# Patient Record
Sex: Female | Born: 1950
Health system: Southern US, Community
[De-identification: ages and names within clinical notes are randomized; demographics above are authoritative.]

## PROBLEM LIST (undated history)

## (undated) DIAGNOSIS — T8859XA Other complications of anesthesia, initial encounter: Secondary | ICD-10-CM

## (undated) DIAGNOSIS — IMO0002 Reserved for concepts with insufficient information to code with codable children: Secondary | ICD-10-CM

## (undated) DIAGNOSIS — J302 Other seasonal allergic rhinitis: Secondary | ICD-10-CM

## (undated) DIAGNOSIS — K219 Gastro-esophageal reflux disease without esophagitis: Secondary | ICD-10-CM

## (undated) DIAGNOSIS — S63509A Unspecified sprain of unspecified wrist, initial encounter: Secondary | ICD-10-CM

## (undated) DIAGNOSIS — E785 Hyperlipidemia, unspecified: Secondary | ICD-10-CM

## (undated) DIAGNOSIS — H269 Unspecified cataract: Secondary | ICD-10-CM

## (undated) DIAGNOSIS — G4730 Sleep apnea, unspecified: Secondary | ICD-10-CM

## (undated) DIAGNOSIS — M8580 Other specified disorders of bone density and structure, unspecified site: Secondary | ICD-10-CM

## (undated) DIAGNOSIS — M1990 Unspecified osteoarthritis, unspecified site: Secondary | ICD-10-CM

## (undated) DIAGNOSIS — E739 Lactose intolerance, unspecified: Secondary | ICD-10-CM

## (undated) DIAGNOSIS — Z86010 Personal history of colonic polyps: Secondary | ICD-10-CM

## (undated) DIAGNOSIS — T4145XA Adverse effect of unspecified anesthetic, initial encounter: Secondary | ICD-10-CM

## (undated) DIAGNOSIS — M069 Rheumatoid arthritis, unspecified: Secondary | ICD-10-CM

## (undated) HISTORY — DX: Sleep apnea, unspecified: G47.30

## (undated) HISTORY — DX: Unspecified sprain of unspecified wrist, initial encounter: S63.509A

## (undated) HISTORY — DX: Rheumatoid arthritis, unspecified: M06.9

## (undated) HISTORY — DX: Reserved for concepts with insufficient information to code with codable children: IMO0002

## (undated) HISTORY — DX: Hyperlipidemia, unspecified: E78.5

## (undated) HISTORY — DX: Unspecified cataract: H26.9

## (undated) HISTORY — DX: Gastro-esophageal reflux disease without esophagitis: K21.9

## (undated) HISTORY — PX: TOTAL ABDOMINAL HYSTERECTOMY W/ BILATERAL SALPINGOOPHORECTOMY: SHX83

## (undated) HISTORY — DX: Adverse effect of unspecified anesthetic, initial encounter: T41.45XA

## (undated) HISTORY — DX: Other seasonal allergic rhinitis: J30.2

## (undated) HISTORY — PX: COLONOSCOPY: SHX174

## (undated) HISTORY — DX: Other complications of anesthesia, initial encounter: T88.59XA

## (undated) HISTORY — DX: Lactose intolerance, unspecified: E73.9

## (undated) HISTORY — DX: Other specified disorders of bone density and structure, unspecified site: M85.80

## (undated) HISTORY — DX: Unspecified osteoarthritis, unspecified site: M19.90

## (undated) HISTORY — PX: CATARACT EXTRACTION: SUR2

## (undated) HISTORY — DX: Personal history of colonic polyps: Z86.010

---

## 1997-09-15 ENCOUNTER — Other Ambulatory Visit: Admission: RE | Admit: 1997-09-15 | Discharge: 1997-09-15 | Payer: Self-pay | Admitting: Obstetrics & Gynecology

## 1998-05-29 ENCOUNTER — Encounter: Admission: RE | Admit: 1998-05-29 | Discharge: 1998-07-24 | Payer: Self-pay | Admitting: Neurological Surgery

## 1999-04-10 ENCOUNTER — Other Ambulatory Visit: Admission: RE | Admit: 1999-04-10 | Discharge: 1999-04-10 | Payer: Self-pay | Admitting: Gynecology

## 2001-04-22 ENCOUNTER — Other Ambulatory Visit: Admission: RE | Admit: 2001-04-22 | Discharge: 2001-04-22 | Payer: Self-pay | Admitting: Gynecology

## 2001-11-25 ENCOUNTER — Encounter (INDEPENDENT_AMBULATORY_CARE_PROVIDER_SITE_OTHER): Payer: Self-pay

## 2001-11-25 ENCOUNTER — Inpatient Hospital Stay (HOSPITAL_COMMUNITY): Admission: RE | Admit: 2001-11-25 | Discharge: 2001-11-27 | Payer: Self-pay | Admitting: Gynecology

## 2004-06-04 ENCOUNTER — Other Ambulatory Visit: Admission: RE | Admit: 2004-06-04 | Discharge: 2004-06-04 | Payer: Self-pay | Admitting: Gynecology

## 2004-06-28 ENCOUNTER — Ambulatory Visit: Payer: Self-pay | Admitting: Family Medicine

## 2004-07-04 ENCOUNTER — Ambulatory Visit: Payer: Self-pay

## 2004-07-24 ENCOUNTER — Ambulatory Visit: Payer: Self-pay | Admitting: Internal Medicine

## 2004-07-31 ENCOUNTER — Ambulatory Visit: Payer: Self-pay | Admitting: Internal Medicine

## 2004-08-07 ENCOUNTER — Ambulatory Visit: Payer: Self-pay | Admitting: Internal Medicine

## 2005-11-05 ENCOUNTER — Encounter: Admission: RE | Admit: 2005-11-05 | Discharge: 2005-11-05 | Payer: Self-pay | Admitting: Orthopedic Surgery

## 2006-03-06 ENCOUNTER — Encounter: Admission: RE | Admit: 2006-03-06 | Discharge: 2006-03-06 | Payer: Self-pay | Admitting: Orthopedic Surgery

## 2007-04-23 ENCOUNTER — Ambulatory Visit: Payer: Self-pay | Admitting: Family Medicine

## 2007-04-23 DIAGNOSIS — M51379 Other intervertebral disc degeneration, lumbosacral region without mention of lumbar back pain or lower extremity pain: Secondary | ICD-10-CM | POA: Insufficient documentation

## 2007-04-23 DIAGNOSIS — E785 Hyperlipidemia, unspecified: Secondary | ICD-10-CM | POA: Insufficient documentation

## 2007-04-23 DIAGNOSIS — M1990 Unspecified osteoarthritis, unspecified site: Secondary | ICD-10-CM | POA: Insufficient documentation

## 2007-04-23 DIAGNOSIS — M5137 Other intervertebral disc degeneration, lumbosacral region: Secondary | ICD-10-CM | POA: Insufficient documentation

## 2007-04-24 ENCOUNTER — Telehealth (INDEPENDENT_AMBULATORY_CARE_PROVIDER_SITE_OTHER): Payer: Self-pay | Admitting: *Deleted

## 2007-04-27 ENCOUNTER — Encounter: Payer: Self-pay | Admitting: Family Medicine

## 2007-05-04 ENCOUNTER — Telehealth (INDEPENDENT_AMBULATORY_CARE_PROVIDER_SITE_OTHER): Payer: Self-pay | Admitting: *Deleted

## 2007-05-08 LAB — CONVERTED CEMR LAB
ALT: 18 units/L (ref 0–35)
AST: 23 units/L (ref 0–37)
Albumin: 4 g/dL (ref 3.5–5.2)
Alkaline Phosphatase: 58 units/L (ref 39–117)
Bilirubin, Direct: 0.1 mg/dL (ref 0.0–0.3)
Cholesterol: 249 mg/dL (ref 0–200)
Direct LDL: 123.9 mg/dL
HDL: 101.9 mg/dL (ref >39.0)
Total Bilirubin: 1 mg/dL (ref 0.3–1.2)
Total CHOL/HDL Ratio: 2.4
Total Protein: 7.2 g/dL (ref 6.0–8.3)
Triglycerides: 50 mg/dL (ref 0–149)
VLDL: 10 mg/dL (ref 0–40)

## 2007-05-12 ENCOUNTER — Telehealth (INDEPENDENT_AMBULATORY_CARE_PROVIDER_SITE_OTHER): Payer: Self-pay | Admitting: *Deleted

## 2007-05-20 ENCOUNTER — Encounter: Payer: Self-pay | Admitting: Family Medicine

## 2008-02-29 ENCOUNTER — Ambulatory Visit: Payer: Self-pay | Admitting: Family Medicine

## 2008-02-29 DIAGNOSIS — M549 Dorsalgia, unspecified: Secondary | ICD-10-CM | POA: Insufficient documentation

## 2008-02-29 LAB — CONVERTED CEMR LAB
Bilirubin Urine: NEGATIVE
Blood in Urine, dipstick: NEGATIVE
Glucose, Urine, Semiquant: NEGATIVE
Ketones, urine, test strip: NEGATIVE
Nitrite: NEGATIVE
Protein, U semiquant: NEGATIVE
Specific Gravity, Urine: 1.01
Urobilinogen, UA: 0.2
WBC Urine, dipstick: NEGATIVE
pH: 5

## 2009-04-11 LAB — HM PAP SMEAR

## 2009-04-11 LAB — HM MAMMOGRAPHY

## 2009-04-11 LAB — CONVERTED CEMR LAB: Pap Smear: NORMAL

## 2009-04-13 ENCOUNTER — Encounter: Payer: Self-pay | Admitting: Family Medicine

## 2009-04-18 ENCOUNTER — Ambulatory Visit: Payer: Self-pay | Admitting: Family Medicine

## 2009-04-18 DIAGNOSIS — R079 Chest pain, unspecified: Secondary | ICD-10-CM | POA: Insufficient documentation

## 2009-04-18 LAB — HM COLONOSCOPY

## 2009-04-19 LAB — CONVERTED CEMR LAB
ALT: 17 units/L (ref 0–35)
AST: 25 units/L (ref 0–37)
Albumin: 4.2 g/dL (ref 3.5–5.2)
Alkaline Phosphatase: 65 units/L (ref 39–117)
Bilirubin, Direct: 0.1 mg/dL (ref 0.0–0.3)
Total Bilirubin: 0.7 mg/dL (ref 0.3–1.2)
Total Protein: 7.5 g/dL (ref 6.0–8.3)

## 2009-08-16 ENCOUNTER — Telehealth (INDEPENDENT_AMBULATORY_CARE_PROVIDER_SITE_OTHER): Payer: Self-pay | Admitting: *Deleted

## 2009-08-18 ENCOUNTER — Ambulatory Visit: Payer: Self-pay | Admitting: Family Medicine

## 2009-08-18 LAB — CONVERTED CEMR LAB
ALT: 20 units/L (ref 0–35)
AST: 22 units/L (ref 0–37)
Albumin: 4.2 g/dL (ref 3.5–5.2)
Alkaline Phosphatase: 64 units/L (ref 39–117)
Bilirubin, Direct: 0.1 mg/dL (ref 0.0–0.3)
Cholesterol: 161 mg/dL (ref 0–200)
HDL: 88.2 mg/dL (ref >39.00)
LDL Cholesterol: 66 mg/dL (ref 0–99)
Total Bilirubin: 0.7 mg/dL (ref 0.3–1.2)
Total CHOL/HDL Ratio: 2
Total Protein: 7 g/dL (ref 6.0–8.3)
Triglycerides: 35 mg/dL (ref 0.0–149.0)
VLDL: 7 mg/dL (ref 0.0–40.0)

## 2009-08-23 ENCOUNTER — Telehealth (INDEPENDENT_AMBULATORY_CARE_PROVIDER_SITE_OTHER): Payer: Self-pay | Admitting: *Deleted

## 2009-08-30 ENCOUNTER — Telehealth (INDEPENDENT_AMBULATORY_CARE_PROVIDER_SITE_OTHER): Payer: Self-pay | Admitting: *Deleted

## 2010-01-23 ENCOUNTER — Ambulatory Visit: Payer: Self-pay | Admitting: Family Medicine

## 2010-01-23 DIAGNOSIS — J029 Acute pharyngitis, unspecified: Secondary | ICD-10-CM | POA: Insufficient documentation

## 2010-01-23 LAB — CONVERTED CEMR LAB: Rapid Strep: NEGATIVE

## 2010-04-10 NOTE — Progress Notes (Signed)
Summary: refill  Phone Note Refill Request Call back at Work Phone [phone] Message from:  Patient on August 23, 2009 9:14 AM  Refills Requested: Medication #1:  CRESTOR 20 MG TABS 1 by mouth at bedtime - due labs in 07/2009. patient out of med  - [facility]   Method Requested: Fax to [facility] Pharmacy Initial call taken by: Okey Regal Spring,  August 23, 2009 9:15 AM Caller: Patient    New/Updated Medications: CRESTOR 20 MG TABS (ROSUVASTATIN CALCIUM) 1 by mouth at bedtime. Prescriptions: CRESTOR 20 MG TABS (ROSUVASTATIN CALCIUM) 1 by mouth at bedtime.  #90 x 1   Entered by:   Army Fossa CMA   Authorized by:   Loreen Freud DO   Signed by:   Army Fossa CMA on 08/23/2009   Method used:   Electronically to        [id number]* (retail)       [address]       [place], [place]  [address]       Ph: [phone]       Fax: [phone]   RxID:   [id number]

## 2010-04-10 NOTE — Assessment & Plan Note (Signed)
Summary: arthris,cholestral//tl   Vital Signs:  Patient Profile:   60 Years Old Female Weight:      148 pounds Pulse rate:   68 / minute BP sitting:   110 / 74  (left arm)  Vitals Entered By: Doristine Devoid (April 23, 2007 11:13 AM)                 PCP:  Laury Axon  Chief Complaint:  discuss cholesterol and arthritis in hands.  History of Present Illness: Pt here to f/u cholesterol and arthritis. Pt with no complaints.  Pt was seeing Dr Greta Doom for gyn but he is getting ready to retire.     Current Allergies: ! PENICILLIN  Past Medical History:    DDD    Osteoarthritis----- ortho -DR Frederico Hamman    Hyperlipidemia  Past Surgical History:    Caesarean section x2    Hysterectomy (2003)--TAH BSO   Family History:    Family History Liver disease--NASH?         Family History of Arthritis    M-dementia    Family History Hypertension  Social History:    Occupation: decorate model homes    Married    Never Smoked    Alcohol use-yes    Drug use-no    Regular exercise-no   Risk Factors:  Tobacco use:  never Passive smoke exposure:  no Drug use:  no HIV high-risk behavior:  no Alcohol use:  yes    Type:  wine    Drinks per day:  <1    Has patient --       Felt need to cut down:  no       Been annoyed by complaints:  no       Felt guilty about drinking:  no       Needed eye opener in the morning:  no    Counseled to quit/cut down alcohol use:  no Exercise:  no Seatbelt use:  100 %  Family History Risk Factors:    Family History of MI in females < 96 years old:  no    Family History of MI in males < 50 years old:  no   Review of Systems      See HPI   Physical Exam  General:     Well-developed,well-nourished,in no acute distress; alert,appropriate and cooperative throughout examination Lungs:     Normal respiratory effort, chest expands symmetrically. Lungs are clear to auscultation, no crackles or wheezes. Heart:     Normal rate and regular  rhythm. S1 and S2 normal without gallop, murmur, click, rub or other extra sounds. Msk:     normal ROM, no joint swelling, no joint warmth, no redness over joints, no joint deformities, and joint tenderness.      Impression & Recommendations:  Problem # 1:  OSTEOARTHRITIS (ICD-715.90)  Her updated medication list for this problem includes:    Arthrotec 75 75-200 Mg-mcg Tabs (Diclofenac-misoprostol) .Marland Kitchen... 1 by mouth bid Discussed use of medications, application of heat or cold, and exercises.  schedule CPE  Problem # 2:  HYPERLIPIDEMIA (ICD-272.4)  Orders: Venipuncture ([address]) TLB-Lipid Panel (80061-LIPID) TLB-Hepatic/Liver Function Pnl (80076-HEPATIC)   Complete Medication List: 1)  Arthrotec 75 75-200 Mg-mcg Tabs (Diclofenac-misoprostol) .Marland Kitchen.. 1 by mouth bid   Patient Instructions: 1)  schedule cpe    Prescriptions: ARTHROTEC 75 75-200 MG-MCG  TABS (DICLOFENAC-MISOPROSTOL) 1 by mouth bid  #60 x 2   Entered and Authorized by:   Loreen Freud DO  Signed by:   Loreen Freud DO on 04/23/2007   Method used:   Electronically sent to ...       [id number]*       [address]       [place], [place]  [address]       Ph: [phone]       Fax: [phone]   RxID:   [id number]  ]  Past Medical History:    DDD    Osteoarthritis----- ortho -DR Frederico Hamman    Hyperlipidemia  Past Surgical History:    Caesarean section x2    Hysterectomy (2003)--TAH BSO

## 2010-04-10 NOTE — Medication Information (Signed)
[facility]: [facility]   Imported By: Freddy Jaksch 05/14/2007 11:09:40  _____________________________________________________________________  External Attachment:    Type:   Image     Comment:   External Document

## 2010-04-10 NOTE — Progress Notes (Signed)
Summary: REFILL ON CRESTOR FOR 3 MONTH SUPPLY  Phone Note Call from Patient Call back at [phone]   Caller: Patient Summary of Call: PATIENT CAME IN WITH A FORM FROM [facility] CRESTOR FOR 3 MONTH Initial call taken by: Freddy Jaksch,  August 30, 2009 1:52 PM  Follow-up for Phone Call        left message [facility]. Army Fossa CMA  August 30, 2009 2:12 PM     Prescriptions: CRESTOR 20 MG TABS (ROSUVASTATIN CALCIUM) 1 by mouth at bedtime.  #90 x 1   Entered by:   Army Fossa CMA   Authorized by:   Loreen Freud DO   Signed by:   Army Fossa CMA on 08/30/2009   Method used:   Faxed to ...       [facility])       [address] [address]       [place], [place]  [address]       Ph: [phone]       Fax: [phone]   RxID:   [id number]

## 2010-04-10 NOTE — Medication Information (Signed)
[facility]   Imported By: Freddy Jaksch 05/20/2007 10:59:24  _____________________________________________________________________  External Attachment:    Type:   Image     Comment:   External Document

## 2010-04-10 NOTE — Assessment & Plan Note (Signed)
Summary: acute lower back pain.cbs   Vital Signs:  Patient Profile:   60 Years Old Female Weight:      152 pounds Temp:     96.9 degrees F oral BP sitting:   108 / 70  (left arm)  Vitals Entered By: Doristine Devoid (February 29, 2008 10:56 AM)                 PCP:  Laury Axon  Chief Complaint:  lower back pain x2 days hx of DDD but feels different pain mostly around kidney area .  History of Present Illness: 60 yo woman w/ 'bad pain' in lower back for 2.5 days.  Has felt poorly for 2 weeks- when pt wakes up in AM feels like she hasn't slept at all.  Napping during the days.  No burning w/ urination, no hematuria, no urgency or frequency.  Pt w/ hx of DDD w/ low back pain constantly- this is paraspinal pain slightly higher (L1-2 area).  No radiation.  Recent lifting.  No trauma that pt can recall.  Pain is described as a burning, pain w/ bending forward.  Nothing relieves pain- has not been taking tylenol or motrin due to hx of GI issues in past.    Current Allergies (reviewed today): ! PENICILLIN     Review of Systems  The patient denies fever, incontinence, and muscle weakness.     Physical Exam  General:     Well-developed,well-nourished,in no acute distress; alert,appropriate and cooperative throughout examination Abdomen:     soft, NT/ND, + BS, no suprapubic or CVA tenderness Msk:     Paraspinal muscle spasm in lubar region, + TTP good flexion, extension, lateral bending and rotation (-) SLR Pulses:     +2 DP and PT Extremities:     No C/C/E    Impression & Recommendations:  Problem # 1:  BACK PAIN (ICD-724.5) Assessment: New Pt w/out signs of UTI, UA (-).  Pain consistent w/ paraspinal muscle spasm.  Flexeril and NSAIDs as needed.  Heat and massage.  Gave red flags that should prompt return.  Pt expresses understanding and is in agreement w/ this plan. Her updated medication list for this problem includes:    Arthrotec 75 75-200 Mg-mcg Tabs  (Diclofenac-misoprostol) .Marland Kitchen... 1 by mouth bid    Cyclobenzaprine Hcl 10 Mg Tabs (Cyclobenzaprine hcl) .Marland Kitchen... 1 tab by mouth by mouth q8 as needed muscle pain/spasm.  will make you drowsy  Orders: UA Dipstick w/o Micro (manual) ([address])   Complete Medication List: 1)  Arthrotec 75 75-200 Mg-mcg Tabs (Diclofenac-misoprostol) .Marland Kitchen.. 1 by mouth bid 2)  Cyclobenzaprine Hcl 10 Mg Tabs (Cyclobenzaprine hcl) .Marland Kitchen.. 1 tab by mouth by mouth q8 as needed muscle pain/spasm.  will make you drowsy   Patient Instructions: 1)  Follow up as needed 2)  Use the Cyclobenzaprine as needed for muscle spasm- will make you drowsy so no driving 3)  Heat and massage as needed for pain 4)  Have a wonderful holiday season!   Prescriptions: CYCLOBENZAPRINE HCL 10 MG TABS (CYCLOBENZAPRINE HCL) 1 tab by mouth by mouth Q8 as needed muscle pain/spasm.  Will make you drowsy  #45 x 0   Entered and Authorized by:   Neena Rhymes MD   Signed by:   Neena Rhymes MD on 02/29/2008   Method used:   Electronically to        [id number]* (retail)       [address]  [place], [place]  [address]       Ph: [phone]       Fax: [phone]   RxID:   [id number]  ] Laboratory Results   Urine Tests    Routine Urinalysis   Glucose: negative   (Normal Range: Negative) Bilirubin: negative   (Normal Range: Negative) Ketone: negative   (Normal Range: Negative) Spec. Gravity: 1.010   (Normal Range: 1.003-1.035) Blood: negative   (Normal Range: Negative) pH: 5.0   (Normal Range: 5.0-8.0) Protein: negative   (Normal Range: Negative) Urobilinogen: 0.2   (Normal Range: 0-1) Nitrite: negative   (Normal Range: Negative) Leukocyte Esterace: negative   (Normal Range: Negative)

## 2010-04-10 NOTE — Assessment & Plan Note (Signed)
Summary: TO DISCUSS HER LABS AND OTHER//PH   Vital Signs:  Patient profile:   60 year old female Height:      65 inches Weight:      155 pounds BMI:     25.89 Temp:     97.5 degrees F oral Pulse rate:   72 / minute Pulse rhythm:   regular BP sitting:   124 / 80  (left arm) Cuff size:   regular  Vitals Entered By: Army Fossa CMA (April 18, 2009 8:12 AM) CC: Discuss bloodwork (had copy with her)    History of Present Illness: Pt here to go over labs from gyn.  Pt concerned about her cholesterol and family hx NASH.  Pt is ready to go on meds if necessary.  Pt c/o occassional chest pain on left side.  No SOB.  It only last few seconds.  It's been a while since last episode.    Preventive Screening-Counseling & Management  Alcohol-Tobacco     Alcohol drinks/day: 1     Alcohol type: wine     Smoking Status: never     Passive Smoke Exposure: no  Caffeine-Diet-Exercise     Caffeine use/day: 2     Does Patient Exercise: no  Hep-HIV-STD-Contraception     HIV Risk: no  Safety-Violence-Falls     Seat Belt Use: 100      Sexual History:  currently monogamous.    Current Medications (verified): 1)  None  Allergies: 1)  ! Penicillin  Past History:  Family History: Last updated: 04/23/2007 Family History Liver disease--NASH?   Family History of Arthritis M-dementia Family History Hypertension  Social History: Last updated: 04/23/2007 Occupation: decorate model homes Married Never Smoked Alcohol use-yes Drug use-no Regular exercise-no  Risk Factors: Alcohol Use: 1 (04/18/2009) Caffeine Use: 2 (04/18/2009) Exercise: no (04/18/2009)  Risk Factors: Smoking Status: never (04/18/2009) Passive Smoke Exposure: no (04/18/2009)  Past medical, surgical, family and social histories (including risk factors) reviewed for relevance to current acute and chronic problems.  Past Medical History: Reviewed history from 04/23/2007 and no changes  required. DDD Osteoarthritis----- ortho -DR Frederico Hamman Hyperlipidemia  Past Surgical History: Reviewed history from 04/23/2007 and no changes required. TAH/BSO Caesarean section-X2  Family History: Reviewed history from 04/23/2007 and no changes required. Family History Liver disease--NASH?   Family History of Arthritis M-dementia Family History Hypertension  Social History: Reviewed history from 04/23/2007 and no changes required. Occupation: decorate model homes Married Never Smoked Alcohol use-yes Drug use-no Regular exercise-no Caffeine use/day:  2 Sexual History:  currently monogamous  Review of Systems      See HPI  Physical Exam  General:  Well-developed,well-nourished,in no acute distress; alert,appropriate and cooperative throughout examination Lungs:  Normal respiratory effort, chest expands symmetrically. Lungs are clear to auscultation, no crackles or wheezes. Heart:  normal rate and no murmur.   Extremities:  No clubbing, cyanosis, edema, or deformity noted with normal full range of motion of all joints.   Psych:  Oriented X3 and normally interactive.     Impression & Recommendations:  Problem # 1:  HYPERLIPIDEMIA (ICD-272.4)  Orders: Venipuncture ([address]) TLB-Hepatic/Liver Function Pnl (80076-HEPATIC) T-NMR, Lipoprofile ([address])  Labs Reviewed: SGOT: 23 (04/23/2007)   SGPT: 18 (04/23/2007)   HDL:101.9 (04/23/2007)  LDL:DEL (04/23/2007)  Chol:249 (04/23/2007)  Trig:50 (04/23/2007)  Problem # 2:  CHEST PAIN (ICD-786.50)  Orders: EKG w/ Interpretation ([address]) If pain returns go to ER  Colonoscopy Result Date:  04/28/2002 Colonoscopy Result:  normal Colonoscopy Next  Due:  10 yr PAP Result Date:  04/11/2009 PAP Result:  normal PAP Next Due:  3 yr Mammogram Result Date:  04/11/2009 Mammogram Result:  normal Mammogram Next Due:  1 yr     EKG  Procedure date:  04/18/2009  Findings:      Normal sinus rhythm with rate of:   79bpm

## 2010-04-10 NOTE — Letter (Signed)
Summary: Letter to Patient Regarding Lab Results/[facility]  Letter to Patient Regarding Lab Results/[facility]   Imported By: Lanelle Bal 04/21/2009 08:04:56  _____________________________________________________________________  External Attachment:    Type:   Image     Comment:   External Document

## 2010-04-10 NOTE — Progress Notes (Signed)
Summary: [facility]  Phone Note Outgoing Call   Call placed by: Army Fossa CMA,  August 16, 2009 1:19 PM Reason for Call: Confirm/change Appt Summary of Call: Pt needs to schedule labwork:  272.4  lipid, hep   Follow-up for Phone Call        lab appt scheduled [id number] .Marland KitchenOkey Regal Spring  August 16, 2009 4:00 PM

## 2010-04-10 NOTE — Assessment & Plan Note (Signed)
Summary: SORE THROAT//PH   Vital Signs:  Patient profile:   60 year old female Height:      65 inches Weight:      148.6 pounds BMI:     24.82 O2 Sat:      98 % on Room air Temp:     96.9 degrees F oral Pulse rate:   82 / minute Pulse rhythm:   regular BP sitting:   116 / 80  (left arm) Cuff size:   regular  Vitals Entered By: Almeta Monas CMA Duncan Dull) (January 23, 2010 11:24 AM)  O2 Flow:  Room air CC: c/o sore throat, headache, bilateral  ear pain, sinus drainage, cough and congestion x4days, URI symptoms   History of Present Illness:       This is a 60 year old woman who presents with URI symptoms.  The symptoms began 4 days ago.  Pt taking benadryl with no relief.  The patient complains of clear nasal discharge, dry cough, and earache, but denies nasal congestion, purulent nasal discharge, sore throat, and sick contacts.  The patient denies fever, low-grade fever (<100.5 degrees), fever of 100.5-103 degrees, fever of 103.1-104 degrees, fever to >104 degrees, stiff neck, dyspnea, wheezing, rash, vomiting, diarrhea, use of an antipyretic, and response to antipyretic.  The patient also reports itchy throat.  The patient denies itchy watery eyes, sneezing, seasonal symptoms, response to antihistamine, headache, muscle aches, and severe fatigue.  The patient denies the following risk factors for Strep sinusitis: unilateral facial pain, unilateral nasal discharge, poor response to decongestant, double sickening, tooth pain, Strep exposure, tender adenopathy, and absence of cough.    Current Medications (verified): 1)  Crestor 20 Mg Tabs (Rosuvastatin Calcium) .Marland Kitchen.. 1 By Mouth At Bedtime.  Allergies (verified): 1)  ! Penicillin  Past History:  Past Medical History: Last updated: 04/23/2007 DDD Osteoarthritis----- ortho -DR Reuel Boom  [place] Hyperlipidemia  Past Surgical History: Last updated: 04/23/2007 TAH/BSO Caesarean section-X2  Family History: Last updated:  04/23/2007 Family History Liver disease--NASH?   Family History of Arthritis M-dementia Family History Hypertension  Social History: Last updated: 04/23/2007 Occupation: decorate model homes Married Never Smoked Alcohol use-yes Drug use-no Regular exercise-no  Risk Factors: Alcohol Use: 1 (04/18/2009) Caffeine Use: 2 (04/18/2009) Exercise: no (04/18/2009)  Risk Factors: Smoking Status: never (04/18/2009) Passive Smoke Exposure: no (04/18/2009)  Family History: Reviewed history from 04/23/2007 and no changes required. Family History Liver disease--NASH?   Family History of Arthritis M-dementia Family History Hypertension  Social History: Reviewed history from 04/23/2007 and no changes required. 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  Otoscopic examination reveals clear canals, tympanic membranes are intact bilaterally without bulging, retraction, inflammation or discharge. Hearing is grossly normal bilaterally. Nose:  External nasal examination shows no deformity or inflammation. Nasal mucosa are pink and moist without lesions or exudates. Mouth:  pharyngeal erythema and postnasal drip.   Neck:  No deformities, masses, or tenderness noted. Lungs:  Normal respiratory effort, chest expands symmetrically. Lungs are clear to auscultation, no crackles or wheezes. Heart:  Normal rate and regular rhythm. S1 and S2 normal without gallop, murmur, click, rub or other extra sounds. Psych:  Cognition and judgment appear intact. Alert and cooperative with normal attention span and concentration. No apparent  delusions, illusions, hallucinations   Impression &  Recommendations:  Problem # 1:  ACUTE PHARYNGITIS (ICD-462) zyrtec daily  astepro  Orders: Rapid Strep ([address])  Complete Medication List: 1)  Crestor 20 Mg Tabs (Rosuvastatin calcium) .Marland Kitchen.. 1 by mouth at bedtime. 2)  Astepro 0.15 % Soln (Azelastine hcl) .... 2 sprays each nostril once daily 3)  Zyrtec Hives Relief 10 Mg Tabs (Cetirizine hcl) .Marland Kitchen.. 1 by mouth once daily as needed  Patient Instructions: 1)  Get plenty of rest, drink lots of clear liquids, and use Tylenol or Ibuprofen for fever and comfort. Return in 7-10 days if you're not better: sooner if you'er feeling worse.  2)  get zyrtec otc--1 by mouth once daily    Orders Added: 1)  Rapid Strep [[address]] 2)  Est. Patient Level III [[address]]    Laboratory Results    Other Tests  Rapid Strep: negative

## 2010-04-10 NOTE — Progress Notes (Signed)
Summary: med refill   Phone Note Refill Request   Refills Requested: Medication #1:  ARTHROTEC 75 75-200 MG-MCG  TABS 1 by mouth bid. via fax  [facility] [address]   Initial call taken by: Charolette Child,  April 24, 2007 10:08 AM      Prescriptions: ARTHROTEC 75 75-200 MG-MCG  TABS (DICLOFENAC-MISOPROSTOL) 1 by mouth bid  #60 x 2   Entered by:   Doristine Devoid   Authorized by:   Loreen Freud DO   Signed by:   Doristine Devoid on 04/24/2007   Method used:   Electronically sent to ...       [id number]*       [address]       [place], [place]  [address]       Ph: [phone]       Fax: [phone]   RxID:   [id number]

## 2010-07-27 NOTE — Op Note (Signed)
Stephanie [name], Stephanie [name]                          ACCOUNT NO.:  [id number]   MEDICAL RECORD NO.:  [id number]                   PATIENT TYPE:  INP   LOCATION:  9399                                 FACILITY:  [facility]   PHYSICIAN:  Luvenia Redden, M.D.                DATE OF BIRTH:  06-15-1950   DATE OF PROCEDURE:  05/25/2001  DATE OF DISCHARGE:                                 OPERATIVE REPORT   PREOPERATIVE DIAGNOSES:  1. Uterine fibroids.  2. Menorrhagia.  3. Chronic pelvic pain and dysmenorrhea.  4. Small right ovarian cyst.   POSTOPERATIVE DIAGNOSES:  1. Uterine fibroids.  2. Menorrhagia.  3. Chronic pelvic pain and dysmenorrhea.  4. Small right ovarian cyst.  5. Pelvic adhesions.   SURGEON:  Luvenia Redden, M.D.   ASSISTANT:  Randye Lobo, M.D.   OPERATION:  1. Total abdominal hysterectomy, bilateral salpingo-oophorectomy.  2. Lysis of pelvic adhesions.   ANESTHESIA:  Epidural and sedation.   PROCEDURE:  Under good epidural anesthesia patient was prepped and draped in  a sterile manner and Foley catheter placed in the bladder.  The abdomen was  entered through her old Pfannenstiel scar.  Bleeders were clamped and  cauterized as encountered.  Fascia was opened transversely, freed up from  the underlying muscle.  Rectus muscles were separated in the midline.  Peritoneum was entered vertically.  On entering the peritoneum upper  abdominal organs were palpated and were normal.  No masses were felt.  Pelvic organs were palpated.  The uterus was anterior, slightly irregular,  and slightly enlarged.  There was what appeared to be a corpus luteum cyst  present in the right ovary and the right ovary was freely movable.  The left  ovary was adherent to the pelvic side wall on the left side.  These  adhesions were lysed by blunt dissection.  There were also some adhesions  between the sigmoid colon and the peritoneum in the area of the left  infundibulopelvic ligament.  These were  lysed by sharp dissection.  Once a  retractor was placed and the intestines packed off in the upper abdomen with  wet lap packs, hysterectomy was undertaken.  Round ligaments were suture  ligated and divided and the bladder flap developed by sharp and blunt  dissection.  Ureters were identified on both sides well below the  infundibulopelvic ligaments.  The infundibulopelvic ligaments were isolated,  clamped, cut, and doubly ligated.  The broad ligaments were skeletonized and  then uterine vessels were clamped, cut, and ligated.  Bladder was further  dissected down off the anterior cervix.  Cardinal ligaments were clamped,  cut, and ligated.  Uterosacral ligaments were clamped, cut, and ligated.  The vagina was entered, the cervix circumcised, and the specimen removed.  The corners of the vaginal apices were secured with figure-of-eight sutures  and then the cuff was closed with figure-of-eight 0  suture.  Individual  bleeders along the bladder flap were electrocoagulated.  The area of the  pelvis was irrigated with warm saline and aspirated.  There was no active  bleeding.  Ureters were seen to function and there was clear urine coming  from the catheter.  After all the counts were correct the peritoneum was  closed with continuous 2-0 Monocryl.  The fascia was repaired with  continuous 0 Monocryl.  Subcutaneous tissues were approximated with  interrupted 2-0 plain catgut and the skin edges were closed with horizontal  mattress 4-0 silk sutures.  Dry, sterile dressing was applied.  Estimated  blood loss was 200 cc.  None was replaced.  The patient tolerated the  procedure and was removed to recovery in good condition.                                               Luvenia Redden, M.D.    SWB/MEDQ  D:  11/25/2001  T:  11/25/2001  Job:  [id number]

## 2010-07-27 NOTE — H&P (Signed)
Stephanie [name], Stephanie [name]                          ACCOUNT NO.:  [id number]   MEDICAL RECORD NO.:  [id number]                   PATIENT TYPE:  INP   LOCATION:  9399                                 FACILITY:  [facility]   PHYSICIAN:  Luvenia Redden, M.D.                DATE OF BIRTH:  [birth year]   DATE OF ADMISSION:  11/25/2001  DATE OF DISCHARGE:                                HISTORY & PHYSICAL   HISTORY OF PRESENT ILLNESS:  The patient is a 60 year old gravida 2, para 2,  admitted to the hospital because of complaints of heavy painful periods that  had become uncontrollable.  She also complaints of dyspareunia.  She has  some small fibroids on ultrasound and also a small ovarian cyst measuring 3  cm.  CA 125 is reported as 26.5.  Attempts to control her periods over the  years have been handled pretty well with oral contraceptives but in recent  months they have become heavier and heavier and she now has to spend at  least a day at home when having her period; some of that in bed.  She has  been discussed with options of treatment by treating her with possibly  Lupron and stopping her periods.  Endometrial ablation was discussed and  also hysterectomy was discussed.  The patient is now admitted for  hysterectomy with removal of her ovaries as well because she is age 47 and  she will be started on estrogen replacement therapy.  The patient  understands that she will no longer have periods and no longer able to carry  a child.  She understands the consequences of the procedure. She understands  the possibly complications of pelvic surgery including but not limited to  bleeding, infection, injury to her adjacent structures and the patient  agrees to undergo this procedure.   PAST MEDICAL HISTORY:  The patient had the usual childhood diseases.  She is  allergic to PENICILLIN.  She is taking no medications at the time of  admission other than calcium and a little bit of baby aspirin now and  then.  She has had two pregnancies which went to term.  They were delivered by C-  section.  She has also had tubal sterilization.  She has had no serious  medical illnesses and a history of a Bartholin abscess several years ago.  She is a nonsmoker.   FAMILY HISTORY:  There is no history of diabetes, tuberculosis or bleeding  disorders.  Her grandmother had breast cancer.  There is no history of  ovarian or bone cancer in her family.   SYSTEM REVIEW:  Ears, nose and throat: No complaints.  Cardiorespiratory:  No complaints.  GI:  No complaints.  GU:  See present illness.  Neuromuscular:  No complaints.   PHYSICAL EXAMINATION:  VITAL SIGNS:  The patient is a 60 year old female who  is 5 feet 4 inches.  Weighs 147 pounds.  Blood pressure 120/78.  Pulse is 70  and regular.  Respirations are 12 per minute.  HEENT:  Head and neck showed pupils equal, reactive to light and  accommodation. Ears, nose and throat: Normal thyroid.  Normal sinus.  HEART:  Regular rate and rhythm. No murmurs heard.  LUNGS:  Clear to auscultation and percussion.  BREASTS:  No palpable masses.  ABDOMEN:  No palpable masses.  She has a healed Pfannenstiel scar from her C-  sections.  SKIN:  Smooth and dry.  EXTREMITIES:  No deformities.  No limitation of motion.  She has palpable  peripheral pulses.  NEUROLOGICAL EXAM:  Physiologic.  PELVIC EXAM:  External genitalia is normal.  She has a small asymptomatic  right Bartholin cyst.  Cervix is smooth.  Uterus is anterior.  It is  irregular and approximately 10 to [redacted] weeks gestational size.  It is tender  to palpation.  There are no adnexal masses palpated.  Pelvis is free of  masses.  Rectal exam is negative for masses.  Hemoccult test is negative.   IMPRESSION:  1. Small uterine fibroids documented by ultrasound.  2. Small right ovarian cyst.  3. Chronic pelvic pain.  Dysmenorrhea.  Dyspareunia.  4. Menorrhagia.  5. PENICILLIN allergy.   PLAN OF MANAGEMENT:   The patient is brought into the hospital for a total  abdominal hysterectomy and bilateral salpingo-oophorectomy to be followed up  with replacement estrogen therapy.                                               Luvenia Redden, M.D.    SWB/MEDQ  D:  11/25/2001  T:  11/25/2001  Job:  [id number]

## 2010-07-27 NOTE — Discharge Summary (Signed)
Stephanie [name], Stephanie [name]                          ACCOUNT NO.:  [id number]   MEDICAL RECORD NO.:  [id number]                   PATIENT TYPE:  INP   LOCATION:  9314                                 FACILITY:  [facility]   PHYSICIAN:  Luvenia Redden, MD                  DATE OF BIRTH:  11-29-1950   DATE OF ADMISSION:  11/25/2001  DATE OF DISCHARGE:  11/27/2001                                 DISCHARGE SUMMARY   HISTORY:  The patient is a 60 year old female admitted to the hospital for a  complaint of heavy painful periods.  She has small fibroids and she also has  a small ovarian cyst.   PHYSICAL EXAMINATION:  PELVIC:  Pertinent findings on physical are limited  to the pelvis where she has a small, asymptomatic right Bartholin's cyst.  The cervix is smooth.  The uterus is anterior, irregular, and 10-[redacted] weeks  gestational size.  It is tender to palpation.  No adnexal masses may be  palpated.  RECTA:  Negative.   LABORATORY DATA:  Preoperative hemoglobin 12.5 with hematocrit 37.5, white  count and differential normal.  Postoperative hemoglobin 10.2 with  hematocrit 30.8.  Urinalysis was negative for a voided specimen.  A urine  pregnancy test preoperative was negative.   HOSPITAL COURSE:  The patient was taken to the operating room where a total  abdominal hysterectomy, bilateral salpingo-oophorectomy was done without  complications.  She did have some adhesions in the pelvis and these were  also lysed.  There were no complications during surgery.  Postoperatively  she did well.  She had some difficulty voiding after her catheter was  removed on the first postoperative day so the catheter was replaced.  She  complained of some pain from the catheter.  Urinalysis was done and was  normal.  September 19 she was afebrile.  She was ambulating and taking p.o.  nourishment.  She was discharged to her home with a urinary catheter in  place.  The patient was instructed to have limited activity, eat a  regular  diet, and do no driving.  She was taught on how to remove her catheter in  three days' time at home.  She was to call the office and report her voiding  habits once the catheter is removed.  She is to return to my office in three  weeks' time for follow-up care.   DISCHARGE MEDICATIONS:  1. Mepergan for pain.  2. Premarin 0.625 mg to be taken daily.  3. Septra DS b.i.d. for seven days to cover her catheter.  4. Restoril for sleep.   Examination of the tissue removed showed benign disease.  She had  adenomyosis and leiomyomata of the uterus.  She had an involuting corpus  luteum cyst in the right ovary and in the left ovary an endometriotic cyst.   FINAL DIAGNOSES:  1. Chronic pelvic pain, dysmenorrhea, and  dyspareunia.  2. Leiomyomata uteri.  3. Adenomyosis of the uterus.  4. Endometriotic cyst of the left ovary.  5. Involuting corpus luteum cyst of the right ovary.  6. Pelvic adhesions.  7. Postoperative urinary retention.    OPERATION:  1. Total abdominal hysterectomy, bilateral salpingo-oophorectomy.  2. Lysis of adhesions.  3. Postoperative insertion of urinary catheter for urinary retention.   CONDITION ON DISCHARGE:  Improved.                                               Luvenia Redden, MD    WSB/MEDQ  D:  01/08/2002  T:  01/08/2002  Job:  [id number]

## 2010-11-05 ENCOUNTER — Encounter: Payer: Self-pay | Admitting: Family Medicine

## 2010-11-06 ENCOUNTER — Ambulatory Visit (INDEPENDENT_AMBULATORY_CARE_PROVIDER_SITE_OTHER): Payer: BC Managed Care – PPO | Admitting: Family Medicine

## 2010-11-06 ENCOUNTER — Encounter: Payer: Self-pay | Admitting: Family Medicine

## 2010-11-06 VITALS — BP 114/72 | HR 79 | Temp 97.7°F | Ht 65.25 in | Wt 142.8 lb

## 2010-11-06 DIAGNOSIS — G4700 Insomnia, unspecified: Secondary | ICD-10-CM

## 2010-11-06 DIAGNOSIS — E785 Hyperlipidemia, unspecified: Secondary | ICD-10-CM

## 2010-11-06 DIAGNOSIS — Z0000 Encounter for general adult medical examination without abnormal findings: Secondary | ICD-10-CM

## 2010-11-06 LAB — CBC WITH DIFFERENTIAL/PLATELET
Basophils Absolute: 0 10*3/uL (ref 0.0–0.1)
Basophils Relative: 0.8 % (ref 0.0–3.0)
Eosinophils Absolute: 0.1 10*3/uL (ref 0.0–0.7)
Eosinophils Relative: 2 % (ref 0.0–5.0)
HCT: 40 % (ref 36.0–46.0)
Hemoglobin: 13.3 g/dL (ref 12.0–15.0)
Lymphocytes Relative: 27 % (ref 12.0–46.0)
Lymphs Abs: 1.7 10*3/uL (ref 0.7–4.0)
MCHC: 33.3 g/dL (ref 30.0–36.0)
MCV: 94.4 fl (ref 78.0–100.0)
Monocytes Absolute: 0.5 10*3/uL (ref 0.1–1.0)
Monocytes Relative: 8.2 % (ref 3.0–12.0)
Neutro Abs: 3.9 10*3/uL (ref 1.4–7.7)
Neutrophils Relative %: 62 % (ref 43.0–77.0)
Platelets: 219 10*3/uL (ref 150.0–400.0)
RBC: 4.24 Mil/uL (ref 3.87–5.11)
RDW: 13.1 % (ref 11.5–14.6)
WBC: 6.3 10*3/uL (ref 4.5–10.5)

## 2010-11-06 LAB — BASIC METABOLIC PANEL
BUN: 21 mg/dL (ref 6–23)
CO2: 27 mEq/L (ref 19–32)
Calcium: 9 mg/dL (ref 8.4–10.5)
Chloride: 108 mEq/L (ref 96–112)
Creatinine, Ser: 0.8 mg/dL (ref 0.4–1.2)
GFR: 79.27 mL/min (ref >60.00)
Glucose, Bld: 85 mg/dL (ref 70–99)
Potassium: 3.8 mEq/L (ref 3.5–5.1)
Sodium: 143 mEq/L (ref 135–145)

## 2010-11-06 LAB — HEPATIC FUNCTION PANEL
ALT: 15 U/L (ref 0–35)
AST: 20 U/L (ref 0–37)
Albumin: 4.2 g/dL (ref 3.5–5.2)
Alkaline Phosphatase: 59 U/L (ref 39–117)
Bilirubin, Direct: 0.1 mg/dL (ref 0.0–0.3)
Total Bilirubin: 0.5 mg/dL (ref 0.3–1.2)
Total Protein: 7 g/dL (ref 6.0–8.3)

## 2010-11-06 LAB — LIPID PANEL
Cholesterol: 242 mg/dL — ABNORMAL HIGH (ref 0–200)
HDL: 89.6 mg/dL (ref >39.00)
Total CHOL/HDL Ratio: 3
Triglycerides: 49 mg/dL (ref 0.0–149.0)
VLDL: 9.8 mg/dL (ref 0.0–40.0)

## 2010-11-06 LAB — LDL CHOLESTEROL, DIRECT: Direct LDL: 143.4 mg/dL

## 2010-11-06 LAB — TSH: TSH: 1.29 u[IU]/mL (ref 0.35–5.50)

## 2010-11-06 MED ORDER — ROSUVASTATIN CALCIUM 20 MG PO TABS: 20.0000 mg | ORAL_TABLET | Freq: Every day | ORAL | Status: DC

## 2010-11-06 MED ORDER — VALACYCLOVIR HCL 1 G PO TABS: 2000.0000 mg | ORAL_TABLET | Freq: Two times a day (BID) | ORAL | Status: AC

## 2010-11-06 MED ORDER — ZOLPIDEM TARTRATE 10 MG PO TABS: 10.0000 mg | ORAL_TABLET | Freq: Every evening | ORAL | Status: DC | PRN

## 2010-11-06 NOTE — Patient Instructions (Signed)

## 2010-11-06 NOTE — Progress Notes (Signed)
Subjective:     Stephanie [name] is a 60 y.o. female and is here for a comprehensive physical exam. The patient reports no problems.  History   Social History  . Marital Status: Married    Spouse Name: N/A    Number of Children: N/A  . Years of Education: N/A   Occupational History  . Not on file.   Social History Main Topics  . Smoking status: Never Smoker   . Smokeless tobacco: Not on file  . Alcohol Use: Yes  . Drug Use: No  . Sexually Active:    Other Topics Concern  . Not on file   Social History Narrative  . No narrative on file   Health Maintenance  Topic Date Due  . Influenza Vaccine  12/10/2010  . Mammogram  04/12/2011  . Pap Smear  04/11/2012  . Tetanus/tdap  06/29/2014  . Colonoscopy  04/19/2019    The following portions of the patient's history were reviewed and updated as appropriate: allergies, current medications, past family history, past medical history, past social history, past surgical history and problem list.  Review of Systems Review of Systems  Constitutional: Negative for activity change, appetite change and fatigue.  HENT: Negative for hearing loss, congestion, tinnitus and ear discharge.  dentist q31m Eyes: Negative for visual disturbance (see optho q1y -- vision corrected to 20/20 with glasses).  Respiratory: Negative for cough, chest tightness and shortness of breath.   Cardiovascular: Negative for chest pain, palpitations and leg swelling.  Gastrointestinal: Negative for abdominal pain, diarrhea, constipation and abdominal distention.  Genitourinary: Negative for urgency, frequency, decreased urine volume and difficulty urinating.  Musculoskeletal: Negative for back pain, arthralgias and gait problem.  Skin: Negative for color change, pallor and rash.  Neurological: Negative for dizziness, light-headedness, numbness and headaches.  Hematological: Negative for adenopathy. Does not bruise/bleed easily.  Psychiatric/Behavioral: Negative for suicidal  ideas, confusion, sleep disturbance, self-injury, dysphoric mood, decreased concentration and agitation.       Objective:    BP 114/72  Pulse 79  Temp(Src) 97.7 F (36.5 C) (Oral)  Ht 5' 5.25" (1.657 m)  Wt 142 lb 12.8 oz (64.774 kg)  BMI 23.58 kg/m2  SpO2 99%  General Appearance:    Alert, cooperative, no distress, appears stated age  Head:    Normocephalic, without obvious abnormality, atraumatic  Eyes:    PERRL, conjunctiva/corneas clear, EOM's intact, fundi    benign, both eyes  Ears:    Normal TM's and external ear canals, both ears  Nose:   Nares normal, septum midline, mucosa normal, no drainage    or sinus tenderness  Throat:   Lips, mucosa, and tongue normal; teeth and gums normal  Neck:   Supple, symmetrical, trachea midline, no adenopathy;    thyroid:  no enlargement/tenderness/nodules; no carotid   bruit or JVD  Back:     Symmetric, no curvature, ROM normal, no CVA tenderness  Lungs:     Clear to auscultation bilaterally, respirations unlabored  Chest Wall:    No tenderness or deformity   Heart:    Regular rate and rhythm, S1 and S2 normal, no murmur, rub   or gallop  Breast Exam:    No dimpling, no axillary nodes no masses palpated  Abdomen:     Soft, non-tender, bowel sounds active all four quadrants,    no masses, no organomegaly  Genitalia:   gyn  Rectal:    gyn  Extremities:   Extremities normal, atraumatic, no cyanosis or edema  Pulses:   2+ and symmetric all extremities  Skin:   Skin color, texture, turgor normal, no rashes or lesions  Lymph nodes:   Cervical, supraclavicular, and axillary nodes normal  Neurologic:   CNII-XII intact, normal strength, sensation and reflexes    throughout    Assessment:    Healthy female exam.   hyperlipidemia--- off meds 3 months, check labs   Insomnia-- refill ambien Plan:  Check fasting labs ghm utd Check mammogram----pap per gyn    See After Visit Summary for Counseling Recommendations

## 2011-10-15 ENCOUNTER — Ambulatory Visit (INDEPENDENT_AMBULATORY_CARE_PROVIDER_SITE_OTHER): Payer: BC Managed Care – PPO | Admitting: Family Medicine

## 2011-10-15 ENCOUNTER — Encounter: Payer: Self-pay | Admitting: Family Medicine

## 2011-10-15 VITALS — BP 116/70 | HR 68 | Temp 97.7°F | Wt 151.2 lb

## 2011-10-15 DIAGNOSIS — Z0000 Encounter for general adult medical examination without abnormal findings: Secondary | ICD-10-CM

## 2011-10-15 DIAGNOSIS — E785 Hyperlipidemia, unspecified: Secondary | ICD-10-CM

## 2011-10-15 DIAGNOSIS — G4700 Insomnia, unspecified: Secondary | ICD-10-CM

## 2011-10-15 LAB — BASIC METABOLIC PANEL
BUN: 16 mg/dL (ref 6–23)
CO2: 27 mEq/L (ref 19–32)
Calcium: 9.3 mg/dL (ref 8.4–10.5)
Chloride: 104 mEq/L (ref 96–112)
Creatinine, Ser: 0.7 mg/dL (ref 0.4–1.2)
GFR: 98.96 mL/min (ref >60.00)
Glucose, Bld: 86 mg/dL (ref 70–99)
Potassium: 4.2 mEq/L (ref 3.5–5.1)
Sodium: 140 mEq/L (ref 135–145)

## 2011-10-15 LAB — LIPID PANEL
Cholesterol: 202 mg/dL — ABNORMAL HIGH (ref 0–200)
HDL: 93 mg/dL (ref >39.00)
Total CHOL/HDL Ratio: 2
Triglycerides: 59 mg/dL (ref 0.0–149.0)
VLDL: 11.8 mg/dL (ref 0.0–40.0)

## 2011-10-15 LAB — CBC WITH DIFFERENTIAL/PLATELET
Basophils Absolute: 0 10*3/uL (ref 0.0–0.1)
Basophils Relative: 0.6 % (ref 0.0–3.0)
Eosinophils Absolute: 0.1 10*3/uL (ref 0.0–0.7)
Eosinophils Relative: 2.3 % (ref 0.0–5.0)
HCT: 41.8 % (ref 36.0–46.0)
Hemoglobin: 14 g/dL (ref 12.0–15.0)
Lymphocytes Relative: 16.2 % (ref 12.0–46.0)
Lymphs Abs: 1 10*3/uL (ref 0.7–4.0)
MCHC: 33.4 g/dL (ref 30.0–36.0)
MCV: 92.4 fl (ref 78.0–100.0)
Monocytes Absolute: 0.3 10*3/uL (ref 0.1–1.0)
Monocytes Relative: 5.8 % (ref 3.0–12.0)
Neutro Abs: 4.4 10*3/uL (ref 1.4–7.7)
Neutrophils Relative %: 75.1 % (ref 43.0–77.0)
Platelets: 180 10*3/uL (ref 150.0–400.0)
RBC: 4.52 Mil/uL (ref 3.87–5.11)
RDW: 13.2 % (ref 11.5–14.6)
WBC: 5.9 10*3/uL (ref 4.5–10.5)

## 2011-10-15 LAB — HEPATIC FUNCTION PANEL
ALT: 23 U/L (ref 0–35)
AST: 27 U/L (ref 0–37)
Albumin: 4.1 g/dL (ref 3.5–5.2)
Alkaline Phosphatase: 59 U/L (ref 39–117)
Bilirubin, Direct: 0.1 mg/dL (ref 0.0–0.3)
Total Bilirubin: 0.9 mg/dL (ref 0.3–1.2)
Total Protein: 7.2 g/dL (ref 6.0–8.3)

## 2011-10-15 LAB — POCT URINALYSIS DIPSTICK
Bilirubin, UA: NEGATIVE
Blood, UA: NEGATIVE
Glucose, UA: NEGATIVE
Ketones, UA: NEGATIVE
Leukocytes, UA: NEGATIVE
Nitrite, UA: NEGATIVE
Protein, UA: NEGATIVE
Spec Grav, UA: 1.025
Urobilinogen, UA: 0.2
pH, UA: 6

## 2011-10-15 LAB — LDL CHOLESTEROL, DIRECT: Direct LDL: 103.2 mg/dL

## 2011-10-15 LAB — TSH: TSH: 1.83 u[IU]/mL (ref 0.35–5.50)

## 2011-10-15 MED ORDER — ROSUVASTATIN CALCIUM 20 MG PO TABS: 20.0000 mg | ORAL_TABLET | Freq: Every day | ORAL | Status: DC

## 2011-10-15 MED ORDER — ZOLPIDEM TARTRATE 10 MG PO TABS: 10.0000 mg | ORAL_TABLET | Freq: Every evening | ORAL | Status: DC | PRN

## 2011-10-15 NOTE — Progress Notes (Signed)
  Subjective:    Stephanie [name] is a 61 y.o. female here for follow up of dyslipidemia. The patient does not use medications that may worsen dyslipidemias (corticosteroids, progestins, anabolic steroids, diuretics, beta-blockers, amiodarone, cyclosporine, olanzapine). The patient exercises occasionally.---yard work, walks dog, hike The patient is not known to have coexisting coronary artery disease.   Cardiac Risk Factors Age > 45-female, > 55-female:  YES  +1  Smoking:   NO  Sig. family hx of CHD*:  NO  Hypertension:   NO  Diabetes:   NO  HDL < 35:   NO  HDL > 59:   YES  -1  Total: 0   *- Sig. family h/o CHD per NCEP = MI or sudden death at <55yo in  father or other 1st-degree female relative, or <65yo in mother or  other 1st-degree female relative  The following portions of the patient's history were reviewed and updated as appropriate: allergies, current medications, past family history, past medical history, past social history, past surgical history and problem list.  Review of Systems Pertinent items are noted in HPI.    Objective:    BP 116/70  Pulse 68  Temp 97.7 F (36.5 C) (Oral)  Wt 151 lb 3.2 oz (68.584 kg)  SpO2 98% General appearance: alert, cooperative, appears stated age and no distress Lungs: clear to auscultation bilaterally Heart: regular rate and rhythm, S1, S2 normal, no murmur, click, rub or gallop Extremities: extremities normal, atraumatic, no cyanosis or edema  Lab Review Lab Results  Component Value Date   CHOL 242* 11/06/2010   CHOL 161 08/18/2009   CHOL 249* 04/23/2007   HDL 89.60 11/06/2010   HDL 19.14 08/18/2009   HDL 782.9 04/23/2007   LDLDIRECT 143.4 11/06/2010   LDLDIRECT 123.9 04/23/2007      Assessment:    Dyslipidemia as detailed above with 0 CHD risk factors using NCEP scheme above.  Target levels for LDL are: < 100 mg/dl (CHD or "CHD risk equivalent" is present)  Explained to the patient the respective contributions of genetics, diet, and  exercise to lipid levels and the use of medication in severe cases which do not respond to lifestyle alteration. The patient's interest and motivation in making lifestyle changes seems good.    Plan:    The following changes are planned for the next 3 months, at which time the patient will return for repeat fasting lipids:  1. Dietary changes: Increase soluble fiber Plant sterols 2grams per day (e.g. Benecol): yes Reduce saturated fat, "trans" monounsaturated fatty acids, and cholesterol 2. Exercise changes:  Hiking, walking, yardwork:   3. Other treatment: None 4. Lipid-lowering medications: crestor  (Recommended by NCEP after 3-6 mos of dietary therapy & lifestyle modification,  except if CHD is present or LDL well above 190.) 5. Hormone replacement therapy (patient is a postmenopausal  woman): no 6. Screening for secondary causes of dyslipidemias: None indicated 7. Lipid screening for relatives: na 8. Follow up: 3 months.

## 2011-10-15 NOTE — Patient Instructions (Signed)

## 2011-10-21 ENCOUNTER — Encounter: Payer: Self-pay | Admitting: *Deleted

## 2011-12-06 ENCOUNTER — Encounter: Payer: BC Managed Care – PPO | Admitting: Family Medicine

## 2011-12-06 DIAGNOSIS — Z0289 Encounter for other administrative examinations: Secondary | ICD-10-CM

## 2012-05-11 ENCOUNTER — Encounter: Payer: Self-pay | Admitting: Lab

## 2012-05-12 ENCOUNTER — Ambulatory Visit (INDEPENDENT_AMBULATORY_CARE_PROVIDER_SITE_OTHER): Payer: BC Managed Care – PPO | Admitting: Family Medicine

## 2012-05-12 ENCOUNTER — Ambulatory Visit: Payer: BC Managed Care – PPO | Admitting: Family Medicine

## 2012-05-12 ENCOUNTER — Encounter: Payer: Self-pay | Admitting: Family Medicine

## 2012-05-12 VITALS — BP 116/74 | HR 80 | Temp 98.2°F | Wt 159.2 lb

## 2012-05-12 DIAGNOSIS — R143 Flatulence: Secondary | ICD-10-CM

## 2012-05-12 DIAGNOSIS — E785 Hyperlipidemia, unspecified: Secondary | ICD-10-CM

## 2012-05-12 DIAGNOSIS — R109 Unspecified abdominal pain: Secondary | ICD-10-CM

## 2012-05-12 DIAGNOSIS — R140 Abdominal distension (gaseous): Secondary | ICD-10-CM

## 2012-05-12 DIAGNOSIS — R141 Gas pain: Secondary | ICD-10-CM

## 2012-05-12 DIAGNOSIS — Z23 Encounter for immunization: Secondary | ICD-10-CM

## 2012-05-12 DIAGNOSIS — Z2911 Encounter for prophylactic immunotherapy for respiratory syncytial virus (RSV): Secondary | ICD-10-CM

## 2012-05-12 LAB — CBC WITH DIFFERENTIAL/PLATELET
Basophils Absolute: 0 10*3/uL (ref 0.0–0.1)
Basophils Relative: 0.6 % (ref 0.0–3.0)
Eosinophils Absolute: 0 10*3/uL (ref 0.0–0.7)
Eosinophils Relative: 0.9 % (ref 0.0–5.0)
HCT: 38.7 % (ref 36.0–46.0)
Hemoglobin: 12.9 g/dL (ref 12.0–15.0)
Lymphocytes Relative: 35.5 % (ref 12.0–46.0)
Lymphs Abs: 2 10*3/uL (ref 0.7–4.0)
MCHC: 33.3 g/dL (ref 30.0–36.0)
MCV: 90.2 fl (ref 78.0–100.0)
Monocytes Absolute: 0.5 10*3/uL (ref 0.1–1.0)
Monocytes Relative: 9.3 % (ref 3.0–12.0)
Neutro Abs: 3 10*3/uL (ref 1.4–7.7)
Neutrophils Relative %: 53.7 % (ref 43.0–77.0)
Platelets: 187 10*3/uL (ref 150.0–400.0)
RBC: 4.29 Mil/uL (ref 3.87–5.11)
RDW: 12.8 % (ref 11.5–14.6)
WBC: 5.5 10*3/uL (ref 4.5–10.5)

## 2012-05-12 LAB — LIPID PANEL
Cholesterol: 162 mg/dL (ref 0–200)
HDL: 87.1 mg/dL (ref >39.00)
LDL Cholesterol: 67 mg/dL (ref 0–99)
Total CHOL/HDL Ratio: 2
Triglycerides: 42 mg/dL (ref 0.0–149.0)
VLDL: 8.4 mg/dL (ref 0.0–40.0)

## 2012-05-12 LAB — HEPATIC FUNCTION PANEL
ALT: 21 U/L (ref 0–35)
AST: 28 U/L (ref 0–37)
Albumin: 3.9 g/dL (ref 3.5–5.2)
Alkaline Phosphatase: 52 U/L (ref 39–117)
Bilirubin, Direct: 0.1 mg/dL (ref 0.0–0.3)
Total Bilirubin: 0.7 mg/dL (ref 0.3–1.2)
Total Protein: 7.1 g/dL (ref 6.0–8.3)

## 2012-05-12 LAB — BASIC METABOLIC PANEL
BUN: 11 mg/dL (ref 6–23)
CO2: 26 mEq/L (ref 19–32)
Calcium: 9.3 mg/dL (ref 8.4–10.5)
Chloride: 107 mEq/L (ref 96–112)
Creatinine, Ser: 0.8 mg/dL (ref 0.4–1.2)
GFR: 75.54 mL/min (ref >60.00)
Glucose, Bld: 91 mg/dL (ref 70–99)
Potassium: 3.7 mEq/L (ref 3.5–5.1)
Sodium: 140 mEq/L (ref 135–145)

## 2012-05-12 LAB — H. PYLORI ANTIBODY, IGG: H Pylori IgG: NEGATIVE

## 2012-05-12 NOTE — Progress Notes (Signed)
  Subjective:     Stephanie [name] is a 62 y.o. female who presents for evaluation of abdominal pain. Onset was 3 months ago. Symptoms have been unchanged. The pain is described as aching and colicky,.. Pain is located in the periumbilical region, LLQ and RLQ without radiation.  Aggravating factors: eating.  Alleviating factors: none. Associated symptoms: belching and flatus. The patient denies anorexia, arthralagias, chills, constipation, diarrhea, dysuria, fever, frequency, headache, hematochezia, hematuria, melena, myalgias, nausea, sweats and vomiting.  Pt is also concerned about about arthritis pains and is asking about otc meds.  She doesn't take anything. And she has a ho hemorrhoids and otc is not helping.  She does not really want to deal with this today but wanted [place] to know.  They come and go.    The patient's history has been marked as reviewed and updated as appropriate.  Review of Systems Pertinent items are noted in HPI.     Objective:    BP 116/74  Pulse 80  Temp(Src) 98.2 F (36.8 C) (Oral)  Wt 159 lb 3.2 oz (72.213 kg)  BMI 26.3 kg/m2  SpO2 99% General appearance: alert, cooperative, appears stated age and no distress Abdomen: soft, non-tender; bowel sounds normal; no masses,  no organomegaly    Assessment:    Abdominal pain, likely secondary to IBS .   ho hemorrhoids---she will try otc first  Arthritis --can use Aleve otc Plan:    The diagnosis was discussed with the patient and evaluation and treatment plans outlined. See orders for lab and imaging studies. Further follow-up plans will be based on outcome of lab/imaging studies; see orders. refer to GI  Align and prilosec otc

## 2012-05-12 NOTE — Patient Instructions (Signed)
Irritable Bowel Syndrome  Irritable Bowel Syndrome (IBS) is caused by a disturbance of normal bowel function. Other terms used are spastic colon, mucous colitis, and irritable colon. It does not require surgery, nor does it lead to cancer. There is no cure for IBS. But with proper diet, stress reduction, and medication, you will find that your problems (symptoms) will gradually disappear or improve. IBS is a common digestive disorder. It usually appears in late adolescence or early adulthood. Women develop it twice as often as men.  CAUSES   After food has been digested and absorbed in the small intestine, waste material is moved into the colon (large intestine). In the colon, water and salts are absorbed from the undigested products coming from the small intestine. The remaining residue, or fecal material, is held for elimination. Under normal circumstances, gentle, rhythmic contractions on the bowel walls push the fecal material along the colon towards the rectum. In IBS, however, these contractions are irregular and poorly coordinated. The fecal material is either retained too long, resulting in constipation, or expelled too soon, producing diarrhea.  SYMPTOMS   The most common symptom of IBS is pain. It is typically in the lower left side of the belly (abdomen). But it may occur anywhere in the abdomen. It can be felt as heartburn, backache, or even as a dull pain in the arms or shoulders. The pain comes from excessive bowel-muscle spasms and from the buildup of gas and fecal material in the colon. This pain:   Can range from sharp belly (abdominal) cramps to a dull, continuous ache.   Usually worsens soon after eating.   Is typically relieved by having a bowel movement or passing gas.  Abdominal pain is usually accompanied by constipation. But it may also produce diarrhea. The diarrhea typically occurs right after a meal or upon arising in the morning. The stools are typically soft and watery. They are often  flecked with secretions (mucus).  Other symptoms of IBS include:   Bloating.   Loss of appetite.   Heartburn.   Feeling sick to your stomach (nausea).   Belching   Vomiting   Gas.  IBS may also cause a number of symptoms that are unrelated to the digestive system:   Fatigue.   Headaches.   Anxiety   Shortness of breath   Difficulty in concentrating.   Dizziness.  These symptoms tend to come and go.  DIAGNOSIS   The symptoms of IBS closely mimic the symptoms of other, more serious digestive disorders. So your caregiver may wish to perform a variety of additional tests to exclude these disorders. He/she wants to be certain of learning what is wrong (diagnosis). The nature and purpose of each test will be explained to you.  TREATMENT  A number of medications are available to help correct bowel function and/or relieve bowel spasms and abdominal pain. Among the drugs available are:   Mild, non-irritating laxatives for severe constipation and to help restore normal bowel habits.   Specific anti-diarrheal medications to treat severe or prolonged diarrhea.   Anti-spasmodic agents to relieve intestinal cramps.   Your caregiver may also decide to treat you with a mild tranquilizer or sedative during unusually stressful periods in your life.  The important thing to remember is that if any drug is prescribed for you, make sure that you take it exactly as directed. Make sure that your caregiver knows how well it worked for you.  HOME CARE INSTRUCTIONS    Avoid foods that   are high in fat or oils. Some examples are:heavy cream, butter, frankfurters, sausage, and other fatty meats.   Avoid foods that have a laxative effect, such as fruit, fruit juice, and dairy products.   Cut out carbonated drinks, chewing gum, and "gassy" foods, such as beans and cabbage. This may help relieve bloating and belching.   Bran taken with plenty of liquids may help relieve constipation.   Keep track of what foods seem to trigger  your symptoms.   Avoid emotionally charged situations or circumstances that produce anxiety.   Start or continue exercising.   Get plenty of rest and sleep.  MAKE SURE YOU:    Understand these instructions.   Will watch your condition.   Will get help right away if you are not doing well or get worse.  Document Released: 02/25/2005 Document Revised: 05/20/2011 Document Reviewed: 10/16/2007  [employer] Patient Information 2013 [employer]

## 2012-05-18 ENCOUNTER — Encounter: Payer: Self-pay | Admitting: Internal Medicine

## 2012-06-09 ENCOUNTER — Encounter: Payer: Self-pay | Admitting: Internal Medicine

## 2012-06-09 ENCOUNTER — Ambulatory Visit (INDEPENDENT_AMBULATORY_CARE_PROVIDER_SITE_OTHER): Payer: BC Managed Care – PPO | Admitting: Internal Medicine

## 2012-06-09 VITALS — BP 122/80 | HR 80 | Ht 63.5 in | Wt 157.0 lb

## 2012-06-09 DIAGNOSIS — R141 Gas pain: Secondary | ICD-10-CM

## 2012-06-09 DIAGNOSIS — R142 Eructation: Secondary | ICD-10-CM

## 2012-06-09 DIAGNOSIS — R143 Flatulence: Secondary | ICD-10-CM

## 2012-06-09 DIAGNOSIS — K648 Other hemorrhoids: Secondary | ICD-10-CM

## 2012-06-09 MED ORDER — HYDROCORTISONE ACETATE 25 MG RE SUPP: 25.0000 mg | Freq: Every day | RECTAL | Status: DC

## 2012-06-09 NOTE — Progress Notes (Signed)
Subjective:    Patient ID: Stephanie [name], female    DOB: 1950-06-28, 62 y.o.   MRN: [id number]  HPI This is a very pleasant woman known from prior screening colonoscopy in 2006. She is here because of increased gas and flatulence. She knows she is lactose intolerant and that responds to Lactaid. She eats a diet heavy in vegetables, all kinds, and wants to continue. However she has been having problems with flatulence and some mild bloating and cramping. Eats a fair amount of beans also. Meat is eaten but not frequently.  She also reports intermittent burning and itching of anal area with some post-defecation pain as well. Nothing severe but it is bothersome. Tried OTC hemorrhoid cream recently and it caused significant burning. Denies bleeding.  Bowel habits are regular. GI ROS otherwise negative.  Allergies  Allergen Reactions  . Penicillins    Outpatient Prescriptions Prior to Visit  Medication Sig Dispense Refill  . rosuvastatin (CRESTOR) 20 MG tablet Take 1 tablet (20 mg total) by mouth daily.  90 tablet  3  . zolpidem (AMBIEN) 10 MG tablet Take 1 tablet (10 mg total) by mouth at bedtime as needed.  30 tablet  0   No facility-administered medications prior to visit.   Past Medical History  Diagnosis Date  . DDD (degenerative disc disease)   . Hyperlipidemia   . Arthritis    Past Surgical History  Procedure Laterality Date  . Total abdominal hysterectomy w/ bilateral salpingoophorectomy    . Cesarean section      x 2  . Colonoscopy     History   Social History  . Marital Status: Married    Spouse Name: N/A    Number of Children: 2       Occupational History  . self    Social History Main Topics  . Smoking status: Never Smoker   . Smokeless tobacco: Never Used  . Alcohol Use: 3.5 oz/week    7 drink(s) per week  . Drug Use: No  .     Other Topics Concern  . None   Social History Narrative   Married, 1 son and 1 daughter   Self-employed   2 caffienated  beverages/day   Family History  Problem Relation Age of Onset  . Hypertension Mother   . Dementia Mother   . Arthritis Mother   . Arthritis Father   . Liver disease Father     NASH  . Arthritis Sister   . Arthritis Brother   . Liver disease Paternal Aunt   . Liver disease Paternal Aunt   . Liver disease Paternal Aunt   . Cystic fibrosis Other     niece  . 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 Also w/ back pain. All other ROS negative     Objective:   Physical Exam General:  Well-developed, well-nourished and in no acute distress Eyes:  anicteric. ENT:   Mouth and posterior pharynx free of lesions.  Neck:   supple w/o thyromegaly or mass.  Lungs: Clear to auscultation bilaterally. Heart:  S1S2, no rubs, murmurs, gallops. Abdomen:  soft, non-tender, no hepatosplenomegaly, hernia, or mass and BS+.  Rectal: Female staff present - small posterior tag, no perianal dermatitis. Not tender and no mass. Anoscopy: Small internal hemorrhoids, no fissure Lymph:  no cervical or supraclavicular adenopathy. Extremities:   no edema in lower extremities - DIP and  PIP joints are mildly swollen Skin   no rash. Neuro:  A&O x 3.  Psych:  appropriate mood and  Affect.   Data Reviewed: 2006 colonoscopy    Lab Results  Component Value Date   WBC 5.5 05/12/2012   HGB 12.9 05/12/2012   HCT 38.7 05/12/2012   MCV 90.2 05/12/2012   PLT 187.0 05/12/2012     Assessment & Plan:  Flatulence - probably from diet more than anything  Internal hemorrhoids with symptoms  1. Trial of Align daily 2. Review FODMAPS and gas and flatulence prevention diets and try to modify diet some, if she can 3. HC suppositories for hemorrhoids - if fail to hel consider diltiazem gel (? Occult fissure) 4.   Consider trying Beano 5.   REV 6-8 weeks   I appreciate the opportunity to care for this patient.

## 2012-06-09 NOTE — Patient Instructions (Addendum)
Please pick up the hemorrhoid suppositories at your pharmacy. Start taking Align probiotic 1 each day (this is over the counter) Read the information about gas and FODMAPS and consider dietary changes. Gas ex (simethicone) is another option.  Let me know by phone or My Chart if the suppositories are not helping.  I will see you in 6-8 weeks.  Thank you for choosing me and [facility].  Iva Boop, MD, [facility]

## 2012-06-15 ENCOUNTER — Encounter: Payer: Self-pay | Admitting: Family Medicine

## 2012-10-16 ENCOUNTER — Encounter: Payer: Self-pay | Admitting: Family Medicine

## 2012-10-16 ENCOUNTER — Ambulatory Visit (INDEPENDENT_AMBULATORY_CARE_PROVIDER_SITE_OTHER): Payer: BC Managed Care – PPO | Admitting: Family Medicine

## 2012-10-16 VITALS — BP 118/80 | HR 72 | Temp 98.8°F | Wt 157.0 lb

## 2012-10-16 DIAGNOSIS — J019 Acute sinusitis, unspecified: Secondary | ICD-10-CM

## 2012-10-16 MED ORDER — CLARITHROMYCIN ER 500 MG PO TB24: 1000.0000 mg | ORAL_TABLET | Freq: Every day | ORAL | Status: AC

## 2012-10-16 MED ORDER — AZELASTINE-FLUTICASONE 137-50 MCG/ACT NA SUSP: 1.0000 | Freq: Two times a day (BID) | NASAL | Status: DC

## 2012-10-16 NOTE — Patient Instructions (Signed)

## 2012-10-17 NOTE — Progress Notes (Signed)
  Subjective:     Stephanie [name] is a 62 y.o. female who presents for evaluation of sinus pain. Symptoms include: congestion, headaches, nasal congestion and sinus pressure. Onset of symptoms was 2 weeks ago. Symptoms have been gradually worsening since that time. Past history is significant for no history of pneumonia or bronchitis. Patient is a non-smoker.  The following portions of the patient's history were reviewed and updated as appropriate: allergies, current medications, past family history, past medical history, past social history, past surgical history and problem list.  Review of Systems Pertinent items are noted in HPI.   Objective:    BP 118/80  Pulse 72  Temp(Src) 98.8 F (37.1 C) (Oral)  Wt 157 lb (71.215 kg)  BMI 27.37 kg/m2  SpO2 98% General appearance: alert, cooperative, appears stated age and no distress Ears: + fluid b/l ,  no errythema Nose: green discharge, moderate congestion, turbinates red, swollen, sinus tenderness bilateral Throat: abnormal findings: mild oropharyngeal erythema Neck: mild anterior cervical adenopathy, supple, symmetrical, trachea midline and thyroid not enlarged, symmetric, no tenderness/mass/nodules Lungs: clear to auscultation bilaterally Heart: S1, S2 normal    Assessment:    Acute bacterial sinusitis.    Plan:    Nasal steroids per medication orders. Antihistamines per medication orders. Biaxin per medication orders.

## 2014-07-05 ENCOUNTER — Telehealth: Payer: Self-pay | Admitting: Family Medicine

## 2014-07-05 NOTE — Telephone Encounter (Signed)
Pre visit letter for annual physical mailed

## 2014-07-25 ENCOUNTER — Telehealth: Payer: Self-pay | Admitting: *Deleted

## 2014-07-25 NOTE — Telephone Encounter (Signed)
Unable to reach patient at time of Pre-Visit Call.  Left message for patient to return call when available.    

## 2014-07-26 ENCOUNTER — Encounter: Payer: Self-pay | Admitting: Family Medicine

## 2014-07-26 ENCOUNTER — Ambulatory Visit (INDEPENDENT_AMBULATORY_CARE_PROVIDER_SITE_OTHER): Payer: BLUE CROSS/BLUE SHIELD | Admitting: Family Medicine

## 2014-07-26 VITALS — BP 138/80 | HR 77 | Temp 97.9°F | Resp 18 | Ht 62.5 in | Wt 154.0 lb

## 2014-07-26 DIAGNOSIS — E785 Hyperlipidemia, unspecified: Secondary | ICD-10-CM

## 2014-07-26 DIAGNOSIS — Z0000 Encounter for general adult medical examination without abnormal findings: Secondary | ICD-10-CM | POA: Diagnosis not present

## 2014-07-26 DIAGNOSIS — G4700 Insomnia, unspecified: Secondary | ICD-10-CM

## 2014-07-26 MED ORDER — ZOLPIDEM TARTRATE 5 MG PO TABS: 5.0000 mg | ORAL_TABLET | Freq: Every evening | ORAL | Status: DC | PRN

## 2014-07-26 NOTE — Progress Notes (Deleted)
Pre visit review using our clinic review tool, if applicable. No additional management support is needed unless otherwise documented below in the visit note. 

## 2014-07-26 NOTE — Progress Notes (Signed)
Subjective:     Stephanie [name] is a 64 y.o. female and is here for a comprehensive physical exam. The patient reports no problems.  History   Social History  . Marital Status: Married    Spouse Name: N/A  . Number of Children: 2  . Years of Education: N/A   Occupational History  . self    Social History Main Topics  . Smoking status: Never Smoker   . Smokeless tobacco: Never Used  . Alcohol Use: 3.5 oz/week    7 Standard drinks or equivalent per week  . Drug Use: No  . Sexual Activity: Not on file   Other Topics Concern  . Not on file   Social History Narrative   Married, 1 son and 1 daughter   Self-employed   2 caffienated beverages/day   Health Maintenance  Topic Date Due  . HIV Screening  03/15/1967  . MAMMOGRAM  04/12/2011  . PAP SMEAR  04/11/2012  . INFLUENZA VACCINE  10/10/2014  . COLONOSCOPY  04/19/2019  . TETANUS/TDAP  05/13/2022  . ZOSTAVAX  Completed    The following portions of the patient's history were reviewed and updated as appropriate:  She  has a past medical history of DDD (degenerative disc disease); Hyperlipidemia; and Arthritis. She  does not have any pertinent problems on file. She  has past surgical history that includes Total abdominal hysterectomy w/ bilateral salpingoophorectomy; Cesarean section; and Colonoscopy. Her family history includes Alzheimer's disease in her mother; Arthritis in her brother, father, mother, and sister; Cystic fibrosis in her other; Dementia in her mother; Hypertension in her father and mother; Liver disease in her father, paternal aunt, paternal aunt, and paternal aunt. She  reports that she has never smoked. She has never used smokeless tobacco. She reports that she drinks about 3.5 oz of alcohol per week. She reports that she does not use illicit drugs. She has a current medication list which includes the following prescription(s): diclofenac and zolpidem. No current outpatient prescriptions on file prior to visit.    No current facility-administered medications on file prior to visit.   She is allergic to penicillins..  Review of Systems Review of Systems  Constitutional: Negative for activity change, appetite change and fatigue.  HENT: Negative for hearing loss, congestion, tinnitus and ear discharge.  dentist q8m Eyes: Negative for visual disturbance (see optho q1y -- vision corrected to 20/20 with glasses).  Respiratory: Negative for cough, chest tightness and shortness of breath.   Cardiovascular: Negative for chest pain, palpitations and leg swelling.  Gastrointestinal: Negative for abdominal pain, diarrhea, constipation and abdominal distention.  Genitourinary: Negative for urgency, frequency, decreased urine volume and difficulty urinating.  Musculoskeletal: Negative for back pain, arthralgias and gait problem.  Skin: Negative for color change, pallor and rash.  Neurological: Negative for dizziness, light-headedness, numbness and headaches.  Hematological: Negative for adenopathy. Does not bruise/bleed easily.  Psychiatric/Behavioral: Negative for suicidal ideas, confusion, sleep disturbance, self-injury, dysphoric mood, decreased concentration and agitation.       Objective:    BP 138/80 mmHg  Pulse 77  Temp(Src) 97.9 F (36.6 C) (Oral)  Resp 18  Ht 5' 2.5" (1.588 m)  Wt 154 lb (69.854 kg)  BMI 27.70 kg/m2  SpO2 99% General appearance: alert, cooperative, appears stated age and no distress Head: Normocephalic, without obvious abnormality, atraumatic Eyes: conjunctivae/corneas clear. PERRL, EOM's intact. Fundi benign. Ears: normal TM's and external ear canals both ears Nose: Nares normal. Septum midline. Mucosa normal. No drainage or  sinus tenderness. Throat: lips, mucosa, and tongue normal; teeth and gums normal Neck: no adenopathy, no carotid bruit, no JVD, supple, symmetrical, trachea midline and thyroid not enlarged, symmetric, no tenderness/mass/nodules Back: symmetric, no  curvature. ROM normal. No CVA tenderness. Lungs: clear to auscultation bilaterally Breasts: normal appearance, no masses or tenderness Heart: regular rate and rhythm, S1, S2 normal, no murmur, click, rub or gallop Abdomen: soft, non-tender; bowel sounds normal; no masses,  no organomegaly Pelvic: not indicated; status post hysterectomy, negative ROS Extremities: extremities normal, atraumatic, no cyanosis or edema Pulses: 2+ and symmetric Skin: Skin color, texture, turgor normal. No rashes or lesions Lymph nodes: Cervical, supraclavicular, and axillary nodes normal. Neurologic: Alert and oriented X 3, normal strength and tone. Normal symmetric reflexes. Normal coordination and gait Psych- no depression, no anxiety      Assessment:    Healthy female exam.      Plan:    ghm utd Check labs See After Visit Summary for Counseling Recommendations    1. Hyperlipidemia Pt off meds-- check labs - Basic metabolic panel - CBC with Differential/Platelet - Hepatic function panel - Lipid panel - POCT urinalysis dipstick - TSH  2. Preventative health care  See above - Basic metabolic panel - CBC with Differential/Platelet - Hepatic function panel - Lipid panel - POCT urinalysis dipstick - TSH  3. Insomnia  - zolpidem (AMBIEN) 5 MG tablet; Take 1 tablet (5 mg total) by mouth at bedtime as needed for sleep.  Dispense: 30 tablet; Refill: 1

## 2014-07-26 NOTE — Progress Notes (Signed)
Pre visit review using our clinic review tool, if applicable. No additional management support is needed unless otherwise documented below in the visit note. 

## 2014-07-26 NOTE — Patient Instructions (Signed)
Preventive Care for Adults A healthy lifestyle and preventive care can promote health and wellness. Preventive health guidelines for women include the following key practices.  A routine yearly physical is a good way to check with your health care provider about your health and preventive screening. It is a chance to share any concerns and updates on your health and to receive a thorough exam.  Visit your dentist for a routine exam and preventive care every 6 months. Brush your teeth twice a day and floss once a day. Good oral hygiene prevents tooth decay and gum disease.  The frequency of eye exams is based on your age, health, family medical history, use of contact lenses, and other factors. Follow your health care provider's recommendations for frequency of eye exams.  Eat a healthy diet. Foods like vegetables, fruits, whole grains, low-fat dairy products, and lean protein foods contain the nutrients you need without too many calories. Decrease your intake of foods high in solid fats, added sugars, and salt. Eat the right amount of calories for you.Get information about a proper diet from your health care provider, if necessary.  Regular physical exercise is one of the most important things you can do for your health. Most adults should get at least 150 minutes of moderate-intensity exercise (any activity that increases your heart rate and causes you to sweat) each week. In addition, most adults need muscle-strengthening exercises on 2 or more days a week.  Maintain a healthy weight. The body mass index (BMI) is a screening tool to identify possible weight problems. It provides an estimate of body fat based on height and weight. Your health care provider can find your BMI and can help you achieve or maintain a healthy weight.For adults 20 years and older:  A BMI below 18.5 is considered underweight.  A BMI of 18.5 to 24.9 is normal.  A BMI of 25 to 29.9 is considered overweight.  A BMI of  30 and above is considered obese.  Maintain normal blood lipids and cholesterol levels by exercising and minimizing your intake of saturated fat. Eat a balanced diet with plenty of fruit and vegetables. Blood tests for lipids and cholesterol should begin at age 76 and be repeated every 5 years. If your lipid or cholesterol levels are high, you are over 50, or you are at high risk for heart disease, you may need your cholesterol levels checked more frequently.Ongoing high lipid and cholesterol levels should be treated with medicines if diet and exercise are not working.  If you smoke, find out from your health care provider how to quit. If you do not use tobacco, do not start.  Lung cancer screening is recommended for adults aged 22-80 years who are at high risk for developing lung cancer because of a history of smoking. A yearly low-dose CT scan of the lungs is recommended for people who have at least a 30-pack-year history of smoking and are a current smoker or have quit within the past 15 years. A pack year of smoking is smoking an average of 1 pack of cigarettes a day for 1 year (for example: 1 pack a day for 30 years or 2 packs a day for 15 years). Yearly screening should continue until the smoker has stopped smoking for at least 15 years. Yearly screening should be stopped for people who develop a health problem that would prevent them from having lung cancer treatment.  If you are pregnant, do not drink alcohol. If you are breastfeeding,  be very cautious about drinking alcohol. If you are not pregnant and choose to drink alcohol, do not have more than 1 drink per day. One drink is considered to be 12 ounces (355 mL) of beer, 5 ounces (148 mL) of wine, or 1.5 ounces (44 mL) of liquor.  Avoid use of street drugs. Do not share needles with anyone. Ask for help if you need support or instructions about stopping the use of drugs.  High blood pressure causes heart disease and increases the risk of  stroke. Your blood pressure should be checked at least every 1 to 2 years. Ongoing high blood pressure should be treated with medicines if weight loss and exercise do not work.  If you are 75-52 years old, ask your health care provider if you should take aspirin to prevent strokes.  Diabetes screening involves taking a blood sample to check your fasting blood sugar level. This should be done once every 3 years, after age 15, if you are within normal weight and without risk factors for diabetes. Testing should be considered at a younger age or be carried out more frequently if you are overweight and have at least 1 risk factor for diabetes.  Breast cancer screening is essential preventive care for women. You should practice "breast self-awareness." This means understanding the normal appearance and feel of your breasts and may include breast self-examination. Any changes detected, no matter how small, should be reported to a health care provider. Women in their 58s and 30s should have a clinical breast exam (CBE) by a health care provider as part of a regular health exam every 1 to 3 years. After age 16, women should have a CBE every year. Starting at age 53, women should consider having a mammogram (breast X-ray test) every year. Women who have a family history of breast cancer should talk to their health care provider about genetic screening. Women at a high risk of breast cancer should talk to their health care providers about having an MRI and a mammogram every year.  Breast cancer gene (BRCA)-related cancer risk assessment is recommended for women who have family members with BRCA-related cancers. BRCA-related cancers include breast, ovarian, tubal, and peritoneal cancers. Having family members with these cancers may be associated with an increased risk for harmful changes (mutations) in the breast cancer genes BRCA1 and BRCA2. Results of the assessment will determine the need for genetic counseling and  BRCA1 and BRCA2 testing.  Routine pelvic exams to screen for cancer are no longer recommended for nonpregnant women who are considered low risk for cancer of the pelvic organs (ovaries, uterus, and vagina) and who do not have symptoms. Ask your health care provider if a screening pelvic exam is right for you.  If you have had past treatment for cervical cancer or a condition that could lead to cancer, you need Pap tests and screening for cancer for at least 20 years after your treatment. If Pap tests have been discontinued, your risk factors (such as having a new sexual partner) need to be reassessed to determine if screening should be resumed. Some women have medical problems that increase the chance of getting cervical cancer. In these cases, your health care provider may recommend more frequent screening and Pap tests.  The HPV test is an additional test that may be used for cervical cancer screening. The HPV test looks for the virus that can cause the cell changes on the cervix. The cells collected during the Pap test can be  tested for HPV. The HPV test could be used to screen women aged 30 years and older, and should be used in women of any age who have unclear Pap test results. After the age of 30, women should have HPV testing at the same frequency as a Pap test.  Colorectal cancer can be detected and often prevented. Most routine colorectal cancer screening begins at the age of 50 years and continues through age 75 years. However, your health care provider may recommend screening at an earlier age if you have risk factors for colon cancer. On a yearly basis, your health care provider may provide home test kits to check for hidden blood in the stool. Use of a small camera at the end of a tube, to directly examine the colon (sigmoidoscopy or colonoscopy), can detect the earliest forms of colorectal cancer. Talk to your health care provider about this at age 50, when routine screening begins. Direct  exam of the colon should be repeated every 5-10 years through age 75 years, unless early forms of pre-cancerous polyps or small growths are found.  People who are at an increased risk for hepatitis B should be screened for this virus. You are considered at high risk for hepatitis B if:  You were born in a country where hepatitis B occurs often. Talk with your health care provider about which countries are considered high risk.  Your parents were born in a high-risk country and you have not received a shot to protect against hepatitis B (hepatitis B vaccine).  You have HIV or AIDS.  You use needles to inject street drugs.  You live with, or have sex with, someone who has hepatitis B.  You get hemodialysis treatment.  You take certain medicines for conditions like cancer, organ transplantation, and autoimmune conditions.  Hepatitis C blood testing is recommended for all people born from 1945 through 1965 and any individual with known risks for hepatitis C.  Practice safe sex. Use condoms and avoid high-risk sexual practices to reduce the spread of sexually transmitted infections (STIs). STIs include gonorrhea, chlamydia, syphilis, trichomonas, herpes, HPV, and human immunodeficiency virus (HIV). Herpes, HIV, and HPV are viral illnesses that have no cure. They can result in disability, cancer, and death.  You should be screened for sexually transmitted illnesses (STIs) including gonorrhea and chlamydia if:  You are sexually active and are younger than 24 years.  You are older than 24 years and your health care provider tells you that you are at risk for this type of infection.  Your sexual activity has changed since you were last screened and you are at an increased risk for chlamydia or gonorrhea. Ask your health care provider if you are at risk.  If you are at risk of being infected with HIV, it is recommended that you take a prescription medicine daily to prevent HIV infection. This is  called preexposure prophylaxis (PrEP). You are considered at risk if:  You are a heterosexual woman, are sexually active, and are at increased risk for HIV infection.  You take drugs by injection.  You are sexually active with a partner who has HIV.  Talk with your health care provider about whether you are at high risk of being infected with HIV. If you choose to begin PrEP, you should first be tested for HIV. You should then be tested every 3 months for as long as you are taking PrEP.  Osteoporosis is a disease in which the bones lose minerals and strength   with aging. This can result in serious bone fractures or breaks. The risk of osteoporosis can be identified using a bone density scan. Women ages 65 years and over and women at risk for fractures or osteoporosis should discuss screening with their health care providers. Ask your health care provider whether you should take a calcium supplement or vitamin D to reduce the rate of osteoporosis.  Menopause can be associated with physical symptoms and risks. Hormone replacement therapy is available to decrease symptoms and risks. You should talk to your health care provider about whether hormone replacement therapy is right for you.  Use sunscreen. Apply sunscreen liberally and repeatedly throughout the day. You should seek shade when your shadow is shorter than you. Protect yourself by wearing long sleeves, pants, a wide-brimmed hat, and sunglasses year round, whenever you are outdoors.  Once a month, do a whole body skin exam, using a mirror to look at the skin on your back. Tell your health care provider of new moles, moles that have irregular borders, moles that are larger than a pencil eraser, or moles that have changed in shape or color.  Stay current with required vaccines (immunizations).  Influenza vaccine. All adults should be immunized every year.  Tetanus, diphtheria, and acellular pertussis (Td, Tdap) vaccine. Pregnant women should  receive 1 dose of Tdap vaccine during each pregnancy. The dose should be obtained regardless of the length of time since the last dose. Immunization is preferred during the 27th-36th week of gestation. An adult who has not previously received Tdap or who does not know her vaccine status should receive 1 dose of Tdap. This initial dose should be followed by tetanus and diphtheria toxoids (Td) booster doses every 10 years. Adults with an unknown or incomplete history of completing a 3-dose immunization series with Td-containing vaccines should begin or complete a primary immunization series including a Tdap dose. Adults should receive a Td booster every 10 years.  Varicella vaccine. An adult without evidence of immunity to varicella should receive 2 doses or a second dose if she has previously received 1 dose. Pregnant females who do not have evidence of immunity should receive the first dose after pregnancy. This first dose should be obtained before leaving the health care facility. The second dose should be obtained 4-8 weeks after the first dose.  Human papillomavirus (HPV) vaccine. Females aged 13-26 years who have not received the vaccine previously should obtain the 3-dose series. The vaccine is not recommended for use in pregnant females. However, pregnancy testing is not needed before receiving a dose. If a female is found to be pregnant after receiving a dose, no treatment is needed. In that case, the remaining doses should be delayed until after the pregnancy. Immunization is recommended for any person with an immunocompromised condition through the age of 26 years if she did not get any or all doses earlier. During the 3-dose series, the second dose should be obtained 4-8 weeks after the first dose. The third dose should be obtained 24 weeks after the first dose and 16 weeks after the second dose.  Zoster vaccine. One dose is recommended for adults aged 60 years or older unless certain conditions are  present.  Measles, mumps, and rubella (MMR) vaccine. Adults born before 1957 generally are considered immune to measles and mumps. Adults born in 1957 or later should have 1 or more doses of MMR vaccine unless there is a contraindication to the vaccine or there is laboratory evidence of immunity to   each of the three diseases. A routine second dose of MMR vaccine should be obtained at least 28 days after the first dose for students attending postsecondary schools, health care workers, or international travelers. People who received inactivated measles vaccine or an unknown type of measles vaccine during 1963-1967 should receive 2 doses of MMR vaccine. People who received inactivated mumps vaccine or an unknown type of mumps vaccine before 1979 and are at high risk for mumps infection should consider immunization with 2 doses of MMR vaccine. For females of childbearing age, rubella immunity should be determined. If there is no evidence of immunity, females who are not pregnant should be vaccinated. If there is no evidence of immunity, females who are pregnant should delay immunization until after pregnancy. Unvaccinated health care workers born before 1957 who lack laboratory evidence of measles, mumps, or rubella immunity or laboratory confirmation of disease should consider measles and mumps immunization with 2 doses of MMR vaccine or rubella immunization with 1 dose of MMR vaccine.  Pneumococcal 13-valent conjugate (PCV13) vaccine. When indicated, a person who is uncertain of her immunization history and has no record of immunization should receive the PCV13 vaccine. An adult aged 19 years or older who has certain medical conditions and has not been previously immunized should receive 1 dose of PCV13 vaccine. This PCV13 should be followed with a dose of pneumococcal polysaccharide (PPSV23) vaccine. The PPSV23 vaccine dose should be obtained at least 8 weeks after the dose of PCV13 vaccine. An adult aged 19  years or older who has certain medical conditions and previously received 1 or more doses of PPSV23 vaccine should receive 1 dose of PCV13. The PCV13 vaccine dose should be obtained 1 or more years after the last PPSV23 vaccine dose.  Pneumococcal polysaccharide (PPSV23) vaccine. When PCV13 is also indicated, PCV13 should be obtained first. All adults aged 65 years and older should be immunized. An adult younger than age 65 years who has certain medical conditions should be immunized. Any person who resides in a nursing home or long-term care facility should be immunized. An adult smoker should be immunized. People with an immunocompromised condition and certain other conditions should receive both PCV13 and PPSV23 vaccines. People with human immunodeficiency virus (HIV) infection should be immunized as soon as possible after diagnosis. Immunization during chemotherapy or radiation therapy should be avoided. Routine use of PPSV23 vaccine is not recommended for American Indians, [place] Natives, or people younger than 65 years unless there are medical conditions that require PPSV23 vaccine. When indicated, people who have unknown immunization and have no record of immunization should receive PPSV23 vaccine. One-time revaccination 5 years after the first dose of PPSV23 is recommended for people aged 19-64 years who have chronic kidney failure, nephrotic syndrome, asplenia, or immunocompromised conditions. People who received 1-2 doses of PPSV23 before age 65 years should receive another dose of PPSV23 vaccine at age 65 years or later if at least 5 years have passed since the previous dose. Doses of PPSV23 are not needed for people immunized with PPSV23 at or after age 65 years.  Meningococcal vaccine. Adults with asplenia or persistent complement component deficiencies should receive 2 doses of quadrivalent meningococcal conjugate (MenACWY-D) vaccine. The doses should be obtained at least 2 months apart.  Microbiologists working with certain meningococcal bacteria, military recruits, people at risk during an outbreak, and people who travel to or live in countries with a high rate of meningitis should be immunized. A first-year college student up through age   21 years who is living in a residence hall should receive a dose if she did not receive a dose on or after her 16th birthday. Adults who have certain high-risk conditions should receive one or more doses of vaccine.  Hepatitis A vaccine. Adults who wish to be protected from this disease, have certain high-risk conditions, work with hepatitis A-infected animals, work in hepatitis A research labs, or travel to or work in countries with a high rate of hepatitis A should be immunized. Adults who were previously unvaccinated and who anticipate close contact with an international adoptee during the first 60 days after arrival in the [place] from a country with a high rate of hepatitis A should be immunized.  Hepatitis B vaccine. Adults who wish to be protected from this disease, have certain high-risk conditions, may be exposed to blood or other infectious body fluids, are household contacts or sex partners of hepatitis B positive people, are clients or workers in certain care facilities, or travel to or work in countries with a high rate of hepatitis B should be immunized.  Haemophilus influenzae type b (Hib) vaccine. A previously unvaccinated person with asplenia or sickle cell disease or having a scheduled splenectomy should receive 1 dose of Hib vaccine. Regardless of previous immunization, a recipient of a hematopoietic stem cell transplant should receive a 3-dose series 6-12 months after her successful transplant. Hib vaccine is not recommended for adults with HIV infection. Preventive Services / Frequency Ages 64 to 68 years  Blood pressure check.** / Every 1 to 2 years.  Lipid and cholesterol check.** / Every 5 years beginning at age  22.  Clinical breast exam.** / Every 3 years for women in their 88s and 53s.  BRCA-related cancer risk assessment.** / For women who have family members with a BRCA-related cancer (breast, ovarian, tubal, or peritoneal cancers).  Pap test.** / Every 2 years from ages 90 through 51. Every 3 years starting at age 21 through age 56 or 3 with a history of 3 consecutive normal Pap tests.  HPV screening.** / Every 3 years from ages 24 through ages 1 to 46 with a history of 3 consecutive normal Pap tests.  Hepatitis C blood test.** / For any individual with known risks for hepatitis C.  Skin self-exam. / Monthly.  Influenza vaccine. / Every year.  Tetanus, diphtheria, and acellular pertussis (Tdap, Td) vaccine.** / Consult your health care provider. Pregnant women should receive 1 dose of Tdap vaccine during each pregnancy. 1 dose of Td every 10 years.  Varicella vaccine.** / Consult your health care provider. Pregnant females who do not have evidence of immunity should receive the first dose after pregnancy.  HPV vaccine. / 3 doses over 6 months, if 72 and younger. The vaccine is not recommended for use in pregnant females. However, pregnancy testing is not needed before receiving a dose.  Measles, mumps, rubella (MMR) vaccine.** / You need at least 1 dose of MMR if you were born in 1957 or later. You may also need a 2nd dose. For females of childbearing age, rubella immunity should be determined. If there is no evidence of immunity, females who are not pregnant should be vaccinated. If there is no evidence of immunity, females who are pregnant should delay immunization until after pregnancy.  Pneumococcal 13-valent conjugate (PCV13) vaccine.** / Consult your health care provider.  Pneumococcal polysaccharide (PPSV23) vaccine.** / 1 to 2 doses if you smoke cigarettes or if you have certain conditions.  Meningococcal vaccine.** /  1 dose if you are age 19 to 21 years and a first-year college  student living in a residence hall, or have one of several medical conditions, you need to get vaccinated against meningococcal disease. You may also need additional booster doses.  Hepatitis A vaccine.** / Consult your health care provider.  Hepatitis B vaccine.** / Consult your health care provider.  Haemophilus influenzae type b (Hib) vaccine.** / Consult your health care provider. Ages 40 to 64 years  Blood pressure check.** / Every 1 to 2 years.  Lipid and cholesterol check.** / Every 5 years beginning at age 20 years.  Lung cancer screening. / Every year if you are aged 55-80 years and have a 30-pack-year history of smoking and currently smoke or have quit within the past 15 years. Yearly screening is stopped once you have quit smoking for at least 15 years or develop a health problem that would prevent you from having lung cancer treatment.  Clinical breast exam.** / Every year after age 40 years.  BRCA-related cancer risk assessment.** / For women who have family members with a BRCA-related cancer (breast, ovarian, tubal, or peritoneal cancers).  Mammogram.** / Every year beginning at age 40 years and continuing for as long as you are in good health. Consult with your health care provider.  Pap test.** / Every 3 years starting at age 30 years through age 65 or 70 years with a history of 3 consecutive normal Pap tests.  HPV screening.** / Every 3 years from ages 30 years through ages 65 to 70 years with a history of 3 consecutive normal Pap tests.  Fecal occult blood test (FOBT) of stool. / Every year beginning at age 50 years and continuing until age 75 years. You may not need to do this test if you get a colonoscopy every 10 years.  Flexible sigmoidoscopy or colonoscopy.** / Every 5 years for a flexible sigmoidoscopy or every 10 years for a colonoscopy beginning at age 50 years and continuing until age 75 years.  Hepatitis C blood test.** / For all people born from 1945 through  1965 and any individual with known risks for hepatitis C.  Skin self-exam. / Monthly.  Influenza vaccine. / Every year.  Tetanus, diphtheria, and acellular pertussis (Tdap/Td) vaccine.** / Consult your health care provider. Pregnant women should receive 1 dose of Tdap vaccine during each pregnancy. 1 dose of Td every 10 years.  Varicella vaccine.** / Consult your health care provider. Pregnant females who do not have evidence of immunity should receive the first dose after pregnancy.  Zoster vaccine.** / 1 dose for adults aged 60 years or older.  Measles, mumps, rubella (MMR) vaccine.** / You need at least 1 dose of MMR if you were born in 1957 or later. You may also need a 2nd dose. For females of childbearing age, rubella immunity should be determined. If there is no evidence of immunity, females who are not pregnant should be vaccinated. If there is no evidence of immunity, females who are pregnant should delay immunization until after pregnancy.  Pneumococcal 13-valent conjugate (PCV13) vaccine.** / Consult your health care provider.  Pneumococcal polysaccharide (PPSV23) vaccine.** / 1 to 2 doses if you smoke cigarettes or if you have certain conditions.  Meningococcal vaccine.** / Consult your health care provider.  Hepatitis A vaccine.** / Consult your health care provider.  Hepatitis B vaccine.** / Consult your health care provider.  Haemophilus influenzae type b (Hib) vaccine.** / Consult your health care provider. Ages 65   years and over  Blood pressure check.** / Every 1 to 2 years.  Lipid and cholesterol check.** / Every 5 years beginning at age 22 years.  Lung cancer screening. / Every year if you are aged 73-80 years and have a 30-pack-year history of smoking and currently smoke or have quit within the past 15 years. Yearly screening is stopped once you have quit smoking for at least 15 years or develop a health problem that would prevent you from having lung cancer  treatment.  Clinical breast exam.** / Every year after age 4 years.  BRCA-related cancer risk assessment.** / For women who have family members with a BRCA-related cancer (breast, ovarian, tubal, or peritoneal cancers).  Mammogram.** / Every year beginning at age 40 years and continuing for as long as you are in good health. Consult with your health care provider.  Pap test.** / Every 3 years starting at age 9 years through age 34 or 91 years with 3 consecutive normal Pap tests. Testing can be stopped between 65 and 70 years with 3 consecutive normal Pap tests and no abnormal Pap or HPV tests in the past 10 years.  HPV screening.** / Every 3 years from ages 57 years through ages 64 or 45 years with a history of 3 consecutive normal Pap tests. Testing can be stopped between 65 and 70 years with 3 consecutive normal Pap tests and no abnormal Pap or HPV tests in the past 10 years.  Fecal occult blood test (FOBT) of stool. / Every year beginning at age 15 years and continuing until age 17 years. You may not need to do this test if you get a colonoscopy every 10 years.  Flexible sigmoidoscopy or colonoscopy.** / Every 5 years for a flexible sigmoidoscopy or every 10 years for a colonoscopy beginning at age 86 years and continuing until age 71 years.  Hepatitis C blood test.** / For all people born from [birth year] through 1963 and any individual with known risks for hepatitis C.  Osteoporosis screening.** / A one-time screening for women ages 83 years and over and women at risk for fractures or osteoporosis.  Skin self-exam. / Monthly.  Influenza vaccine. / Every year.  Tetanus, diphtheria, and acellular pertussis (Tdap/Td) vaccine.** / 1 dose of Td every 10 years.  Varicella vaccine.** / Consult your health care provider.  Zoster vaccine.** / 1 dose for adults aged 61 years or older.  Pneumococcal 13-valent conjugate (PCV13) vaccine.** / Consult your health care provider.  Pneumococcal  polysaccharide (PPSV23) vaccine.** / 1 dose for all adults aged 28 years and older.  Meningococcal vaccine.** / Consult your health care provider.  Hepatitis A vaccine.** / Consult your health care provider.  Hepatitis B vaccine.** / Consult your health care provider.  Haemophilus influenzae type b (Hib) vaccine.** / Consult your health care provider. ** Family history and personal history of risk and conditions may change your health care provider's recommendations. Document Released: 04/23/2001 Document Revised: 07/12/2013 Document Reviewed: 07/23/2010 [facility] Patient Information 2015 [place], [place]. This information is not intended to replace advice given to you by your health care provider. Make sure you discuss any questions you have with your health care provider.

## 2014-07-27 LAB — LIPID PANEL
Cholesterol: 285 mg/dL — ABNORMAL HIGH (ref 0–200)
HDL: 79.8 mg/dL (ref >39.00)
LDL Cholesterol: 191 mg/dL — ABNORMAL HIGH (ref 0–99)
NonHDL: 205.2
Total CHOL/HDL Ratio: 4
Triglycerides: 69 mg/dL (ref 0.0–149.0)
VLDL: 13.8 mg/dL (ref 0.0–40.0)

## 2014-07-27 LAB — CBC WITH DIFFERENTIAL/PLATELET
Basophils Absolute: 0 10*3/uL (ref 0.0–0.1)
Basophils Relative: 0.6 % (ref 0.0–3.0)
Eosinophils Absolute: 0.1 10*3/uL (ref 0.0–0.7)
Eosinophils Relative: 1.2 % (ref 0.0–5.0)
HCT: 40.6 % (ref 36.0–46.0)
Hemoglobin: 13.7 g/dL (ref 12.0–15.0)
Lymphocytes Relative: 34.3 % (ref 12.0–46.0)
Lymphs Abs: 2.1 10*3/uL (ref 0.7–4.0)
MCHC: 33.8 g/dL (ref 30.0–36.0)
MCV: 90.1 fl (ref 78.0–100.0)
Monocytes Absolute: 0.3 10*3/uL (ref 0.1–1.0)
Monocytes Relative: 5.4 % (ref 3.0–12.0)
Neutro Abs: 3.5 10*3/uL (ref 1.4–7.7)
Neutrophils Relative %: 58.5 % (ref 43.0–77.0)
Platelets: 233 10*3/uL (ref 150.0–400.0)
RBC: 4.5 Mil/uL (ref 3.87–5.11)
RDW: 13.6 % (ref 11.5–15.5)
WBC: 6 10*3/uL (ref 4.0–10.5)

## 2014-07-27 LAB — HEPATIC FUNCTION PANEL
ALT: 13 U/L (ref 0–35)
AST: 19 U/L (ref 0–37)
Albumin: 4.3 g/dL (ref 3.5–5.2)
Alkaline Phosphatase: 65 U/L (ref 39–117)
Bilirubin, Direct: 0.1 mg/dL (ref 0.0–0.3)
Total Bilirubin: 0.6 mg/dL (ref 0.2–1.2)
Total Protein: 7.2 g/dL (ref 6.0–8.3)

## 2014-07-27 LAB — BASIC METABOLIC PANEL
BUN: 15 mg/dL (ref 6–23)
CO2: 27 mEq/L (ref 19–32)
Calcium: 9.8 mg/dL (ref 8.4–10.5)
Chloride: 102 mEq/L (ref 96–112)
Creatinine, Ser: 0.85 mg/dL (ref 0.40–1.20)
GFR: 71.94 mL/min (ref >60.00)
Glucose, Bld: 80 mg/dL (ref 70–99)
Potassium: 3.8 mEq/L (ref 3.5–5.1)
Sodium: 137 mEq/L (ref 135–145)

## 2014-07-27 LAB — TSH: TSH: 1.89 u[IU]/mL (ref 0.35–4.50)

## 2014-09-15 ENCOUNTER — Encounter: Payer: Self-pay | Admitting: Internal Medicine

## 2015-04-13 ENCOUNTER — Encounter: Payer: Self-pay | Admitting: Family Medicine

## 2015-04-13 ENCOUNTER — Ambulatory Visit (INDEPENDENT_AMBULATORY_CARE_PROVIDER_SITE_OTHER): Payer: BLUE CROSS/BLUE SHIELD | Admitting: Family Medicine

## 2015-04-13 VITALS — BP 114/66 | HR 71 | Temp 97.7°F | Wt 161.4 lb

## 2015-04-13 DIAGNOSIS — R300 Dysuria: Secondary | ICD-10-CM | POA: Diagnosis not present

## 2015-04-13 DIAGNOSIS — K219 Gastro-esophageal reflux disease without esophagitis: Secondary | ICD-10-CM

## 2015-04-13 LAB — CBC WITH DIFFERENTIAL/PLATELET
Basophils Absolute: 0 10*3/uL (ref 0.0–0.1)
Basophils Relative: 0.6 % (ref 0.0–3.0)
Eosinophils Absolute: 0.1 10*3/uL (ref 0.0–0.7)
Eosinophils Relative: 1.1 % (ref 0.0–5.0)
HCT: 43 % (ref 36.0–46.0)
Hemoglobin: 14.1 g/dL (ref 12.0–15.0)
Lymphocytes Relative: 31.7 % (ref 12.0–46.0)
Lymphs Abs: 2.3 10*3/uL (ref 0.7–4.0)
MCHC: 32.7 g/dL (ref 30.0–36.0)
MCV: 92.1 fl (ref 78.0–100.0)
Monocytes Absolute: 0.7 10*3/uL (ref 0.1–1.0)
Monocytes Relative: 9.2 % (ref 3.0–12.0)
Neutro Abs: 4.2 10*3/uL (ref 1.4–7.7)
Neutrophils Relative %: 57.4 % (ref 43.0–77.0)
Platelets: 230 10*3/uL (ref 150.0–400.0)
RBC: 4.68 Mil/uL (ref 3.87–5.11)
RDW: 13.2 % (ref 11.5–15.5)
WBC: 7.3 10*3/uL (ref 4.0–10.5)

## 2015-04-13 LAB — COMPREHENSIVE METABOLIC PANEL
ALT: 14 U/L (ref 0–35)
AST: 18 U/L (ref 0–37)
Albumin: 4.2 g/dL (ref 3.5–5.2)
Alkaline Phosphatase: 71 U/L (ref 39–117)
BUN: 15 mg/dL (ref 6–23)
CO2: 28 mEq/L (ref 19–32)
Calcium: 9.7 mg/dL (ref 8.4–10.5)
Chloride: 102 mEq/L (ref 96–112)
Creatinine, Ser: 0.9 mg/dL (ref 0.40–1.20)
GFR: 67.2 mL/min (ref >60.00)
Glucose, Bld: 117 mg/dL — ABNORMAL HIGH (ref 70–99)
Potassium: 4.7 mEq/L (ref 3.5–5.1)
Sodium: 140 mEq/L (ref 135–145)
Total Bilirubin: 0.4 mg/dL (ref 0.2–1.2)
Total Protein: 7.4 g/dL (ref 6.0–8.3)

## 2015-04-13 LAB — POC URINALSYSI DIPSTICK (AUTOMATED)
Bilirubin, UA: NEGATIVE
Blood, UA: NEGATIVE
Glucose, UA: NEGATIVE
Ketones, UA: NEGATIVE
Leukocytes, UA: NEGATIVE
Nitrite, UA: NEGATIVE
Protein, UA: NEGATIVE
Spec Grav, UA: 1.01
Urobilinogen, UA: 1
pH, UA: 6

## 2015-04-13 LAB — H. PYLORI ANTIBODY, IGG: H Pylori IgG: NEGATIVE

## 2015-04-13 LAB — TSH: TSH: 3.15 u[IU]/mL (ref 0.35–4.50)

## 2015-04-13 MED ORDER — PANTOPRAZOLE SODIUM 40 MG PO TBEC: 40.0000 mg | DELAYED_RELEASE_TABLET | Freq: Every day | ORAL | Status: DC

## 2015-04-13 MED ORDER — CIPROFLOXACIN HCL 250 MG PO TABS: 250.0000 mg | ORAL_TABLET | Freq: Two times a day (BID) | ORAL | Status: DC

## 2015-04-13 NOTE — Progress Notes (Signed)
Pre visit review using our clinic review tool, if applicable. No additional management support is needed unless otherwise documented below in the visit note. 

## 2015-04-13 NOTE — Patient Instructions (Signed)
Food Choices for Gastroesophageal Reflux Disease, Adult When you have gastroesophageal reflux disease (GERD), the foods you eat and your eating habits are very important. Choosing the right foods can help ease the discomfort of GERD. WHAT GENERAL GUIDELINES DO I NEED TO FOLLOW?  Choose fruits, vegetables, whole grains, low-fat dairy products, and low-fat meat, fish, and poultry.  Limit fats such as oils, salad dressings, butter, nuts, and avocado.  Keep a food diary to identify foods that cause symptoms.  Avoid foods that cause reflux. These may be different for different people.  Eat frequent small meals instead of three large meals each day.  Eat your meals slowly, in a relaxed setting.  Limit fried foods.  Cook foods using methods other than frying.  Avoid drinking alcohol.  Avoid drinking large amounts of liquids with your meals.  Avoid bending over or lying down until 2-3 hours after eating. WHAT FOODS ARE NOT RECOMMENDED? The following are some foods and drinks that may worsen your symptoms: Vegetables Tomatoes. Tomato juice. Tomato and spaghetti sauce. Chili peppers. Onion and garlic. Horseradish. Fruits Oranges, grapefruit, and lemon (fruit and juice). Meats High-fat meats, fish, and poultry. This includes hot dogs, ribs, ham, sausage, salami, and bacon. Dairy Whole milk and chocolate milk. Sour cream. Cream. Butter. Ice cream. Cream cheese.  Beverages Coffee and tea, with or without caffeine. Carbonated beverages or energy drinks. Condiments Hot sauce. Barbecue sauce.  Sweets/Desserts Chocolate and cocoa. Donuts. Peppermint and spearmint. Fats and Oils High-fat foods, including French fries and potato chips. Other Vinegar. Strong spices, such as black pepper, white pepper, red pepper, cayenne, curry powder, cloves, ginger, and chili powder. The items listed above may not be a complete list of foods and beverages to avoid. Contact your dietitian for more  information.   This information is not intended to replace advice given to you by your health care provider. Make sure you discuss any questions you have with your health care provider.   Document Released: 02/25/2005 Document Revised: 03/18/2014 Document Reviewed: 12/30/2012 Elsevier Interactive Patient Education 2016 [employer]  

## 2015-04-15 LAB — URINE CULTURE: Colony Count: >=100000

## 2015-04-15 NOTE — Progress Notes (Signed)
Patient ID: Stephanie [name], female    DOB: May 17, 1950  Age: 65 y.o. MRN: [id number]    Subjective:  Subjective HPI Stephanie [name] presents with c/o dysuria and gerd.  Symptoms of dysuria x few days.  No fever, no back pain.   Review of Systems  Constitutional: Negative for diaphoresis, appetite change, fatigue and unexpected weight change.  Eyes: Negative for pain, redness and visual disturbance.  Respiratory: Negative for cough, chest tightness, shortness of breath and wheezing.   Cardiovascular: Negative for chest pain, palpitations and leg swelling.  Endocrine: Negative for cold intolerance, heat intolerance, polydipsia, polyphagia and polyuria.  Genitourinary: Negative for dysuria, frequency and difficulty urinating.  Neurological: Negative for dizziness, light-headedness, numbness and headaches.    History Past Medical History  Diagnosis Date  . DDD (degenerative disc disease)   . Hyperlipidemia   . Arthritis     She has past surgical history that includes Total abdominal hysterectomy w/ bilateral salpingoophorectomy; Cesarean section; and Colonoscopy.   Her family history includes Alzheimer's disease in her mother; Arthritis in her brother, father, mother, and sister; Cystic fibrosis in her other; Dementia in her mother; Hypertension in her father and mother; Liver disease in her father, paternal aunt, paternal aunt, and paternal aunt.She reports that she has never smoked. She has never used smokeless tobacco. She reports that she drinks about 3.5 oz of alcohol per week. She reports that she does not use illicit drugs.  Current Outpatient Prescriptions on File Prior to Visit  Medication Sig Dispense Refill  . diclofenac (VOLTAREN) 75 MG EC tablet Take 75 mg by mouth 2 (two) times daily.    Marland Kitchen zolpidem (AMBIEN) 5 MG tablet Take 1 tablet (5 mg total) by mouth at bedtime as needed for sleep. (Patient not taking: Reported on 04/13/2015) 30 tablet 1   No current facility-administered  medications on file prior to visit.     Objective:  Objective Physical Exam  Constitutional: She is oriented to person, place, and time. She appears well-developed and well-nourished.  HENT:  Head: Normocephalic and atraumatic.  Eyes: Conjunctivae and EOM are normal.  Neck: Normal range of motion. Neck supple. No JVD present. Carotid bruit is not present. No thyromegaly present.  Cardiovascular: Normal rate, regular rhythm and normal heart sounds.   No murmur heard. Pulmonary/Chest: Effort normal and breath sounds normal. No respiratory distress. She has no wheezes. She has no rales. She exhibits no tenderness.  Musculoskeletal: She exhibits no edema.  Neurological: She is alert and oriented to person, place, and time.  Psychiatric: She has a normal mood and affect.  Nursing note and vitals reviewed.  BP 114/66 mmHg  Pulse 71  Temp(Src) 97.7 F (36.5 C) (Oral)  Wt 161 lb 6.4 oz (73.211 kg)  SpO2 95% Wt Readings from Last 3 Encounters:  04/13/15 161 lb 6.4 oz (73.211 kg)  07/26/14 154 lb (69.854 kg)  10/16/12 157 lb (71.215 kg)     Lab Results  Component Value Date   WBC 7.3 04/13/2015   HGB 14.1 04/13/2015   HCT 43.0 04/13/2015   PLT 230.0 04/13/2015   GLUCOSE 117* 04/13/2015   CHOL 285* 07/26/2014   TRIG 69.0 07/26/2014   HDL 79.80 07/26/2014   LDLDIRECT 103.2 10/15/2011   LDLCALC 191* 07/26/2014   ALT 14 04/13/2015   AST 18 04/13/2015   NA 140 04/13/2015   K 4.7 04/13/2015   CL 102 04/13/2015   CREATININE 0.90 04/13/2015   BUN 15 04/13/2015   CO2  28 04/13/2015   TSH 3.15 04/13/2015    No results found.   Assessment & Plan:  Plan I am having Stephanie [name] start on pantoprazole and ciprofloxacin. I am also having her maintain her diclofenac and zolpidem.  Meds ordered this encounter  Medications  . pantoprazole (PROTONIX) 40 MG tablet    Sig: Take 1 tablet (40 mg total) by mouth daily.    Dispense:  30 tablet    Refill:  3  . ciprofloxacin (CIPRO) 250 MG  tablet    Sig: Take 1 tablet (250 mg total) by mouth 2 (two) times daily.    Dispense:  6 tablet    Refill:  0    Problem List Items Addressed This Visit    None    Visit Diagnoses    Dysuria    -  Primary    Relevant Medications    ciprofloxacin (CIPRO) 250 MG tablet    Other Relevant Orders    POCT Urinalysis Dipstick (Automated) (Completed)    Urine culture (Completed)    CBC with Differential/Platelet (Completed)    TSH (Completed)    Comp Met (CMET) (Completed)    Gastroesophageal reflux disease, esophagitis presence not specified        Relevant Medications    pantoprazole (PROTONIX) 40 MG tablet    Other Relevant Orders    CBC with Differential/Platelet (Completed)    TSH (Completed)    Comp Met (CMET) (Completed)    H. pylori antibody, IgG (Completed)       Follow-up: Return if symptoms worsen or fail to improve.  Garnet Koyanagi, DO

## 2015-04-26 ENCOUNTER — Encounter: Payer: BLUE CROSS/BLUE SHIELD | Admitting: Sports Medicine

## 2016-03-11 HISTORY — PX: COLONOSCOPY: SHX174

## 2016-03-11 HISTORY — PX: POLYPECTOMY: SHX149

## 2016-07-02 ENCOUNTER — Encounter: Payer: Self-pay | Admitting: Internal Medicine

## 2016-07-30 ENCOUNTER — Ambulatory Visit (AMBULATORY_SURGERY_CENTER): Payer: Self-pay

## 2016-07-30 VITALS — Ht 63.0 in | Wt 152.4 lb

## 2016-07-30 DIAGNOSIS — Z1211 Encounter for screening for malignant neoplasm of colon: Secondary | ICD-10-CM

## 2016-07-30 NOTE — Progress Notes (Signed)
Per pt, no allergies to soy or egg products.Pt not taking any weight loss meds or using  O2 at home.    Pt refused Emmi video.  Per pt, "hard to wake up"  past c-section 1 time!

## 2016-08-02 ENCOUNTER — Encounter: Payer: Self-pay | Admitting: Internal Medicine

## 2016-08-13 ENCOUNTER — Encounter: Payer: Self-pay | Admitting: Internal Medicine

## 2016-08-13 ENCOUNTER — Ambulatory Visit (AMBULATORY_SURGERY_CENTER): Payer: BLUE CROSS/BLUE SHIELD | Admitting: Internal Medicine

## 2016-08-13 VITALS — BP 137/71 | HR 63 | Temp 99.1°F | Resp 17 | Ht 63.0 in | Wt 152.0 lb

## 2016-08-13 DIAGNOSIS — D122 Benign neoplasm of ascending colon: Secondary | ICD-10-CM | POA: Diagnosis not present

## 2016-08-13 DIAGNOSIS — Z1212 Encounter for screening for malignant neoplasm of rectum: Secondary | ICD-10-CM | POA: Diagnosis not present

## 2016-08-13 DIAGNOSIS — Z1211 Encounter for screening for malignant neoplasm of colon: Secondary | ICD-10-CM

## 2016-08-13 DIAGNOSIS — D124 Benign neoplasm of descending colon: Secondary | ICD-10-CM

## 2016-08-13 DIAGNOSIS — D125 Benign neoplasm of sigmoid colon: Secondary | ICD-10-CM | POA: Diagnosis not present

## 2016-08-13 DIAGNOSIS — K635 Polyp of colon: Secondary | ICD-10-CM

## 2016-08-13 MED ORDER — SODIUM CHLORIDE 0.9 % IV SOLN: 500.0000 mL | INTRAVENOUS | Status: DC

## 2016-08-13 NOTE — Progress Notes (Signed)
Called to room to assist during endoscopic procedure.  Patient ID and intended procedure confirmed with present staff. Received instructions for my participation in the procedure from the performing physician.  

## 2016-08-13 NOTE — Progress Notes (Signed)
Vss. ALERT AND ORIENTEDX3. REPORT TO [facility] RN. PLEASED WITH MAC

## 2016-08-13 NOTE — Op Note (Signed)
[place] Patient Name: Stephanie [name] Procedure Date: 08/13/2016 3:41 PM MRN: [id number] Endoscopist: Gatha Mayer , MD Age: 66 Referring MD:  Date of Birth: Nov 19, 1950 Gender: Female Account #: [id number] Procedure:                Colonoscopy Indications:              Screening for colorectal malignant neoplasm, Last                            colonoscopy: 2006 Medicines:                Propofol per Anesthesia, Monitored Anesthesia Care Procedure:                Pre-Anesthesia Assessment:                           - Prior to the procedure, a History and Physical                            was performed, and patient medications and                            allergies were reviewed. The patient's tolerance of                            previous anesthesia was also reviewed. The risks                            and benefits of the procedure and the sedation                            options and risks were discussed with the patient.                            All questions were answered, and informed consent                            was obtained. Prior Anticoagulants: The patient has                            taken no previous anticoagulant or antiplatelet                            agents. ASA Grade Assessment: II - A patient with                            mild systemic disease. After reviewing the risks                            and benefits, the patient was deemed in                            satisfactory condition to undergo the procedure.  After obtaining informed consent, the colonoscope                            was passed under direct vision. Throughout the                            procedure, the patient's blood pressure, pulse, and                            oxygen saturations were monitored continuously. The                            Colonoscope was introduced through the anus and                            advanced to the the  cecum, identified by                            appendiceal orifice and ileocecal valve. The                            colonoscopy was performed without difficulty. The                            patient tolerated the procedure well. The quality                            of the bowel preparation was excellent. The bowel                            preparation used was Miralax. The ileocecal valve,                            appendiceal orifice, and rectum were photographed. Scope In: 3:51:33 PM Scope Out: 4:15:18 PM Scope Withdrawal Time: 0 hours 19 minutes 45 seconds  Total Procedure Duration: 0 hours 23 minutes 45 seconds  Findings:                 The perianal and digital rectal examinations were                            normal.                           Six sessile polyps were found in the sigmoid colon,                            descending colon and ascending colon. The polyps                            were 3 to 10 mm in size. These polyps were removed                            with a cold snare. Resection and retrieval were  complete. Verification of patient identification                            for the specimen was done. Estimated blood loss was                            minimal.                           Many diverticula were found in the sigmoid colon.                            Estimated blood loss: none.                           The exam was otherwise without abnormality on                            direct and retroflexion views. Complications:            No immediate complications. Estimated Blood Loss:     Estimated blood loss was minimal. Impression:               - Six 3 to 10 mm polyps in the sigmoid colon, in                            the descending colon and in the ascending colon,                            removed with a cold snare. Resected and retrieved.                           - Diverticulosis in the sigmoid colon.                            - The examination was otherwise normal on direct                            and retroflexion views. Recommendation:           - Patient has a contact number available for                            emergencies. The signs and symptoms of potential                            delayed complications were discussed with the                            patient. Return to normal activities tomorrow.                            Written discharge instructions were provided to the                            patient.                           -  Resume previous diet.                           - Continue present medications.                           - Repeat colonoscopy is recommended. The                            colonoscopy date will be determined after pathology                            results from today's exam become available for                            review. Gatha Mayer, MD 08/13/2016 4:22:56 PM This report has been signed electronically.

## 2016-08-13 NOTE — Progress Notes (Signed)
Pt's states no medical or surgical changes since previsit or office visit. 

## 2016-08-13 NOTE — Patient Instructions (Addendum)
I found and removed 6 polyps.  All look benign. I will let you know pathology results and when to have another routine colonoscopy by mail and/or My Chart.  I appreciate the opportunity to care for you. Gatha Mayer, MD, FACG YOU HAD AN ENDOSCOPIC PROCEDURE TODAY AT [facility]:   Refer to the procedure report that was given to you for any specific questions about what was found during the examination.  If the procedure report does not answer your questions, please call your gastroenterologist to clarify.  If you requested that your care partner not be given the details of your procedure findings, then the procedure report has been included in a sealed envelope for you to review at your convenience later.  YOU SHOULD EXPECT: Some feelings of bloating in the abdomen. Passage of more gas than usual.  Walking can help get rid of the air that was put into your GI tract during the procedure and reduce the bloating. If you had a lower endoscopy (such as a colonoscopy or flexible sigmoidoscopy) you may notice spotting of blood in your stool or on the toilet paper. If you underwent a bowel prep for your procedure, you may not have a normal bowel movement for a few days.  Please Note:  You might notice some irritation and congestion in your nose or some drainage.  This is from the oxygen used during your procedure.  There is no need for concern and it should clear up in a day or so.  SYMPTOMS TO REPORT IMMEDIATELY:   Following lower endoscopy (colonoscopy or flexible sigmoidoscopy):  Excessive amounts of blood in the stool  Significant tenderness or worsening of abdominal pains  Swelling of the abdomen that is new, acute  Fever of 100F or higher  For urgent or emergent issues, a gastroenterologist can be reached at any hour by calling [phone].   DIET:  We do recommend a small meal at first, but then you may proceed to your regular diet.  Drink plenty of fluids but  you should avoid alcoholic beverages for 24 hours.  MEDICATIONS:  Continue present medications.  Please see handouts given to you by your recovery nurse.  ACTIVITY:  You should plan to take it easy for the rest of today and you should NOT DRIVE or use heavy machinery until tomorrow (because of the sedation medicines used during the test).    FOLLOW UP: Our staff will call the number listed on your records the next business day following your procedure to check on you and address any questions or concerns that you may have regarding the information given to you following your procedure. If we do not reach you, we will leave a message.  However, if you are feeling well and you are not experiencing any problems, there is no need to return our call.  We will assume that you have returned to your regular daily activities without incident.  If any biopsies were taken you will be contacted by phone or by letter within the next 1-3 weeks.  Please call us at [phone] if you have not heard about the biopsies in 3 weeks.   Thank you for allowing [place] to provide for your healthcare needs today.   SIGNATURES/CONFIDENTIALITY: You and/or your care partner have signed paperwork which will be entered into your electronic medical record.  These signatures attest to the fact that that the information above on your After Visit Summary has been reviewed and is  understood.  Full responsibility of the confidentiality of this discharge information lies with you and/or your care-partner. 

## 2016-08-14 ENCOUNTER — Telehealth: Payer: Self-pay | Admitting: *Deleted

## 2016-08-14 NOTE — Telephone Encounter (Signed)
  Follow up Call-  Call back number 08/13/2016  Post procedure Call Back phone  # [phone]  Permission to leave phone message Yes  Some recent data might be hidden    [facility]

## 2016-08-14 NOTE — Telephone Encounter (Signed)
  Follow up Call-  Call back number 08/13/2016  Post procedure Call Back phone  # [phone]  Permission to leave phone message Yes  Some recent data might be hidden    Second call back.  No answer.  Left message to call office if any questions or concerns.

## 2016-08-20 ENCOUNTER — Encounter: Payer: Self-pay | Admitting: Internal Medicine

## 2016-08-20 DIAGNOSIS — Z860100 Personal history of colon polyps, unspecified: Secondary | ICD-10-CM

## 2016-08-20 HISTORY — DX: Personal history of colonic polyps: Z86.010

## 2016-08-20 HISTORY — DX: Personal history of colon polyps, unspecified: Z86.0100

## 2016-08-20 NOTE — Progress Notes (Signed)
2 adenomas and 4 ssp's Recall colonoscopy 2020 My Chart letter

## 2017-04-23 ENCOUNTER — Encounter: Payer: Self-pay | Admitting: Medical

## 2017-04-23 ENCOUNTER — Ambulatory Visit (INDEPENDENT_AMBULATORY_CARE_PROVIDER_SITE_OTHER): Payer: PPO | Admitting: Medical

## 2017-04-23 VITALS — BP 126/56 | HR 92 | Temp 98.3°F | Resp 16 | Wt 153.8 lb

## 2017-04-23 DIAGNOSIS — M791 Myalgia, unspecified site: Secondary | ICD-10-CM

## 2017-04-23 DIAGNOSIS — J029 Acute pharyngitis, unspecified: Secondary | ICD-10-CM | POA: Diagnosis not present

## 2017-04-23 DIAGNOSIS — R0981 Nasal congestion: Secondary | ICD-10-CM | POA: Diagnosis not present

## 2017-04-23 DIAGNOSIS — R05 Cough: Secondary | ICD-10-CM

## 2017-04-23 DIAGNOSIS — R059 Cough, unspecified: Secondary | ICD-10-CM

## 2017-04-23 LAB — POCT INFLUENZA A/B
Influenza A, POC: NEGATIVE
Influenza B, POC: NEGATIVE

## 2017-04-23 LAB — POCT RAPID STREP A (OFFICE): Rapid Strep A Screen: NEGATIVE

## 2017-04-23 MED ORDER — HYDROCODONE-HOMATROPINE 5-1.5 MG/5ML PO SYRP: 5.0000 mL | ORAL_SOLUTION | Freq: Four times a day (QID) | ORAL | 0 refills | Status: DC | PRN

## 2017-04-23 MED ORDER — FLUTICASONE PROPIONATE 50 MCG/ACT NA SUSP: 2.0000 | Freq: Every day | NASAL | 1 refills | Status: DC

## 2017-04-23 MED ORDER — AZITHROMYCIN 250 MG PO TABS: ORAL_TABLET | ORAL | 0 refills | Status: DC

## 2017-04-23 NOTE — Patient Instructions (Addendum)
Both your strep test and flu test were negative.  By exam and clinically I am not convinced that you have either infection.(However of the 2 potential infections strep I think would be more likely.)  I do have concern that you have early bronchitis and that your illness might be prolonged again up to 3 weeks as you described previously over the past year.  I do think is a good idea to go ahead and start a azithromycin antibiotic, Flonase for nasal congestion, and Hycodan for cough.  Rest and keep yourself hydrated.  Tylenol for fever.  Follow-up in 7-10 days or as needed.

## 2017-04-23 NOTE — Progress Notes (Signed)
Subjective:    Patient ID: Stephanie [name], female    DOB: [birth year], 67 y.o.   MRN: [id number]  HPI  Pt in with 3rd day of feeling sick. She states throat is sore with some ear pressure. Temp 100.5 last night. Pt has 2 grandchildren that are often sick. Pt does have body aches with some fatigue. She states eventually she states often with illnesses will get hacky cough and bronchitis.  Pt states recent body aches started night before last. Pt did not get flu vaccine this year.  In regards to her muscle aches she states that it is not like when she had flu previously.  Pt states 2 times since October had similar illness and eventually developed severe cough that would take 2-2.5 weeks to get over cough.    Review of Systems  HENT: Positive for congestion.   Respiratory: Positive for cough. Negative for chest tightness, shortness of breath and wheezing.        Occasional producitve  Cardiovascular: Negative for chest pain and palpitations.  Gastrointestinal: Negative for abdominal pain and anal bleeding.  Musculoskeletal: Positive for myalgias. Negative for back pain.  Skin: Negative for rash.  Neurological: Negative for dizziness, seizures, syncope, speech difficulty, weakness, light-headedness and headaches.  Hematological: Negative for adenopathy. Does not bruise/bleed easily.  Psychiatric/Behavioral: Negative for behavioral problems, confusion and suicidal ideas. The patient is not nervous/anxious.     Past Medical History:  Diagnosis Date  . Arthritis   . Cataract    beginning signs of cataract  . Complication of anesthesia    Per pt, hard to wake up past c-section/ 1 time.  . DDD (degenerative disc disease)    in feet and hands  . GERD (gastroesophageal reflux disease)   . Hx of colonic polyps 08/20/2016  . Hyperlipidemia   . Lactose intolerance      Social History   Socioeconomic History  . Marital status: Married    Spouse name: Not on file  . Number of children:  2  . Years of education: Not on file  . Highest education level: Not on file  Social Needs  . Financial resource strain: Not on file  . Food insecurity - worry: Not on file  . Food insecurity - inability: Not on file  . Transportation needs - medical: Not on file  . Transportation needs - non-medical: Not on file  Occupational History  . Occupation: self  Tobacco Use  . Smoking status: Never Smoker  . Smokeless tobacco: Never Used  Substance and Sexual Activity  . Alcohol use: Yes    Alcohol/week: 3.5 oz    Types: 7 Standard drinks or equivalent per week  . Drug use: No  . Sexual activity: Not on file  Other Topics Concern  . Not on file  Social History Narrative   Married, 1 son and 1 daughter   Self-employed   2 caffienated beverages/day    Past Surgical History:  Procedure Laterality Date  . CESAREAN SECTION     x 2  . COLONOSCOPY    . TOTAL ABDOMINAL HYSTERECTOMY W/ BILATERAL SALPINGOOPHORECTOMY      Family History  Problem Relation Age of Onset  . Hypertension Mother   . Dementia Mother   . Arthritis Mother   . Alzheimer's disease Mother   . Arthritis Father   . Liver disease Father        NASH  . Hypertension Father   . Arthritis Sister   . Liver disease  Sister   . Arthritis Brother   . Liver disease Paternal Aunt   . Liver disease Paternal Aunt   . Liver disease Paternal Aunt   . Cystic fibrosis Other        niece    Allergies  Allergen Reactions  . Penicillins Anaphylaxis    No current outpatient medications on file prior to visit.   Current Facility-Administered Medications on File Prior to Visit  Medication Dose Route Frequency Provider Last Rate Last Dose  . 0.9 %  sodium chloride infusion  500 mL Intravenous Continuous Gatha Mayer, MD        BP (!) 126/56   Pulse 92   Temp 98.3 F (36.8 C) (Oral)   Resp 16   Wt 153 lb 12.8 oz (69.8 kg)   SpO2 100%   BMI 27.24 kg/m       Objective:   Physical Exam  General  Mental  Status - Alert. General Appearance - Well groomed. Not in acute distress.  Skin Rashes- No Rashes.  HEENT Head- Normal. Ear Auditory Canal - Left- Normal. Right - Normal.Tympanic Membrane- Left- Normal. Right- Normal. Eye Sclera/Conjunctiva- Left- Normal. Right- Normal. Nose & Sinuses Nasal Mucosa- Left-  Boggy and Congested. Right-  Boggy and  Congested.Bilateral maxillary and frontal sinus pressure. Mouth & Throat Lips: Upper Lip- Normal: no dryness, cracking, pallor, cyanosis, or vesicular eruption. Lower Lip-Normal: no dryness, cracking, pallor, cyanosis or vesicular eruption. Buccal Mucosa- Bilateral- No Aphthous ulcers. Oropharynx- No Discharge or Erythema. Tonsils: Characteristics- Bilateral- mild  Erythema or Congestion. Size/Enlargement- Bilateral- 1+ enlargement. Discharge- bilateral-None.  Neck Neck- Supple. No Masses. Fiant enlarged submandibular nodes.   Chest and Lung Exam Auscultation: Breath Sounds:-Clear even and unlabored.  Cardiovascular Auscultation:Rythm- Regular, rate and rhythm. Murmurs & Other Heart Sounds:Ausculatation of the heart reveal- No Murmurs.  Lymphatic Head & Neck General Head & Neck Lymphatics: Bilateral: Description- No Localized lymphadenopathy. See neck exam.       Assessment & Plan:  Both your strep test and flu test were negative.  By exam and  clinically I am not convinced that you have either infection.(However of the 2 potential infections strep I  think would be more likely.)  I do have concern that you have early bronchitis and that your illness might be prolonged again up to 3 weeks as you described previously over the past year.  I do think is a good idea to go ahead and start a azithromycin antibiotic, Flonase for nasal congestion, and Hycodan for cough.  Rest and keep yourself hydrated.  Tylenol for fever.  Follow-up in 7-10 days or as needed.  Mackie Pai, PA-C

## 2017-05-06 ENCOUNTER — Ambulatory Visit (HOSPITAL_BASED_OUTPATIENT_CLINIC_OR_DEPARTMENT_OTHER)
Admission: RE | Admit: 2017-05-06 | Discharge: 2017-05-06 | Disposition: A | Discharge status: Home / Self Care | Payer: PPO | Source: Ambulatory Visit | Attending: Medical | Admitting: Medical

## 2017-05-06 ENCOUNTER — Other Ambulatory Visit: Payer: Self-pay | Admitting: Medical

## 2017-05-06 ENCOUNTER — Encounter: Payer: Self-pay | Admitting: Medical

## 2017-05-06 ENCOUNTER — Telehealth: Payer: Self-pay | Admitting: Medical

## 2017-05-06 ENCOUNTER — Encounter (HOSPITAL_BASED_OUTPATIENT_CLINIC_OR_DEPARTMENT_OTHER): Payer: Self-pay

## 2017-05-06 ENCOUNTER — Ambulatory Visit (INDEPENDENT_AMBULATORY_CARE_PROVIDER_SITE_OTHER): Payer: PPO | Admitting: Medical

## 2017-05-06 VITALS — BP 130/78 | HR 71 | Temp 97.6°F | Resp 16 | Ht 62.0 in | Wt 153.0 lb

## 2017-05-06 DIAGNOSIS — Z13820 Encounter for screening for osteoporosis: Secondary | ICD-10-CM | POA: Insufficient documentation

## 2017-05-06 DIAGNOSIS — Z113 Encounter for screening for infections with a predominantly sexual mode of transmission: Secondary | ICD-10-CM | POA: Diagnosis not present

## 2017-05-06 DIAGNOSIS — Z780 Asymptomatic menopausal state: Secondary | ICD-10-CM | POA: Diagnosis not present

## 2017-05-06 DIAGNOSIS — M8580 Other specified disorders of bone density and structure, unspecified site: Secondary | ICD-10-CM

## 2017-05-06 DIAGNOSIS — Z1159 Encounter for screening for other viral diseases: Secondary | ICD-10-CM

## 2017-05-06 DIAGNOSIS — R739 Hyperglycemia, unspecified: Secondary | ICD-10-CM

## 2017-05-06 DIAGNOSIS — M8588 Other specified disorders of bone density and structure, other site: Secondary | ICD-10-CM | POA: Diagnosis not present

## 2017-05-06 DIAGNOSIS — E2839 Other primary ovarian failure: Secondary | ICD-10-CM | POA: Insufficient documentation

## 2017-05-06 DIAGNOSIS — E785 Hyperlipidemia, unspecified: Secondary | ICD-10-CM | POA: Diagnosis not present

## 2017-05-06 DIAGNOSIS — M8589 Other specified disorders of bone density and structure, multiple sites: Secondary | ICD-10-CM | POA: Diagnosis not present

## 2017-05-06 DIAGNOSIS — Z0000 Encounter for general adult medical examination without abnormal findings: Secondary | ICD-10-CM

## 2017-05-06 DIAGNOSIS — Z23 Encounter for immunization: Secondary | ICD-10-CM

## 2017-05-06 DIAGNOSIS — Z1239 Encounter for other screening for malignant neoplasm of breast: Secondary | ICD-10-CM

## 2017-05-06 DIAGNOSIS — Z1231 Encounter for screening mammogram for malignant neoplasm of breast: Secondary | ICD-10-CM | POA: Diagnosis not present

## 2017-05-06 LAB — LIPID PANEL
Cholesterol: 237 mg/dL — ABNORMAL HIGH (ref 0–200)
HDL: 72.5 mg/dL (ref >39.00)
LDL Cholesterol: 151 mg/dL — ABNORMAL HIGH (ref 0–99)
NonHDL: 164.45
Total CHOL/HDL Ratio: 3
Triglycerides: 67 mg/dL (ref 0.0–149.0)
VLDL: 13.4 mg/dL (ref 0.0–40.0)

## 2017-05-06 LAB — COMPREHENSIVE METABOLIC PANEL
ALT: 11 U/L (ref 0–35)
AST: 17 U/L (ref 0–37)
Albumin: 3.9 g/dL (ref 3.5–5.2)
Alkaline Phosphatase: 62 U/L (ref 39–117)
BUN: 17 mg/dL (ref 6–23)
CO2: 29 mEq/L (ref 19–32)
Calcium: 9.7 mg/dL (ref 8.4–10.5)
Chloride: 103 mEq/L (ref 96–112)
Creatinine, Ser: 0.77 mg/dL (ref 0.40–1.20)
GFR: 79.93 mL/min (ref >60.00)
Glucose, Bld: 96 mg/dL (ref 70–99)
Potassium: 4.9 mEq/L (ref 3.5–5.1)
Sodium: 138 mEq/L (ref 135–145)
Total Bilirubin: 0.5 mg/dL (ref 0.2–1.2)
Total Protein: 7 g/dL (ref 6.0–8.3)

## 2017-05-06 NOTE — Telephone Encounter (Signed)
Future order vitamin D placed.

## 2017-05-06 NOTE — Telephone Encounter (Signed)
Will you look and see if patient has any copies of advance directives that she filled out.  If not can you look for our standard forms that we can get her to fill out.  I Was Looking for after her welcome to Medicare exam and never found it in epic.  Let me know what she says.  If she has advanced directive paperwork/medical power of attorney type info filled out and would you ask her to bring by copy so we can place it in our records.

## 2017-05-06 NOTE — Progress Notes (Signed)
Subjective:    Patient ID: Stephanie [name], female    DOB: 04-Aug-1950, 67 y.o.   MRN: [id number]  HPI   Pt in today states feeling fine.  Pt in for Welcome to medicare exam.(She was just wanting labs).  Pt bmi, height and weight reviewed.No formal exercise. Watches granchildren.  Pt reports some mild depression in winter only. During other seasons feels fine. She fills out depression screen today and score low of only 3.  Pt has good activities of daily living on review.  Pt sees some specialist. Pt sees- Dr. Carlean Purl. GI. Dr. Jabier Mutton Optometrist  Pt had dexa scan more than 10 years ago.  Pt needs a mammogram.   Pt overdue for papsmear.(declines referral since had hysterectomy) Hep C screening can be done. psv due.  Hiv will be ordered today.     Review of Systems  Constitutional: Negative for appetite change, chills and fatigue.  HENT: Positive for ear pain. Negative for congestion and drooling.        Left ear pressure recently while [place] to her [place] home.   Eyes: Negative for photophobia, pain and itching.  Respiratory: Negative for cough, choking, chest tightness and wheezing.   Cardiovascular: Negative for chest pain and palpitations.  Gastrointestinal: Negative for abdominal pain.  Genitourinary: Negative for dyspareunia and dysuria.  Musculoskeletal: Positive for arthralgias. Negative for back pain.       Mostly in her hands. Pt can't tolerates nsaids.  Skin: Negative for pallor and rash.  Neurological: Negative for facial asymmetry, speech difficulty, weakness and numbness.  Hematological: Negative for adenopathy. Does not bruise/bleed easily.  Psychiatric/Behavioral: Positive for sleep disturbance. Negative for agitation, decreased concentration, hallucinations and suicidal ideas. The patient is not nervous/anxious and is not hyperactive.        Trouble falling and staying asleep.   Past Medical History:  Diagnosis Date  . Arthritis   .  Cataract    beginning signs of cataract  . Complication of anesthesia    Per pt, hard to wake up past c-section/ 1 time.  . DDD (degenerative disc disease)    in feet and hands  . GERD (gastroesophageal reflux disease)   . Hx of colonic polyps 08/20/2016  . Hyperlipidemia   . Lactose intolerance      Social History   Socioeconomic History  . Marital status: Married    Spouse name: Not on file  . Number of children: 2  . Years of education: Not on file  . Highest education level: Not on file  Social Needs  . Financial resource strain: Not on file  . Food insecurity - worry: Not on file  . Food insecurity - inability: Not on file  . Transportation needs - medical: Not on file  . Transportation needs - non-medical: Not on file  Occupational History  . Occupation: self  Tobacco Use  . Smoking status: Never Smoker  . Smokeless tobacco: Never Used  Substance and Sexual Activity  . Alcohol use: Yes    Alcohol/week: 3.5 oz    Types: 7 Standard drinks or equivalent per week  . Drug use: No  . Sexual activity: Not on file  Other Topics Concern  . Not on file  Social History Narrative   Married, 1 son and 1 daughter   Self-employed   2 caffienated beverages/day    Past Surgical History:  Procedure Laterality Date  . CESAREAN SECTION     x 2  . COLONOSCOPY    .  TOTAL ABDOMINAL HYSTERECTOMY W/ BILATERAL SALPINGOOPHORECTOMY      Family History  Problem Relation Age of Onset  . Hypertension Mother   . Dementia Mother   . Arthritis Mother   . Alzheimer's disease Mother   . Arthritis Father   . Liver disease Father        NASH  . Hypertension Father   . Arthritis Sister   . Liver disease Sister   . Arthritis Brother   . Liver disease Paternal Aunt   . Liver disease Paternal Aunt   . Liver disease Paternal Aunt   . Cystic fibrosis Other        niece    Allergies  Allergen Reactions  . Penicillins Anaphylaxis    Current Outpatient Medications on File Prior to  Visit  Medication Sig Dispense Refill  . fluticasone (FLONASE) 50 MCG/ACT nasal spray Place 2 sprays into both nostrils daily. 16 g 1   Current Facility-Administered Medications on File Prior to Visit  Medication Dose Route Frequency Provider Last Rate Last Dose  . 0.9 %  sodium chloride infusion  500 mL Intravenous Continuous Gatha Mayer, MD        BP (!) 144/77   Pulse 71   Temp 97.6 F (36.4 C) (Oral)   Resp 16   Ht 5\' 2"  (1.575 m)   Wt 153 lb (69.4 kg)   SpO2 99%   BMI 27.98 kg/m       Objective:   Physical Exam  General Mental Status- Alert. General Appearance- Not in acute distress.   Skin General: Color- Normal Color. Moisture- Normal Moisture.  Neck Carotid Arteries- Normal color. Moisture- Normal Moisture. No carotid bruits. No JVD.  Chest and Lung Exam Auscultation: Breath Sounds:-Normal.  Cardiovascular Auscultation:Rythm- Regular. Murmurs & Other Heart Sounds:Auscultation of the heart reveals- No Murmurs.  Abdomen Inspection:-Inspeection Normal. Palpation/Percussion:Note:No mass. Palpation and Percussion of the abdomen reveal- Non Tender, Non Distended + BS, no rebound or guarding.  Neurologic Cranial Nerve exam:- CN III-XII intact(No nystagmus), symmetric smile. Strength:- 5/5 equal and symmetric strength both upper and lower extremities.      Assessment & Plan:  For you hx of high cholesterol we got cmp and lipid panel today. Also for your welcome to Medicare exam I ordered hiv screen and hcv antibody as screening.  In addition order placed for mammogram and DEXA scan.  Vaccine given today. We updated psv 13. Will get psv 23 in one year.  Recommend exercise and healthy diet.  We will let you know lab results as they come in.  Order for screening mammogram and dexascan placed.  For ear pressure if persists let us now. Continue flonase.  Follow up date appointment will be determined after lab review.   Depression screen reviewed today  and 3 score.   Patient did decline offer for Astelin nasal spray.  She does not take NSAIDs.  She did mention some insomnia and prior use of Ambien.  I will defer prescription of that to her PCP.    NoteI did review the chart later after patient left and did not find any information on her filling out advanced directives.  We will asked my medical assistant to review the chart and if patient did not fill that out in the past will ask her to come back for paperwork. Note also Mackie Pai, PA-C

## 2017-05-06 NOTE — Patient Instructions (Addendum)
For you hx of high cholesterol we got cmp and lipid panel today. Also for your welcome to Medicare exam I ordered hiv screen and hcv antibody as screening.  Order placed for mammogram and DEXA scan.  Vaccine given today. We updated psv 13. Will get psv 23 in one year.  Recommend exercise and healthy diet.  We will let you know lab results as they come in.  Order for screening mammogram and dexascan placed.  For ear pressure if persists let us now. Continue flonase.  Follow up date appointment will be determined after lab review.    Preventive Care 40-64 Years, Female Preventive care refers to lifestyle choices and visits with your health care provider that can promote health and wellness. What does preventive care include?  A yearly physical exam. This is also called an annual well check.  Dental exams once or twice a year.  Routine eye exams. Ask your health care provider how often you should have your eyes checked.  Personal lifestyle choices, including: ? Daily care of your teeth and gums. ? Regular physical activity. ? Eating a healthy diet. ? Avoiding tobacco and drug use. ? Limiting alcohol use. ? Practicing safe sex. ? Taking low-dose aspirin daily starting at age 19. ? Taking vitamin and mineral supplements as recommended by your health care provider. What happens during an annual well check? The services and screenings done by your health care provider during your annual well check will depend on your age, overall health, lifestyle risk factors, and family history of disease. Counseling Your health care provider may ask you questions about your:  Alcohol use.  Tobacco use.  Drug use.  Emotional well-being.  Home and relationship well-being.  Sexual activity.  Eating habits.  Work and work Statistician.  Method of birth control.  Menstrual cycle.  Pregnancy history.  Screening You may have the following tests or measurements:  Height, weight, and  BMI.  Blood pressure.  Lipid and cholesterol levels. These may be checked every 5 years, or more frequently if you are over 66 years old.  Skin check.  Lung cancer screening. You may have this screening every year starting at age 71 if you have a 30-pack-year history of smoking and currently smoke or have quit within the past 15 years.  Fecal occult blood test (FOBT) of the stool. You may have this test every year starting at age 25.  Flexible sigmoidoscopy or colonoscopy. You may have a sigmoidoscopy every 5 years or a colonoscopy every 10 years starting at age 82.  Hepatitis C blood test.  Hepatitis B blood test.  Sexually transmitted disease (STD) testing.  Diabetes screening. This is done by checking your blood sugar (glucose) after you have not eaten for a while (fasting). You may have this done every 1-3 years.  Mammogram. This may be done every 1-2 years. Talk to your health care provider about when you should start having regular mammograms. This may depend on whether you have a family history of breast cancer.  BRCA-related cancer screening. This may be done if you have a family history of breast, ovarian, tubal, or peritoneal cancers.  Pelvic exam and Pap test. This may be done every 3 years starting at age 57. Starting at age 71, this may be done every 5 years if you have a Pap test in combination with an HPV test.  Bone density scan. This is done to screen for osteoporosis. You may have this scan if you are at high risk  for osteoporosis.  Discuss your test results, treatment options, and if necessary, the need for more tests with your health care provider. Vaccines Your health care provider may recommend certain vaccines, such as:  Influenza vaccine. This is recommended every year.  Tetanus, diphtheria, and acellular pertussis (Tdap, Td) vaccine. You may need a Td booster every 10 years.  Varicella vaccine. You may need this if you have not been vaccinated.  Zoster  vaccine. You may need this after age 31.  Measles, mumps, and rubella (MMR) vaccine. You may need at least one dose of MMR if you were born in 1957 or later. You may also need a second dose.  Pneumococcal 13-valent conjugate (PCV13) vaccine. You may need this if you have certain conditions and were not previously vaccinated.  Pneumococcal polysaccharide (PPSV23) vaccine. You may need one or two doses if you smoke cigarettes or if you have certain conditions.  Meningococcal vaccine. You may need this if you have certain conditions.  Hepatitis A vaccine. You may need this if you have certain conditions or if you travel or work in places where you may be exposed to hepatitis A.  Hepatitis B vaccine. You may need this if you have certain conditions or if you travel or work in places where you may be exposed to hepatitis B.  Haemophilus influenzae type b (Hib) vaccine. You may need this if you have certain conditions.  Talk to your health care provider about which screenings and vaccines you need and how often you need them. This information is not intended to replace advice given to you by your health care provider. Make sure you discuss any questions you have with your health care provider. Document Released: 03/24/2015 Document Revised: 11/15/2015 Document Reviewed: 12/27/2014 Elsevier Interactive Patient Education  [employer].

## 2017-05-07 ENCOUNTER — Encounter: Payer: Self-pay | Admitting: Medical

## 2017-05-07 LAB — HIV ANTIBODY (ROUTINE TESTING W REFLEX): HIV 1&2 Ab, 4th Generation: NONREACTIVE

## 2017-05-07 LAB — HEPATITIS C ANTIBODY
Hepatitis C Ab: NONREACTIVE
SIGNAL TO CUT-OFF: 0.01 (ref <1.00)

## 2017-05-07 NOTE — Telephone Encounter (Signed)
Where do I find this

## 2017-05-08 NOTE — Telephone Encounter (Signed)
Under Demographics, Advanced Directive tab; she is not showing any Living Will and/or Power of Attorney on file/SLS

## 2018-01-26 ENCOUNTER — Encounter: Payer: Self-pay | Admitting: Family Medicine

## 2018-01-26 ENCOUNTER — Ambulatory Visit (INDEPENDENT_AMBULATORY_CARE_PROVIDER_SITE_OTHER): Payer: PPO | Admitting: Family Medicine

## 2018-01-26 VITALS — BP 135/71 | HR 76 | Temp 98.2°F | Resp 16 | Ht 62.0 in | Wt 153.0 lb

## 2018-01-26 DIAGNOSIS — G4700 Insomnia, unspecified: Secondary | ICD-10-CM

## 2018-01-26 DIAGNOSIS — G2581 Restless legs syndrome: Secondary | ICD-10-CM

## 2018-01-26 DIAGNOSIS — R0683 Snoring: Secondary | ICD-10-CM | POA: Diagnosis not present

## 2018-01-26 MED ORDER — ROPINIROLE HCL 0.25 MG PO TABS: ORAL_TABLET | ORAL | 0 refills | Status: DC

## 2018-01-26 NOTE — Patient Instructions (Signed)

## 2018-01-26 NOTE — Assessment & Plan Note (Signed)
No tv at night or late night eating No caffeine Refer to neuro for sleep eval

## 2018-01-26 NOTE — Assessment & Plan Note (Signed)
Refer to neuro 

## 2018-01-26 NOTE — Progress Notes (Signed)
Patient ID: Stephanie [name], female    DOB: [birth year]  Age: 67 y.o. MRN: [id number]    Subjective:  Subjective  HPI ERCIA CRISAFULLI presents for insomnia.  She used to fall asleep and wake up -- now she can not fall asleep.  She also has restless legs and she snores.    Review of Systems  Constitutional: Positive for fatigue. Negative for activity change, appetite change, chills, fever and unexpected weight change.  Respiratory: Negative for cough and shortness of breath.   Cardiovascular: Negative for chest pain and palpitations.  Gastrointestinal: Negative for abdominal distention and abdominal pain.  Genitourinary: Negative for difficulty urinating, dyspareunia, dysuria, flank pain, frequency, genital sores, hematuria, menstrual problem, pelvic pain, urgency, vaginal discharge and vaginal pain.  Musculoskeletal: Negative for back pain.  Psychiatric/Behavioral: Negative for behavioral problems and dysphoric mood. The patient is not nervous/anxious.     History Past Medical History:  Diagnosis Date  . Arthritis   . Cataract    beginning signs of cataract  . Complication of anesthesia    Per pt, hard to wake up past c-section/ 1 time.  . DDD (degenerative disc disease)    in feet and hands  . GERD (gastroesophageal reflux disease)   . Hx of colonic polyps 08/20/2016  . Hyperlipidemia   . Lactose intolerance     She has a past surgical history that includes Total abdominal hysterectomy w/ bilateral salpingoophorectomy; Cesarean section; and Colonoscopy.   Her family history includes Alzheimer's disease in her mother; Arthritis in her brother, father, mother, and sister; Cystic fibrosis in her other; Dementia in her mother; Hypertension in her father and mother; Liver disease in her father, paternal aunt, paternal aunt, paternal aunt, and sister.She reports that she has never smoked. She has never used smokeless tobacco. She reports that she drinks about 7.0 standard drinks of alcohol  per week. She reports that she does not use drugs.  No current outpatient medications on file prior to visit.   Current Facility-Administered Medications on File Prior to Visit  Medication Dose Route Frequency Provider Last Rate Last Dose  . 0.9 %  sodium chloride infusion  500 mL Intravenous Continuous Gatha Mayer, MD         Objective:  Objective  Physical Exam  Constitutional: She is oriented to person, place, and time. She appears well-developed and well-nourished.  HENT:  Head: Normocephalic and atraumatic.  Eyes: Conjunctivae and EOM are normal.  Neck: Normal range of motion. Neck supple. No JVD present. Carotid bruit is not present. No thyromegaly present.  Cardiovascular: Normal rate, regular rhythm and normal heart sounds.  No murmur heard. Pulmonary/Chest: Effort normal and breath sounds normal. No respiratory distress. She has no wheezes. She has no rales. She exhibits no tenderness.  Musculoskeletal: She exhibits no edema.  Neurological: She is alert and oriented to person, place, and time.  Psychiatric: She has a normal mood and affect.  Nursing note and vitals reviewed.  BP 135/71 (BP Location: Right Arm, Cuff Size: Normal)   Pulse 76   Temp 98.2 F (36.8 C) (Oral)   Resp 16   Ht 5\' 2"  (1.575 m)   Wt 153 lb (69.4 kg)   SpO2 99%   BMI 27.98 kg/m  Wt Readings from Last 3 Encounters:  01/26/18 153 lb (69.4 kg)  05/06/17 153 lb (69.4 kg)  04/23/17 153 lb 12.8 oz (69.8 kg)     Lab Results  Component Value Date   WBC 7.3 04/13/2015  HGB 14.1 04/13/2015   HCT 43.0 04/13/2015   PLT 230.0 04/13/2015   GLUCOSE 96 05/06/2017   CHOL 237 (H) 05/06/2017   TRIG 67.0 05/06/2017   HDL 72.50 05/06/2017   LDLDIRECT 103.2 10/15/2011   LDLCALC 151 (H) 05/06/2017   ALT 11 05/06/2017   AST 17 05/06/2017   NA 138 05/06/2017   K 4.9 05/06/2017   CL 103 05/06/2017   CREATININE 0.77 05/06/2017   BUN 17 05/06/2017   CO2 29 05/06/2017   TSH 3.15 04/13/2015    Dg  Bone Density  Result Date: 05/06/2017 EXAM: DUAL X-RAY ABSORPTIOMETRY (DXA) FOR BONE MINERAL DENSITY IMPRESSION: Referring Physician:  Mackie Pai PATIENT: Name: Jamilex, Bohnsack Patient ID: [id number] Birth Date: 1951-02-27 Height: 63.5 in. Sex: Female Measured: 05/06/2017 Weight: 151.4 lbs. Indications: Caucasian, Estrogen Deficiency, Hysterectomy, Oophorectomy ( Bilateral), Post Menopausal Fractures: Foot Treatments: HRT ASSESSMENT: The BMD measured at AP Spine L1-L4 is 0.958 g/cm2 with a T-score of -1.9. This patient is considered osteopenic according to [facility]) criteria. Site Region Measured Date Measured Age WHO YA BMD Classification T-score AP Spine L1-L4 05/06/2017 65.1 Low Bone Mass -1.9 0.958 g/cm2 DualFemur Neck Right 05/06/2017 65.1 [age] Low Bone Mass -1.5 0.830 g/cm2 World Health Organization [facility]) criteria for post-menopausal, Caucasian Women: Normal        T-score at or above -1 SD Low Bone Mass T-score between -1 and -2.5 SD Osteoporosis  T-score at or below -2.5 SD RECOMMENDATION: [place] recommends that FDA-approved medical therapies be considered in postmenopausal women and men age 59 or older with a: 1. Hip or vertebral (clinical or morphometric) fracture. 2. T-score of < -2.5 at the spine or hip. 3. Ten-year fracture probability by FRAX of 3% or greater for hip fracture or 20% or greater for major osteoporotic fracture. All treatment decisions require clinical judgment and consideration of individual patient factors, including patient preferences, co-morbidities, previous drug use, risk factors not captured in the FRAX model (e.g. falls, vitamin D deficiency, increased bone turnover, interval significant decline in bone density) and possible under - or over-estimation of fracture risk by FRAX. All patients should ensure an adequate intake of dietary calcium (1200 mg/d) and vitamin D (800 IU daily) unless contraindicated. FOLLOW-UP: People with  diagnosed cases of osteoporosis or at high risk for fracture should have regular bone mineral density tests. For patients eligible for Medicare, routine testing is allowed once every 2 years. The testing frequency can be increased to one year for patients who have rapidly progressing disease, those who are receiving or discontinuing medical therapy to restore bone mass, or have additional risk factors. I have reviewed this report and agree with the above findings. [facility] Radiology Patient: Ariany, Kesselman Referring Physician: Mackie Pai Birth Date: 1950-12-27 [age] Patient ID: [id number] Height: 63.5 in. Weight: 151.4 lbs. Measured: 05/06/2017 9:36:48 AM (16 SP 2) Sex: Female Ethnicity: White Analyzed: 05/06/2017 9:39:06 AM (16 SP 2) FRAX* 10-year Probability of Fracture Based on femoral neck BMD: DualFemur (Right) Major Osteoporotic Fracture: 8.9% Hip Fracture:                1.0% Population:                  [place] (Caucasian) Risk Factors:                None *FRAX is a Materials engineer of the [employer] for Metabolic Bone  Disease, a World Pharmacologist (WHO) [facility]. ASSESSMENT: The probability of a major osteoporotic fracture is 8.9% within the next ten years. The probability of a hip fracture is 1.0% within the next ten years. Electronically Signed   By: Lowella Grip III M.D.   On: 05/06/2017 10:55   Mm Screening Breast Tomo Bilateral  Result Date: 05/06/2017 CLINICAL DATA:  Screening. EXAM: DIGITAL SCREENING BILATERAL MAMMOGRAM WITH TOMO AND CAD COMPARISON:  Previous exam(s). ACR Breast Density Category b: There are scattered areas of fibroglandular density. FINDINGS: There are no findings suspicious for malignancy. Images were processed with CAD. IMPRESSION: No mammographic evidence of malignancy. A result letter of this screening mammogram will be mailed directly to the patient. RECOMMENDATION: Screening mammogram in one year.  (Code:SM-B-01Y) BI-RADS CATEGORY  1: Negative. Electronically Signed   By: Dorise Bullion III M.D   On: 05/06/2017 16:12     Assessment & Plan:  Plan  I have discontinued [name] "Pam"'s fluticasone. I am also having her start on rOPINIRole. We will continue to administer sodium chloride.  Meds ordered this encounter  Medications  . rOPINIRole (REQUIP) 0.25 MG tablet    Sig: 1 po qhs x 2-3 days then may increase to 2 po qhs for 2-3 days--- if needed increase to 3 and call office    Dispense:  60 tablet    Refill:  0    Problem List Items Addressed This Visit      Unprioritized   Insomnia    No tv at night or late night eating No caffeine Refer to neuro for sleep eval      Relevant Orders   Ambulatory referral to Neurology   Restless legs - Primary   Relevant Medications   rOPINIRole (REQUIP) 0.25 MG tablet   Other Relevant Orders   CBC with Differential/Platelet   Comprehensive metabolic panel   Ferritin   IBC panel   Snores    Refer to neuro          Follow-up: Return if symptoms worsen or fail to improve.  [name], DO

## 2018-01-30 ENCOUNTER — Other Ambulatory Visit (INDEPENDENT_AMBULATORY_CARE_PROVIDER_SITE_OTHER): Payer: PPO

## 2018-01-30 DIAGNOSIS — M8580 Other specified disorders of bone density and structure, unspecified site: Secondary | ICD-10-CM | POA: Diagnosis not present

## 2018-01-30 DIAGNOSIS — G2581 Restless legs syndrome: Secondary | ICD-10-CM

## 2018-01-30 LAB — CBC WITH DIFFERENTIAL/PLATELET
Basophils Absolute: 0.1 10*3/uL (ref 0.0–0.1)
Basophils Relative: 1.3 % (ref 0.0–3.0)
Eosinophils Absolute: 0.1 10*3/uL (ref 0.0–0.7)
Eosinophils Relative: 2.5 % (ref 0.0–5.0)
HCT: 40.9 % (ref 36.0–46.0)
Hemoglobin: 13.7 g/dL (ref 12.0–15.0)
Lymphocytes Relative: 45.1 % (ref 12.0–46.0)
Lymphs Abs: 2.1 10*3/uL (ref 0.7–4.0)
MCHC: 33.6 g/dL (ref 30.0–36.0)
MCV: 93.3 fl (ref 78.0–100.0)
Monocytes Absolute: 0.5 10*3/uL (ref 0.1–1.0)
Monocytes Relative: 10.5 % (ref 3.0–12.0)
Neutro Abs: 1.9 10*3/uL (ref 1.4–7.7)
Neutrophils Relative %: 40.6 % — ABNORMAL LOW (ref 43.0–77.0)
Platelets: 222 10*3/uL (ref 150.0–400.0)
RBC: 4.39 Mil/uL (ref 3.87–5.11)
RDW: 12.8 % (ref 11.5–15.5)
WBC: 4.7 10*3/uL (ref 4.0–10.5)

## 2018-01-30 LAB — COMPREHENSIVE METABOLIC PANEL
ALT: 12 U/L (ref 0–35)
AST: 17 U/L (ref 0–37)
Albumin: 4.2 g/dL (ref 3.5–5.2)
Alkaline Phosphatase: 59 U/L (ref 39–117)
BUN: 13 mg/dL (ref 6–23)
CO2: 27 mEq/L (ref 19–32)
Calcium: 9.3 mg/dL (ref 8.4–10.5)
Chloride: 106 mEq/L (ref 96–112)
Creatinine, Ser: 0.85 mg/dL (ref 0.40–1.20)
GFR: 71.15 mL/min (ref >60.00)
Glucose, Bld: 93 mg/dL (ref 70–99)
Potassium: 5 mEq/L (ref 3.5–5.1)
Sodium: 141 mEq/L (ref 135–145)
Total Bilirubin: 0.7 mg/dL (ref 0.2–1.2)
Total Protein: 6.6 g/dL (ref 6.0–8.3)

## 2018-01-30 LAB — IBC PANEL
Iron: 119 ug/dL (ref 42–145)
Saturation Ratios: 41.3 % (ref 20.0–50.0)
Transferrin: 206 mg/dL — ABNORMAL LOW (ref 212.0–360.0)

## 2018-01-30 LAB — FERRITIN: Ferritin: 88.9 ng/mL (ref 10.0–291.0)

## 2018-01-30 LAB — VITAMIN D 25 HYDROXY (VIT D DEFICIENCY, FRACTURES): VITD: 17.84 ng/mL — ABNORMAL LOW (ref 30.00–100.00)

## 2018-01-31 ENCOUNTER — Telehealth: Payer: Self-pay | Admitting: Medical

## 2018-01-31 ENCOUNTER — Encounter: Payer: Self-pay | Admitting: Family Medicine

## 2018-01-31 MED ORDER — VITAMIN D (ERGOCALCIFEROL) 1.25 MG (50000 UNIT) PO CAPS: 50000.0000 [IU] | ORAL_CAPSULE | ORAL | 0 refills | Status: DC

## 2018-01-31 NOTE — Telephone Encounter (Signed)
Rx ergocalciferol sent to pt pharmacy.

## 2018-02-02 NOTE — Telephone Encounter (Signed)
We would need to do labs----- thyroid panal What problems with the thyroid ----?  Hypo or hyper thyroid?

## 2018-03-05 ENCOUNTER — Other Ambulatory Visit: Payer: Self-pay | Admitting: Family Medicine

## 2018-03-05 DIAGNOSIS — G2581 Restless legs syndrome: Secondary | ICD-10-CM

## 2018-03-05 MED ORDER — ROPINIROLE HCL 0.25 MG PO TABS: ORAL_TABLET | ORAL | 5 refills | Status: DC

## 2018-03-30 ENCOUNTER — Other Ambulatory Visit: Payer: Self-pay | Admitting: Family Medicine

## 2018-03-30 ENCOUNTER — Encounter: Payer: Self-pay | Admitting: Neurology

## 2018-03-30 ENCOUNTER — Ambulatory Visit (INDEPENDENT_AMBULATORY_CARE_PROVIDER_SITE_OTHER): Payer: PPO | Admitting: Neurology

## 2018-03-30 VITALS — BP 138/80 | HR 75 | Ht 63.0 in | Wt 152.0 lb

## 2018-03-30 DIAGNOSIS — G4701 Insomnia due to medical condition: Secondary | ICD-10-CM

## 2018-03-30 DIAGNOSIS — R0683 Snoring: Secondary | ICD-10-CM

## 2018-03-30 DIAGNOSIS — M51379 Other intervertebral disc degeneration, lumbosacral region without mention of lumbar back pain or lower extremity pain: Secondary | ICD-10-CM

## 2018-03-30 DIAGNOSIS — G8929 Other chronic pain: Secondary | ICD-10-CM

## 2018-03-30 DIAGNOSIS — M5137 Other intervertebral disc degeneration, lumbosacral region: Secondary | ICD-10-CM | POA: Diagnosis not present

## 2018-03-30 DIAGNOSIS — M069 Rheumatoid arthritis, unspecified: Secondary | ICD-10-CM

## 2018-03-30 DIAGNOSIS — G4761 Periodic limb movement disorder: Secondary | ICD-10-CM | POA: Diagnosis not present

## 2018-03-30 DIAGNOSIS — G2581 Restless legs syndrome: Secondary | ICD-10-CM

## 2018-03-30 MED ORDER — ROPINIROLE HCL 0.5 MG PO TABS: 0.2500 mg | ORAL_TABLET | Freq: Every day | ORAL | 3 refills | Status: DC

## 2018-03-30 NOTE — Progress Notes (Signed)
SLEEP MEDICINE CLINIC   Provider:  Larey Seat, M D  Primary Care Physician:  Carollee Herter, Alferd Apa, DO   Referring Provider: Carollee Herter, Alferd Apa, *    Chief Complaint  Patient presents with  . New Patient (Initial Visit)    pt alone, rm 10. pt states she saw her PCP and was having about 4 mths where she was only sleeping 2-3 hrs a night, she was diagnosed by PCP with RLS and she was taking medication for that. she states since starting the medication she sleeps a little better,but wakes up around 2-2:30 am. pt has been told she snores in sleep. never had a sleep study completed before.     HPI:  Stephanie [name] is a 68 y.o. female patient , seen here on 03-30-2018 in a referral from Dr. Carollee Herter for sleep apnea, RLS. She started on Requip, kept a low dose: " I metabolize drugs very slowly" . Takes one hour to work and allows her to go to the movies, theater and sit still.   Chief complaint according to patient : RLS /She started on Requip, kept a low dose: " I metabolize drugs very slowly" . Takes one hour to work and allows her to go to the movies, theater , travel by plane or train -and sit still.   Sleep habits are as follows: she stops caffeine after noon, she may have dinner with wine or a mixed drink, can't sleep well after red wine.  Supper time is between 6 and 7 PM, no physical activity after super. Not often going out, and can't drive at night (vision related). Bedtime 10 PM, established routine. She takes Requip 60 minutes before TV time at 7 Pm.  The bedroom is cool, quiet and dark, shares with husband , who likes the bedroom warmer. She uses a weighted comforter, her duvet is heavy.  She falls asleep on her left side, on 2 down pillows. She has osteoarthritis and her hands cradle the pillow. Her sleep is less restorative, always wakes at 2.30 AM and sometimes unable to go back to sleep.  RLS set in again.  Rises at 5.30 AM, but would love to stay in bed - the RLS  makes it difficult, she also feels hot. Less than 6 hours on average of sleep- 4 hours are good sleep. sometimes dreams.   Sleep medical history: RLS slowly evolved , first noted on the plane to [place] in 2006. She had a migraine from tension, trying to suppress the movements. This was after a hysterectomy.   Family sleep history: father and all 3 siblings have RLS !    Social history: Married, 2 children, 52 year old daughter (DDD) and 79 year old son with epilepsy, word finding difficulties, ADD after epilepsy related to encephalitis.   Non smoker, 5-7 alcoholic bev a week. 2 coffees in AM, rare tea, no sodas.  No shift work history.  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   Review of Systems: Out of a complete 14 system review, the patient complains of only the following symptoms, and all other reviewed systems are negative.  Word-retrieving was always difficult, wrote lectures in long hand. Believes she has ADD. Has affected the high school career.  snorer- RLS, kids say it's really loud.  Lactose intolerant - and both children are , too.   Epworth Sleepiness score at 3/ 24  , Fatigue severity score: 24/  43., depression score:    Social History   Socioeconomic History  . Marital status: Married    Spouse name: Not on file  . Number of children: 2  . Years of education: Not on file  . Highest education level: Not on file  Occupational History  . Occupation: self  Social Needs  . Financial resource strain: Not on file  . Food insecurity:    Worry: Not on file    Inability: Not on file  . Transportation needs:    Medical: Not on file    Non-medical: Not on file  Tobacco Use  . Smoking status: Never Smoker  . Smokeless tobacco: Never Used  Substance and Sexual Activity  . Alcohol use: Yes    Alcohol/week: 7.0 standard drinks    Types: 7 Standard drinks or equivalent per week  . Drug use: No  . Sexual activity:  Not on file  Lifestyle  . Physical activity:    Days per week: Not on file    Minutes per session: Not on file  . Stress: Not on file  Relationships  . Social connections:    Talks on phone: Not on file    Gets together: Not on file    Attends religious service: Not on file    Active member of club or organization: Not on file    Attends meetings of clubs or organizations: Not on file    Relationship status: Not on file  . Intimate partner violence:    Fear of current or ex partner: Not on file    Emotionally abused: Not on file    Physically abused: Not on file    Forced sexual activity: Not on file  Other Topics Concern  . Not on file  Social History Narrative   Married, 1 son and 1 daughter   Self-employed   2 caffienated beverages/day    Family History  Problem Relation Age of Onset  . Hypertension Mother   . Dementia Mother   . Arthritis Mother   . Alzheimer's disease Mother   . Arthritis Father   . Liver disease Father        NASH  . Hypertension Father   . Arthritis Sister   . Liver disease Sister   . Arthritis Brother   . Liver disease Paternal Aunt   . Liver disease Paternal Aunt   . Liver disease Paternal Aunt   . Cystic fibrosis Other        niece    Past Medical History:  Diagnosis Date  . Arthritis   . Cataract    beginning signs of cataract  . Complication of anesthesia    Per pt, hard to wake up past c-section/ 1 time.  . DDD (degenerative disc disease)    in feet and hands  . GERD (gastroesophageal reflux disease)   . Hx of colonic polyps 08/20/2016  . Hyperlipidemia   . Lactose intolerance     Past Surgical History:  Procedure Laterality Date  . CESAREAN SECTION     x 2  . COLONOSCOPY    . TOTAL ABDOMINAL HYSTERECTOMY W/ BILATERAL SALPINGOOPHORECTOMY      Current Outpatient Medications  Medication Sig Dispense Refill  . Calcium Carbonate-Vit D-Min (CALCIUM 1200 PO) Take 1 tablet by mouth daily.    Marland Kitchen DOXYLAMINE SUCCINATE PO Take  25 mg by mouth at bedtime.    . IBUPROFEN PO Take 250 mg by mouth every 6 (six) hours as needed.    Marland Kitchen  Melatonin 10 MG TABS Take 1 tablet by mouth at bedtime.    Marland Kitchen rOPINIRole (REQUIP) 0.5 MG tablet Take 0.25 mg by mouth at bedtime. Patient taking 2 at bedtime and then may increase to 3 if needed    . VITAMIN D PO Take 1 tablet by mouth daily.    . Vitamin D, Ergocalciferol, (DRISDOL) 1.25 MG (50000 UT) CAPS capsule Take 1 capsule (50,000 Units total) by mouth every 7 (seven) days. 8 capsule 0   Current Facility-Administered Medications  Medication Dose Route Frequency Provider Last Rate Last Dose  . 0.9 %  sodium chloride infusion  500 mL Intravenous Continuous Gatha Mayer, MD        Allergies as of 03/30/2018 - Review Complete 03/30/2018  Allergen Reaction Noted  . Penicillins Anaphylaxis 04/23/2007    Vitals: BP 138/80   Pulse 75   Ht 5\' 3"  (1.6 m)   Wt 152 lb (68.9 kg)   BMI 26.93 kg/m  Last Weight:  Wt Readings from Last 1 Encounters:  03/30/18 152 lb (68.9 kg)   TKW:IOXB mass index is 26.93 kg/m.     Last Height:   Ht Readings from Last 1 Encounters:  03/30/18 5\' 3"  (1.6 m)    Physical exam:  General: The patient is awake, alert and appears not in acute distress. The patient is well groomed. Head: Normocephalic, atraumatic. Neck is supple. Mallampati:,2 neck circumference: 14.5 ". Nasal airflow patent , TMJ click evident . Bruxism-  Retrognathia is not seen.  Cardiovascular:  Regular rate and rhythm, without  murmurs or carotid bruit, and without distended neck veins. Respiratory: Lungs are clear to auscultation. Skin:  Without evidence of edema, or rash Trunk: BMI is 27 kg/ m2. The patient's posture is relaxed.   Neurologic exam : The patient is awake and alert, oriented to place and time.   Memory subjective described as intact.  Attention span & concentration ability appears normal.  Speech is fluent,  without dysarthria, dysphonia or aphasia.  Mood and  affect are appropriate.  Cranial nerves: Pupils are equal and briskly reactive to light. Funduscopic exam early cataract- can't state evidence of pallor or edema.  Extraocular movements  in vertical and horizontal planes intact and without nystagmus. Visual fields by finger perimetry are intact. Hearing to finger rub intact, but she reports hearing loss in conversation. She can hear the TV.  Facial sensation intact to fine touch. Facial motor strength is symmetric and tongue and uvula move midline. Shoulder shrug was symmetrical.   Motor exam:  Normal tone, muscle bulk and symmetric strength in all extremities. Sensory:  Fine touch, pinprick and vibration were tested in all extremities. Proprioception tested in the upper extremities was normal. She has swollen joints at the hands, interdigital. Metacarpal.   Coordination: Rapid alternating movements in the fingers/hands was normal. Finger-to-nose maneuver  normal without evidence of ataxia, dysmetria or tremor. Gait and station: Patient walks without assistive device . Reports no difficulties  to climb up to the exam table. Strength within normal limits. Stance is stable and normal. Turns with 3 Steps. Romberg testing is  negative.  Deep tendon reflexes: in the  upper and lower extremities are symmetric and intact. Babinski maneuver response is downgoing.   Assessment:  After physical and neurologic examination, review of laboratory studies,  Personal review of imaging studies, reports of other /same  Imaging studies, results of polysomnography and / or neurophysiology testing and pre-existing records as far as provided in visit., my assessment  is:   1) PLM/ RLS started after a hysterectomy- noted on a long distance flight. This progressed and has now been better controlled on Requip, but she takes only a low dose.  2) pain through DJD with preserved reflexes, sensory. Has arthritis and changes in finger joints, a lot of pain, less grip, changed  penmanship. She was able to take VIOX , now off market,   3) snoring- unsure if apnea plays a role.   OCD, ADD - may contribute to sleep disorder  The patient was advised of the nature of the diagnosed disorder , the treatment options and the  risks for general health and wellness arising from not treating the condition.   I spent more than 50 minutes of face to face time with the patient.  Greater than 50% of time was spent in counseling and coordination of care. We have discussed the diagnosis and differential and I answered the patient's questions.    Plan:  Treatment plan and additional workup :  I like to further look at the joint pain, as a cause of RLS. PLMs, ferritin, TIBC, hidden anemia. She should keep up Vit D and calcium. Tums.  I will order an attended sleep study - to look at PLMs and other arousals. Will need to rule out apnea, too. Marland Kitchen    Larey Seat, MD 6/96/7893, 81:01 AM  Certified in Neurology by ABPN Certified in [place] by [facility] [address] [place], [place] [address]

## 2018-04-01 ENCOUNTER — Telehealth: Payer: Self-pay | Admitting: Neurology

## 2018-04-01 LAB — IRON AND TIBC
Iron Saturation: 28 % (ref 15–55)
Iron: 76 ug/dL (ref 27–139)
Total Iron Binding Capacity: 268 ug/dL (ref 250–450)
UIBC: 192 ug/dL (ref 118–369)

## 2018-04-01 LAB — RHEUMATOID FACTOR: Rheumatoid fact SerPl-aCnc: 10.2 IU/mL (ref 0.0–13.9)

## 2018-04-01 LAB — FERRITIN: Ferritin: 134 ng/mL (ref 15–150)

## 2018-04-01 NOTE — Telephone Encounter (Signed)
-----   Message from Larey Seat, MD sent at 03/31/2018  4:30 PM EST ----- normlal ferritin level, not likely that low iron is present. RF pending.

## 2018-04-01 NOTE — Telephone Encounter (Signed)
Called the patient and there is no concerns with lab work causing RLS. Advised the patient that all of her labs were good and we will get her scheduled for sleep study once we get insurance approval. Instructed the patient to call with any questions she may have.

## 2018-04-02 ENCOUNTER — Telehealth: Payer: Self-pay

## 2018-04-02 ENCOUNTER — Other Ambulatory Visit: Payer: Self-pay | Admitting: Neurology

## 2018-04-02 DIAGNOSIS — G4701 Insomnia due to medical condition: Secondary | ICD-10-CM

## 2018-04-02 DIAGNOSIS — G2581 Restless legs syndrome: Secondary | ICD-10-CM

## 2018-04-02 DIAGNOSIS — G4761 Periodic limb movement disorder: Secondary | ICD-10-CM

## 2018-04-02 DIAGNOSIS — R0683 Snoring: Secondary | ICD-10-CM

## 2018-04-02 NOTE — Telephone Encounter (Signed)
Order for HST placed.

## 2018-04-02 NOTE — Telephone Encounter (Signed)
[employer] denied in lab sleep study even with restless legs diagnosis. Need HST order

## 2018-04-07 ENCOUNTER — Telehealth: Payer: Self-pay | Admitting: Family Medicine

## 2018-04-07 DIAGNOSIS — G2581 Restless legs syndrome: Secondary | ICD-10-CM

## 2018-04-07 MED ORDER — ROPINIROLE HCL 0.5 MG PO TABS: ORAL_TABLET | ORAL | 2 refills | Status: DC

## 2018-04-07 NOTE — Telephone Encounter (Signed)
Copied from [place] [id number]. Topic: Quick Communication - Rx Refill/Question >> Apr 07, 2018  9:27 AM Andria Frames L wrote: Medication: rOPINIRole (REQUIP) 0.5 MG tablet [[id number]] /Ricky calling from [facility] called and stated that she would need to speak with someone regarding directions. Please advise FBP#[id number]

## 2018-04-07 NOTE — Telephone Encounter (Signed)
Called and clarified rx

## 2018-04-07 NOTE — Telephone Encounter (Signed)
Copied from [id number]. Topic: Quick Communication - Rx Refill/Question >> Apr 07, 2018  9:27 AM Andria Frames L wrote: Medication: rOPINIRole (REQUIP) 0.5 MG tablet [[id number]] /Ricky calling [facility] called and stated that she would need to speak with someone regarding directions. Please advise QUC#[id number]

## 2018-04-29 ENCOUNTER — Telehealth: Payer: Self-pay

## 2018-04-29 NOTE — Telephone Encounter (Signed)
We have attempted to call the patient two times to schedule sleep study.  Patient has been unavailable at the phone numbers we have on file and has not returned our calls.  At this point we will send a letter asking patient to please contact the sleep lab to schedule their sleep study.  If patient calls back we will schedule them for their sleep study. 

## 2018-05-01 ENCOUNTER — Other Ambulatory Visit: Payer: Self-pay | Admitting: *Deleted

## 2018-05-01 DIAGNOSIS — G2581 Restless legs syndrome: Secondary | ICD-10-CM

## 2018-05-01 MED ORDER — ROPINIROLE HCL 0.5 MG PO TABS: ORAL_TABLET | ORAL | 1 refills | Status: DC

## 2018-05-13 ENCOUNTER — Ambulatory Visit (INDEPENDENT_AMBULATORY_CARE_PROVIDER_SITE_OTHER): Payer: PPO | Admitting: Neurology

## 2018-05-13 DIAGNOSIS — G4701 Insomnia due to medical condition: Secondary | ICD-10-CM

## 2018-05-13 DIAGNOSIS — G4733 Obstructive sleep apnea (adult) (pediatric): Secondary | ICD-10-CM | POA: Diagnosis not present

## 2018-05-13 DIAGNOSIS — G4761 Periodic limb movement disorder: Secondary | ICD-10-CM

## 2018-05-13 DIAGNOSIS — R0683 Snoring: Secondary | ICD-10-CM

## 2018-05-13 DIAGNOSIS — G2581 Restless legs syndrome: Secondary | ICD-10-CM

## 2018-05-16 DIAGNOSIS — G4733 Obstructive sleep apnea (adult) (pediatric): Secondary | ICD-10-CM | POA: Insufficient documentation

## 2018-05-16 DIAGNOSIS — R0683 Snoring: Secondary | ICD-10-CM | POA: Insufficient documentation

## 2018-05-16 NOTE — Procedures (Signed)
[place], [place] [address] NAME:  Stephanie [name]                                                                    DOB: April 22, 1950 MEDICAL RECORD No: [id number]                                                     DOS: 05/13/2018 REFERRING PHYSICIAN: Garnet Koyanagi - Cheri Rous, DO STUDY PERFORMED: [facility] on [employer] HISTORY: [name] is a 68 y.o. female patient and was seen on 03-30-2018 in a referral from Dr. Carollee Herter for sleep apnea, RLS. She started on Requip for RLS treatment, kept a low dose: " I metabolize drugs very slowly" - Requip takes one hour to become noticeable and controls symptoms of RLS enough to allow her to go to the movies, theater and /or watch a TV show and to sit still. She also believes she has ADD.  Her bedtime is 10 PM, she has an established routine. She takes Requip already 60 minutes before TV time at 7 PM. The bedroom is cool, quiet and dark, and she shares the bed with husband who likes the bedroom warmer. She uses a weighted comforter, her duvet is heavy. Her sleep is less restorative, always wakes at 2.30 AM and sometimes unable to go back to sleep. RLS symptoms will set in again between 3 and 4 AM.  Rises at 5.30 AM, but would love to stay in bed - the RLS makes it difficult, she also feels hot. Less than 6 hours on average of sleep- 4 hours are "good sleep" and sometimes she even dreams. The Epworth Sleepiness Score was endorsed at 3/ 24 points, Fatigue severity score at 24/ 63 points, with a BMI of 27.0 kg/m2.   STUDY RESULTS:  Total Recording Time: 8 h 25 mins; Calculated Sleep Time: 7 h 31 mins, Total Apnea/Hypopnea Index (AHI): 11.5 /h; RDI: 14.8/h; REM AHI: 16.9/h, Average Oxygen Saturation: 95%; Lowest Oxygen Desaturation:  89%.  Average Heart Rate: 68 bpm (between 45 and 91 bpm). IMPRESSION:  Mild Obstructive Sleep apnea at AHI of 11/h, moderate to loud snoring is suggested by the RDI of 14.8/h and apnea is REM sleep accentuated.  RECOMMENDATION: Since there is no  hypoxemia present, a dental device can be used to reduce the AHI by 50% or more (in this case to a "normal" AHI around 5/h.). CPAP is another option, an autotitration device can be ordered if she prefers this treatment. I am expecting her input.  HSTs cannot evaluate arousals from RLS / PLMs and differentiate poorly between sleep stages. For a patient on only 0.5 mg Requip it may be best to split the dose in two- one to be taken at 7 PM and one at 10 PM , allowing for a longer therapeutic coverage.   I certify that I have reviewed the raw data recording prior to the issuance of this report in accordance with the standards of the American Academy of Sleep Medicine (AASM). Larey Seat, M.D.   05-16-2018   Medical Director of [facility] at  [facility], accredited by the AASM. Diplomat of the ABPN and ABSM. Diplomat, ABPN (Neurology and Sleep)

## 2018-05-18 ENCOUNTER — Telehealth: Payer: Self-pay | Admitting: Neurology

## 2018-05-18 NOTE — Telephone Encounter (Signed)
-----   Message from Larey Seat, MD sent at 05/16/2018  1:17 PM EST ----- Since there is no hypoxemia present, a dental device can be used to reduce the AHI by 50% or more (in this case to a "normal" AHI around 5/h.).  CPAP therapy  is another option, an autotitration device can be ordered if she prefers this treatment.  I am expecting her input.   HSTs cannot evaluate arousals from RLS / PLMs and differentiate poorly between sleep stages. For a patient on only 0.5 mg Requip it may be best to split the dose in two- one to be taken at 7 PM and one at 10 PM , allowing for a longer therapeutic coverage.    Gerline Legacy, RN : Please ask patient if she wants a dental referral or try a CPAP device.

## 2018-05-18 NOTE — Telephone Encounter (Signed)
Called patient to discuss sleep study results. No answer at this time. LVM for the patient to call back.   

## 2018-05-19 ENCOUNTER — Encounter: Payer: Self-pay | Admitting: Neurology

## 2018-07-31 ENCOUNTER — Encounter: Payer: Self-pay | Admitting: Internal Medicine

## 2018-12-31 ENCOUNTER — Other Ambulatory Visit: Payer: Self-pay | Admitting: Family Medicine

## 2018-12-31 DIAGNOSIS — G2581 Restless legs syndrome: Secondary | ICD-10-CM

## 2018-12-31 MED ORDER — ROPINIROLE HCL 0.5 MG PO TABS: ORAL_TABLET | ORAL | 0 refills | Status: DC

## 2018-12-31 NOTE — Telephone Encounter (Signed)
Medication Refill - Medication: rOPINIRole (REQUIP) 0.5 MG tablet    Has the patient contacted their pharmacy? Yes.   (Agent: If no, request that the patient contact the pharmacy for the refill.) (Agent: If yes, when and what did the pharmacy advise?) No response from PCP.  Preferred Pharmacy (with phone number or street name): [facility] [address], [place] [phone] (Phone) [phone] (Fax)     Agent: Please be advised that RX refills may take up to 3 business days. We ask that you follow-up with your pharmacy.

## 2019-03-12 HISTORY — PX: CATARACT EXTRACTION: SUR2

## 2019-04-08 ENCOUNTER — Ambulatory Visit: Payer: PPO

## 2019-04-09 ENCOUNTER — Ambulatory Visit: Payer: PPO

## 2019-04-12 ENCOUNTER — Other Ambulatory Visit: Payer: Self-pay | Admitting: Family Medicine

## 2019-04-12 DIAGNOSIS — G2581 Restless legs syndrome: Secondary | ICD-10-CM

## 2019-04-12 MED ORDER — ROPINIROLE HCL 0.5 MG PO TABS: ORAL_TABLET | ORAL | 0 refills | Status: DC

## 2019-04-16 ENCOUNTER — Ambulatory Visit: Payer: PPO | Attending: Internal Medicine

## 2019-04-16 DIAGNOSIS — Z23 Encounter for immunization: Secondary | ICD-10-CM

## 2019-04-16 NOTE — Progress Notes (Signed)
   Covid-19 Vaccination Clinic  Name:  Stephanie [name]    MRN: [id number] DOB: 1950-05-17  04/16/2019  Ms. Stephanie [name] was observed post Covid-19 immunization for 30 minutes without incidence. She was provided with Vaccine Information Sheet and instruction to access the V-Safe system.   Ms. Stephanie [name] was instructed to call 911 with any severe reactions post vaccine: Marland Kitchen Difficulty breathing  . Swelling of your face and throat  . A fast heartbeat  . A bad rash all over your body  . Dizziness and weakness    Immunizations Administered    Name Date Dose VIS Date Route   Pfizer COVID-19 Vaccine 04/16/2019  4:45 PM 0.3 mL 02/19/2019 Intramuscular   Manufacturer: [place]   Lot: [id number]   [place]: [id number]

## 2019-04-20 ENCOUNTER — Ambulatory Visit: Payer: PPO

## 2019-05-12 ENCOUNTER — Ambulatory Visit: Payer: PPO | Attending: Internal Medicine

## 2019-05-12 DIAGNOSIS — Z23 Encounter for immunization: Secondary | ICD-10-CM | POA: Insufficient documentation

## 2019-05-12 NOTE — Progress Notes (Signed)
   Covid-19 Vaccination Clinic  Name:  Stephanie [name]    MRN: [id number] DOB: 1950-10-29  05/12/2019  Ms. Homesley was observed post Covid-19 immunization for 30 minutes based on pre-vaccination screening without incident. She was provided with Vaccine Information Sheet and instruction to access the V-Safe system.   Ms. Leonard was instructed to call 911 with any severe reactions post vaccine: Marland Kitchen Difficulty breathing  . Swelling of face and throat  . A fast heartbeat  . A bad rash all over body  . Dizziness and weakness   Immunizations Administered    Name Date Dose VIS Date Route   Pfizer COVID-19 Vaccine 05/12/2019  8:20 AM 0.3 mL 02/19/2019 Intramuscular   Manufacturer: [place]   Lot: [id number]   [place]: [id number]

## 2019-05-23 ENCOUNTER — Other Ambulatory Visit: Payer: Self-pay | Admitting: Family Medicine

## 2019-05-23 DIAGNOSIS — G2581 Restless legs syndrome: Secondary | ICD-10-CM

## 2019-05-24 ENCOUNTER — Other Ambulatory Visit: Payer: Self-pay | Admitting: Family Medicine

## 2019-05-24 DIAGNOSIS — G2581 Restless legs syndrome: Secondary | ICD-10-CM

## 2019-05-24 MED ORDER — ROPINIROLE HCL 0.5 MG PO TABS: ORAL_TABLET | ORAL | 0 refills | Status: DC

## 2019-06-21 ENCOUNTER — Other Ambulatory Visit: Payer: Self-pay

## 2019-06-21 ENCOUNTER — Ambulatory Visit (INDEPENDENT_AMBULATORY_CARE_PROVIDER_SITE_OTHER): Payer: PPO | Admitting: Family Medicine

## 2019-06-21 ENCOUNTER — Encounter: Payer: Self-pay | Admitting: Family Medicine

## 2019-06-21 VITALS — BP 120/70 | HR 91 | Temp 97.3°F | Resp 18 | Ht 63.0 in | Wt 159.4 lb

## 2019-06-21 DIAGNOSIS — R5383 Other fatigue: Secondary | ICD-10-CM

## 2019-06-21 DIAGNOSIS — G4700 Insomnia, unspecified: Secondary | ICD-10-CM | POA: Diagnosis not present

## 2019-06-21 DIAGNOSIS — E559 Vitamin D deficiency, unspecified: Secondary | ICD-10-CM

## 2019-06-21 DIAGNOSIS — G2581 Restless legs syndrome: Secondary | ICD-10-CM

## 2019-06-21 DIAGNOSIS — M1991 Primary osteoarthritis, unspecified site: Secondary | ICD-10-CM | POA: Diagnosis not present

## 2019-06-21 DIAGNOSIS — G4733 Obstructive sleep apnea (adult) (pediatric): Secondary | ICD-10-CM | POA: Diagnosis not present

## 2019-06-21 DIAGNOSIS — E785 Hyperlipidemia, unspecified: Secondary | ICD-10-CM | POA: Diagnosis not present

## 2019-06-21 MED ORDER — ROPINIROLE HCL 0.5 MG PO TABS: ORAL_TABLET | ORAL | 3 refills | Status: DC

## 2019-06-21 NOTE — Patient Instructions (Signed)

## 2019-06-21 NOTE — Assessment & Plan Note (Addendum)
Stable Refill meds  Consider splittin dose to make it last longer

## 2019-06-21 NOTE — Assessment & Plan Note (Signed)
Pt refusing cpap and oral appliance

## 2019-06-21 NOTE — Progress Notes (Signed)
Patient ID: Stephanie [name], female    DOB: 01/16/1951  Age: 69 y.o. MRN: [id number]    Subjective:  Subjective  HPI Stephanie [name] presents for f/u rls.  And insomnia She never went back to neuro ---  A dental appliance or cpap was recommended.  [t does not want either  She also c/o arthritis in her hands but knows it is osteoarthritis no rheumatoid   Review of Systems  Constitutional: Negative for appetite change, diaphoresis, fatigue and unexpected weight change.  Eyes: Negative for pain, redness and visual disturbance.  Respiratory: Negative for cough, chest tightness, shortness of breath and wheezing.   Cardiovascular: Negative for chest pain, palpitations and leg swelling.  Endocrine: Negative for cold intolerance, heat intolerance, polydipsia, polyphagia and polyuria.  Genitourinary: Negative for difficulty urinating, dysuria and frequency.  Musculoskeletal: Positive for arthralgias.  Neurological: Negative for dizziness, light-headedness, numbness and headaches.  Psychiatric/Behavioral: Positive for sleep disturbance.    History Past Medical History:  Diagnosis Date  . Arthritis   . Cataract    beginning signs of cataract  . Complication of anesthesia    Per pt, hard to wake up past c-section/ 1 time.  . DDD (degenerative disc disease)    in feet and hands  . GERD (gastroesophageal reflux disease)   . Hx of colonic polyps 08/20/2016  . Hyperlipidemia   . Lactose intolerance     She has a past surgical history that includes Total abdominal hysterectomy w/ bilateral salpingoophorectomy; Cesarean section; and Colonoscopy.   Her family history includes Alzheimer's disease in her mother; Arthritis in her brother, father, mother, and sister; Cystic fibrosis in an other family member; Dementia in her mother; Hypertension in her father and mother; Liver disease in her father, paternal aunt, paternal aunt, paternal aunt, and sister.She reports that she has never smoked. She has  never used smokeless tobacco. She reports current alcohol use of about 7.0 standard drinks of alcohol per week. She reports that she does not use drugs.  Current Outpatient Medications on File Prior to Visit  Medication Sig Dispense Refill  . Vitamin D, Ergocalciferol, (DRISDOL) 1.25 MG (50000 UT) CAPS capsule Take 1 capsule (50,000 Units total) by mouth every 7 (seven) days. 8 capsule 0   Current Facility-Administered Medications on File Prior to Visit  Medication Dose Route Frequency Provider Last Rate Last Admin  . 0.9 %  sodium chloride infusion  500 mL Intravenous Continuous Gatha Mayer, MD         Objective:  Objective  Physical Exam Vitals and nursing note reviewed.  Constitutional:      Appearance: She is well-developed.  HENT:     Head: Normocephalic and atraumatic.  Eyes:     Conjunctiva/sclera: Conjunctivae normal.  Neck:     Thyroid: No thyromegaly.     Vascular: No carotid bruit or JVD.  Cardiovascular:     Rate and Rhythm: Normal rate and regular rhythm.     Heart sounds: Normal heart sounds. No murmur.  Pulmonary:     Effort: Pulmonary effort is normal. No respiratory distress.     Breath sounds: Normal breath sounds. No wheezing or rales.  Chest:     Chest wall: No tenderness.  Musculoskeletal:        General: Tenderness and deformity present.     Right hand: Swelling and deformity present.     Left hand: Swelling and deformity present.     Cervical back: Normal range of motion and neck supple.  Comments: C/w with OA in hands   Neurological:     Mental Status: She is alert and oriented to person, place, and time.    BP 120/70 (BP Location: Right Arm, Patient Position: Sitting, Cuff Size: Normal)   Pulse 91   Temp (!) 97.3 F (36.3 C) (Temporal)   Resp 18   Ht 5\' 3"  (1.6 m)   Wt 159 lb 6.4 oz (72.3 kg)   SpO2 95%   BMI 28.24 kg/m  Wt Readings from Last 3 Encounters:  06/21/19 159 lb 6.4 oz (72.3 kg)  03/30/18 152 lb (68.9 kg)  01/26/18 153  lb (69.4 kg)     Lab Results  Component Value Date   WBC 4.7 01/30/2018   HGB 13.7 01/30/2018   HCT 40.9 01/30/2018   PLT 222.0 01/30/2018   GLUCOSE 93 01/30/2018   CHOL 237 (H) 05/06/2017   TRIG 67.0 05/06/2017   HDL 72.50 05/06/2017   LDLDIRECT 103.2 10/15/2011   LDLCALC 151 (H) 05/06/2017   ALT 12 01/30/2018   AST 17 01/30/2018   NA 141 01/30/2018   K 5.0 01/30/2018   CL 106 01/30/2018   CREATININE 0.85 01/30/2018   BUN 13 01/30/2018   CO2 27 01/30/2018   TSH 3.15 04/13/2015    DG Bone Density  Result Date: 05/06/2017 EXAM: DUAL X-RAY ABSORPTIOMETRY (DXA) FOR BONE MINERAL DENSITY IMPRESSION: Referring Physician:  Mackie Pai PATIENT: Name: Stephanie [name], Stephanie [name] Patient ID: [id number] Birth Date: [birth year] Height: 63.5 in. Sex: Female Measured: 05/06/2017 Weight: 151.4 lbs. Indications: Caucasian, Estrogen Deficiency, Hysterectomy, Oophorectomy ( Bilateral), Post Menopausal Fractures: Foot Treatments: HRT ASSESSMENT: The BMD measured at AP Spine L1-L4 is 0.958 g/cm2 with a T-score of -1.9. This patient is considered osteopenic according to [place] [facility]) criteria. Site Region Measured Date Measured Age WHO YA BMD Classification T-score AP Spine L1-L4 05/06/2017 65.1 Low Bone Mass -1.9 0.958 g/cm2 DualFemur Neck Right 05/06/2017 65.1 years Low Bone Mass -1.5 0.830 g/cm2 World Health Organization [facility]) criteria for post-menopausal, Caucasian Women: Normal        T-score at or above -1 SD Low Bone Mass T-score between -1 and -2.5 SD Osteoporosis  T-score at or below -2.5 SD RECOMMENDATION: [place] recommends that FDA-approved medical therapies be considered in postmenopausal women and men age 2 or older with a: 1. Hip or vertebral (clinical or morphometric) fracture. 2. T-score of < -2.5 at the spine or hip. 3. Ten-year fracture probability by FRAX of 3% or greater for hip fracture or 20% or greater for major osteoporotic fracture. All treatment  decisions require clinical judgment and consideration of individual patient factors, including patient preferences, co-morbidities, previous drug use, risk factors not captured in the FRAX model (e.g. falls, vitamin D deficiency, increased bone turnover, interval significant decline in bone density) and possible under - or over-estimation of fracture risk by FRAX. All patients should ensure an adequate intake of dietary calcium (1200 mg/d) and vitamin D (800 IU daily) unless contraindicated. FOLLOW-UP: People with diagnosed cases of osteoporosis or at high risk for fracture should have regular bone mineral density tests. For patients eligible for Medicare, routine testing is allowed once every 2 years. The testing frequency can be increased to one year for patients who have rapidly progressing disease, those who are receiving or discontinuing medical therapy to restore bone mass, or have additional risk factors. I have reviewed this report and agree with the above findings. [facility] Patient: Stephanie [name] Referring Physician: Mackie Pai  Birth Date: Dec 07, 1950 [age]       62.3 years Patient ID: [id number] Height: 63.5 in. Weight: 151.4 lbs. Measured: 05/06/2017 9:36:48 AM (16 SP 2) Sex: Female Ethnicity: White Analyzed: 05/06/2017 9:39:06 AM (16 SP 2) FRAX* 10-year Probability of Fracture Based on femoral neck BMD: DualFemur (Right) Major Osteoporotic Fracture: 8.9% Hip Fracture:                1.0% Population:                  [place] (Caucasian) Risk Factors:                None *FRAX is a Materials engineer of the [employer] of [employer] for Metabolic Bone Disease, a World Pharmacologist (WHO) [facility]. ASSESSMENT: The probability of a major osteoporotic fracture is 8.9% within the next ten years. The probability of a hip fracture is 1.0% within the next ten years. Electronically Signed   By: Lowella Grip III M.D.   On: 05/06/2017 10:55   MM SCREENING BREAST TOMO  BILATERAL  Result Date: 05/06/2017 CLINICAL DATA:  Screening. EXAM: DIGITAL SCREENING BILATERAL MAMMOGRAM WITH TOMO AND CAD COMPARISON:  Previous exam(s). ACR Breast Density Category b: There are scattered areas of fibroglandular density. FINDINGS: There are no findings suspicious for malignancy. Images were processed with CAD. IMPRESSION: No mammographic evidence of malignancy. A result letter of this screening mammogram will be mailed directly to the patient. RECOMMENDATION: Screening mammogram in one year. (Code:[id number]-B-01Y) BI-RADS CATEGORY  1: Negative. Electronically Signed   By: Dorise Bullion III M.D   On: 05/06/2017 16:12     Assessment & Plan:  Plan  I have discontinued Stephanie [name] "Pam"'s VITAMIN D PO, Calcium Carbonate-Vit D-Min (CALCIUM 1200 PO), Melatonin, DOXYLAMINE SUCCINATE PO, and IBUPROFEN PO. I have also changed her rOPINIRole. Additionally, I am having her maintain her Vitamin D (Ergocalciferol). We will continue to administer sodium chloride.  Meds ordered this encounter  Medications  . rOPINIRole (REQUIP) 0.5 MG tablet    Sig: Take 2 tablets at bedtime.  May increase to 3 tablets at bedtime if needed.    Dispense:  270 tablet    Refill:  3    Problem List Items Addressed This Visit      Unprioritized   Insomnia   OSA (obstructive sleep apnea)    Pt refusing cpap and oral appliance       Osteoarthritis    Tylenol arthritis voltaren gel       Restless legs    Stable Refill meds  Consider splittin dose to make it last longer        Other Visit Diagnoses    Other fatigue    -  Primary   Relevant Orders   TSH   CBC with Differential/Platelet   Comprehensive metabolic panel   Vitamin [id number]   Vitamin D (25 hydroxy)   RLS (restless legs syndrome)       Relevant Medications   rOPINIRole (REQUIP) 0.5 MG tablet   Hyperlipidemia, unspecified hyperlipidemia type       Relevant Orders   Lipid panel   Comprehensive metabolic panel   Vitamin D deficiency        Relevant Orders   Vitamin D (25 hydroxy)      Follow-up: Return in about 6 months (around 12/21/2019), or if symptoms worsen or fail to improve, for annual exam, fasting.  [name], DO

## 2019-06-21 NOTE — Assessment & Plan Note (Signed)
Tylenol arthritis voltaren gel

## 2019-06-22 LAB — COMPREHENSIVE METABOLIC PANEL
ALT: 14 U/L (ref 0–35)
AST: 20 U/L (ref 0–37)
Albumin: 4.2 g/dL (ref 3.5–5.2)
Alkaline Phosphatase: 72 U/L (ref 39–117)
BUN: 17 mg/dL (ref 6–23)
CO2: 24 mEq/L (ref 19–32)
Calcium: 9.3 mg/dL (ref 8.4–10.5)
Chloride: 105 mEq/L (ref 96–112)
Creatinine, Ser: 0.76 mg/dL (ref 0.40–1.20)
GFR: 75.85 mL/min (ref >60.00)
Glucose, Bld: 95 mg/dL (ref 70–99)
Potassium: 3.8 mEq/L (ref 3.5–5.1)
Sodium: 138 mEq/L (ref 135–145)
Total Bilirubin: 0.4 mg/dL (ref 0.2–1.2)
Total Protein: 6.8 g/dL (ref 6.0–8.3)

## 2019-06-22 LAB — CBC WITH DIFFERENTIAL/PLATELET
Basophils Absolute: 0 10*3/uL (ref 0.0–0.1)
Basophils Relative: 0.6 % (ref 0.0–3.0)
Eosinophils Absolute: 0.1 10*3/uL (ref 0.0–0.7)
Eosinophils Relative: 1.8 % (ref 0.0–5.0)
HCT: 39.5 % (ref 36.0–46.0)
Hemoglobin: 13.2 g/dL (ref 12.0–15.0)
Lymphocytes Relative: 27.6 % (ref 12.0–46.0)
Lymphs Abs: 1.9 10*3/uL (ref 0.7–4.0)
MCHC: 33.4 g/dL (ref 30.0–36.0)
MCV: 93.2 fl (ref 78.0–100.0)
Monocytes Absolute: 0.6 10*3/uL (ref 0.1–1.0)
Monocytes Relative: 7.9 % (ref 3.0–12.0)
Neutro Abs: 4.4 10*3/uL (ref 1.4–7.7)
Neutrophils Relative %: 62.1 % (ref 43.0–77.0)
Platelets: 217 10*3/uL (ref 150.0–400.0)
RBC: 4.24 Mil/uL (ref 3.87–5.11)
RDW: 13 % (ref 11.5–15.5)
WBC: 7 10*3/uL (ref 4.0–10.5)

## 2019-06-22 LAB — LIPID PANEL
Cholesterol: 241 mg/dL — ABNORMAL HIGH (ref 0–200)
HDL: 81.9 mg/dL (ref >39.00)
LDL Cholesterol: 137 mg/dL — ABNORMAL HIGH (ref 0–99)
NonHDL: 158.61
Total CHOL/HDL Ratio: 3
Triglycerides: 109 mg/dL (ref 0.0–149.0)
VLDL: 21.8 mg/dL (ref 0.0–40.0)

## 2019-06-22 LAB — VITAMIN D 25 HYDROXY (VIT D DEFICIENCY, FRACTURES): VITD: 22.8 ng/mL — ABNORMAL LOW (ref 30.00–100.00)

## 2019-06-22 LAB — VITAMIN B12: Vitamin B-12: 199 pg/mL — ABNORMAL LOW (ref 211–911)

## 2019-06-22 LAB — TSH: TSH: 2.7 u[IU]/mL (ref 0.35–4.50)

## 2019-06-23 ENCOUNTER — Other Ambulatory Visit: Payer: Self-pay | Admitting: Family Medicine

## 2019-06-23 DIAGNOSIS — E559 Vitamin D deficiency, unspecified: Secondary | ICD-10-CM

## 2019-06-23 DIAGNOSIS — E538 Deficiency of other specified B group vitamins: Secondary | ICD-10-CM

## 2019-06-23 MED ORDER — VITAMIN D (ERGOCALCIFEROL) 1.25 MG (50000 UNIT) PO CAPS: 50000.0000 [IU] | ORAL_CAPSULE | ORAL | 1 refills | Status: DC

## 2019-07-01 ENCOUNTER — Other Ambulatory Visit: Payer: Self-pay

## 2019-07-01 ENCOUNTER — Ambulatory Visit (INDEPENDENT_AMBULATORY_CARE_PROVIDER_SITE_OTHER): Payer: PPO

## 2019-07-01 DIAGNOSIS — E538 Deficiency of other specified B group vitamins: Secondary | ICD-10-CM

## 2019-07-01 MED ORDER — CYANOCOBALAMIN 1000 MCG/ML IJ SOLN
1000.0000 ug | Freq: Once | INTRAMUSCULAR | Status: AC
  Administered 2019-07-01: 1000 ug via INTRAMUSCULAR

## 2019-07-01 NOTE — Progress Notes (Signed)
Reviewed  Noelie Renfrow R Lowne Chase, DO  

## 2019-07-01 NOTE — Progress Notes (Signed)
  Stephanie [name] is a 69 y.o. female presents to the office today for cyanocobalamin 1000mg  injections, per physician's orders to do once a week for 4 weeks then monthly. Original order on lab results 06-23-19. B12 was administered IM  (location) today. Patient tolerated injection.  Patient next injection due:in one week and scheduled for  07-08-19  [name]

## 2019-07-08 ENCOUNTER — Ambulatory Visit (INDEPENDENT_AMBULATORY_CARE_PROVIDER_SITE_OTHER): Payer: PPO

## 2019-07-08 ENCOUNTER — Other Ambulatory Visit: Payer: Self-pay

## 2019-07-08 DIAGNOSIS — E538 Deficiency of other specified B group vitamins: Secondary | ICD-10-CM | POA: Diagnosis not present

## 2019-07-08 MED ORDER — CYANOCOBALAMIN 1000 MCG/ML IJ SOLN
1000.0000 ug | INTRAMUSCULAR | Status: DC
  Administered 2019-07-08: 1000 ug via INTRAMUSCULAR

## 2019-07-08 NOTE — Progress Notes (Signed)
B12 was administered IM  (right deltoid) today. Patient tolerated injection

## 2019-07-12 DIAGNOSIS — H2513 Age-related nuclear cataract, bilateral: Secondary | ICD-10-CM | POA: Diagnosis not present

## 2019-07-12 DIAGNOSIS — H40013 Open angle with borderline findings, low risk, bilateral: Secondary | ICD-10-CM | POA: Diagnosis not present

## 2019-07-12 DIAGNOSIS — H25013 Cortical age-related cataract, bilateral: Secondary | ICD-10-CM | POA: Diagnosis not present

## 2019-07-12 DIAGNOSIS — H53002 Unspecified amblyopia, left eye: Secondary | ICD-10-CM | POA: Diagnosis not present

## 2019-07-15 ENCOUNTER — Other Ambulatory Visit: Payer: Self-pay

## 2019-07-15 ENCOUNTER — Ambulatory Visit (INDEPENDENT_AMBULATORY_CARE_PROVIDER_SITE_OTHER): Payer: PPO

## 2019-07-15 DIAGNOSIS — E538 Deficiency of other specified B group vitamins: Secondary | ICD-10-CM | POA: Diagnosis not present

## 2019-07-15 NOTE — Progress Notes (Addendum)
B12was administered IM(left deltoid) today. Patient tolerated injection   Nursing note reviewed. Agree with documention and plan.   Reviewed [name], DO

## 2019-07-22 ENCOUNTER — Ambulatory Visit (INDEPENDENT_AMBULATORY_CARE_PROVIDER_SITE_OTHER): Payer: PPO

## 2019-07-22 ENCOUNTER — Other Ambulatory Visit: Payer: Self-pay

## 2019-07-22 DIAGNOSIS — E538 Deficiency of other specified B group vitamins: Secondary | ICD-10-CM | POA: Diagnosis not present

## 2019-07-22 MED ORDER — CYANOCOBALAMIN 1000 MCG/ML IJ SOLN
1000.0000 ug | INTRAMUSCULAR | Status: DC
  Administered 2019-07-15: 1000 ug via INTRAMUSCULAR

## 2019-07-22 MED ORDER — CYANOCOBALAMIN 1000 MCG/ML IJ SOLN
1000.0000 ug | Freq: Once | INTRAMUSCULAR | Status: AC
  Administered 2019-07-22: 1000 ug via INTRAMUSCULAR

## 2019-07-22 NOTE — Progress Notes (Addendum)
Stephanie [name] is a 69 y.o. female presents to the office today for Cyanocobalamin 1,079mcg/ml injections, per physician's orders. Original order on lab results from 06-23-19. B12 was administered im right deltoid  today. Patient tolerated injection. Patient due for follow up provider appt. On 01-04-2020.  -Patient next injection due in one month. Appt made for 08-26-2019,   [place], DO

## 2019-07-22 NOTE — Addendum Note (Signed)
Addended by: Sanda Linger on: 07/22/2019 10:06 AM   Modules accepted: Orders

## 2019-08-18 DIAGNOSIS — S92504A Nondisplaced unspecified fracture of right lesser toe(s), initial encounter for closed fracture: Secondary | ICD-10-CM | POA: Diagnosis not present

## 2019-08-18 DIAGNOSIS — M25552 Pain in left hip: Secondary | ICD-10-CM | POA: Diagnosis not present

## 2019-08-18 DIAGNOSIS — M545 Low back pain: Secondary | ICD-10-CM | POA: Diagnosis not present

## 2019-08-26 ENCOUNTER — Other Ambulatory Visit: Payer: Self-pay

## 2019-08-26 ENCOUNTER — Ambulatory Visit (INDEPENDENT_AMBULATORY_CARE_PROVIDER_SITE_OTHER): Payer: PPO

## 2019-08-26 DIAGNOSIS — E538 Deficiency of other specified B group vitamins: Secondary | ICD-10-CM

## 2019-08-26 MED ORDER — CYANOCOBALAMIN 1000 MCG/ML IJ SOLN
1000.0000 ug | Freq: Once | INTRAMUSCULAR | Status: AC
  Administered 2019-08-26: 1000 ug via INTRAMUSCULAR

## 2019-08-26 NOTE — Progress Notes (Signed)
Stephanie [name] is a 69 y.o. female presents to the office today for cyanocobalamin  injections, per physician's orders. Original order: on lab results from 06/23/19. B12 was administered im today. Patient tolerated injection.  Patient next injection due:, appt made for July 20th.   [name]

## 2019-09-02 DIAGNOSIS — H2512 Age-related nuclear cataract, left eye: Secondary | ICD-10-CM | POA: Diagnosis not present

## 2019-09-02 DIAGNOSIS — H52213 Irregular astigmatism, bilateral: Secondary | ICD-10-CM | POA: Diagnosis not present

## 2019-09-02 DIAGNOSIS — H2513 Age-related nuclear cataract, bilateral: Secondary | ICD-10-CM | POA: Diagnosis not present

## 2019-09-02 DIAGNOSIS — H25013 Cortical age-related cataract, bilateral: Secondary | ICD-10-CM | POA: Diagnosis not present

## 2019-09-02 DIAGNOSIS — H40013 Open angle with borderline findings, low risk, bilateral: Secondary | ICD-10-CM | POA: Diagnosis not present

## 2019-09-02 DIAGNOSIS — H40033 Anatomical narrow angle, bilateral: Secondary | ICD-10-CM | POA: Diagnosis not present

## 2019-09-22 DIAGNOSIS — S92504D Nondisplaced unspecified fracture of right lesser toe(s), subsequent encounter for fracture with routine healing: Secondary | ICD-10-CM | POA: Diagnosis not present

## 2019-09-28 ENCOUNTER — Ambulatory Visit: Payer: PPO

## 2019-10-12 DIAGNOSIS — H2512 Age-related nuclear cataract, left eye: Secondary | ICD-10-CM | POA: Diagnosis not present

## 2019-10-12 DIAGNOSIS — H25812 Combined forms of age-related cataract, left eye: Secondary | ICD-10-CM | POA: Diagnosis not present

## 2019-11-03 DIAGNOSIS — H2511 Age-related nuclear cataract, right eye: Secondary | ICD-10-CM | POA: Diagnosis not present

## 2019-11-03 DIAGNOSIS — H25011 Cortical age-related cataract, right eye: Secondary | ICD-10-CM | POA: Diagnosis not present

## 2019-11-16 DIAGNOSIS — H25011 Cortical age-related cataract, right eye: Secondary | ICD-10-CM | POA: Diagnosis not present

## 2019-11-16 DIAGNOSIS — H2511 Age-related nuclear cataract, right eye: Secondary | ICD-10-CM | POA: Diagnosis not present

## 2019-11-16 DIAGNOSIS — H25811 Combined forms of age-related cataract, right eye: Secondary | ICD-10-CM | POA: Diagnosis not present

## 2020-01-04 ENCOUNTER — Encounter: Payer: Self-pay | Admitting: Family Medicine

## 2020-01-04 ENCOUNTER — Ambulatory Visit (INDEPENDENT_AMBULATORY_CARE_PROVIDER_SITE_OTHER): Payer: PPO | Admitting: Family Medicine

## 2020-01-04 ENCOUNTER — Other Ambulatory Visit: Payer: Self-pay

## 2020-01-04 VITALS — BP 100/78 | HR 66 | Temp 97.8°F | Resp 18 | Ht 63.0 in | Wt 159.0 lb

## 2020-01-04 DIAGNOSIS — K635 Polyp of colon: Secondary | ICD-10-CM

## 2020-01-04 DIAGNOSIS — G2581 Restless legs syndrome: Secondary | ICD-10-CM | POA: Diagnosis not present

## 2020-01-04 DIAGNOSIS — Z0000 Encounter for general adult medical examination without abnormal findings: Secondary | ICD-10-CM

## 2020-01-04 DIAGNOSIS — E785 Hyperlipidemia, unspecified: Secondary | ICD-10-CM | POA: Diagnosis not present

## 2020-01-04 DIAGNOSIS — E739 Lactose intolerance, unspecified: Secondary | ICD-10-CM

## 2020-01-04 DIAGNOSIS — E2839 Other primary ovarian failure: Secondary | ICD-10-CM | POA: Diagnosis not present

## 2020-01-04 DIAGNOSIS — E559 Vitamin D deficiency, unspecified: Secondary | ICD-10-CM | POA: Diagnosis not present

## 2020-01-04 DIAGNOSIS — Z23 Encounter for immunization: Secondary | ICD-10-CM

## 2020-01-04 DIAGNOSIS — D2230 Melanocytic nevi of unspecified part of face: Secondary | ICD-10-CM | POA: Diagnosis not present

## 2020-01-04 NOTE — Patient Instructions (Signed)
Preventive Care 67 Years and Older, Female Preventive care refers to lifestyle choices and visits with your health care provider that can promote health and wellness. This includes:  A yearly physical exam. This is also called an annual well check.  Regular dental and eye exams.  Immunizations.  Screening for certain conditions.  Healthy lifestyle choices, such as diet and exercise. What can I expect for my preventive care visit? Physical exam Your health care provider will check:  Height and weight. These may be used to calculate body mass index (BMI), which is a measurement that tells if you are at a healthy weight.  Heart rate and blood pressure.  Your skin for abnormal spots. Counseling Your health care provider may ask you questions about:  Alcohol, tobacco, and drug use.  Emotional well-being.  Home and relationship well-being.  Sexual activity.  Eating habits.  History of falls.  Memory and ability to understand (cognition).  Work and work Statistician.  Pregnancy and menstrual history. What immunizations do I need?  Influenza (flu) vaccine  This is recommended every year. Tetanus, diphtheria, and pertussis (Tdap) vaccine  You may need a Td booster every 10 years. Varicella (chickenpox) vaccine  You may need this vaccine if you have not already been vaccinated. Zoster (shingles) vaccine  You may need this after age 67. Pneumococcal conjugate (PCV13) vaccine  One dose is recommended after age 67. Pneumococcal polysaccharide (PPSV23) vaccine  One dose is recommended after age 72. Measles, mumps, and rubella (MMR) vaccine  You may need at least one dose of MMR if you were born in 1957 or later. You may also need a second dose. Meningococcal conjugate (MenACWY) vaccine  You may need this if you have certain conditions. Hepatitis A vaccine  You may need this if you have certain conditions or if you travel or work in places where you may be exposed  to hepatitis A. Hepatitis B vaccine  You may need this if you have certain conditions or if you travel or work in places where you may be exposed to hepatitis B. Haemophilus influenzae type b (Hib) vaccine  You may need this if you have certain conditions. You may receive vaccines as individual doses or as more than one vaccine together in one shot (combination vaccines). Talk with your health care provider about the risks and benefits of combination vaccines. What tests do I need? Blood tests  Lipid and cholesterol levels. These may be checked every 5 years, or more frequently depending on your overall health.  Hepatitis C test.  Hepatitis B test. Screening  Lung cancer screening. You may have this screening every year starting at age 67 if you have a 30-pack-year history of smoking and currently smoke or have quit within the past 15 years.  Colorectal cancer screening. All adults should have this screening starting at age 67 and continuing until age 15. Your health care provider may recommend screening at age 23 if you are at increased risk. You will have tests every 1-10 years, depending on your results and the type of screening test.  Diabetes screening. This is done by checking your blood sugar (glucose) after you have not eaten for a while (fasting). You may have this done every 1-3 years.  Mammogram. This may be done every 1-2 years. Talk with your health care provider about how often you should have regular mammograms.  BRCA-related cancer screening. This may be done if you have a family history of breast, ovarian, tubal, or peritoneal cancers.  Other tests  Sexually transmitted disease (STD) testing.  Bone density scan. This is done to screen for osteoporosis. You may have this done starting at age 44. Follow these instructions at home: Eating and drinking  Eat a diet that includes fresh fruits and vegetables, whole grains, lean protein, and low-fat dairy products. Limit  your intake of foods with high amounts of sugar, saturated fats, and salt.  Take vitamin and mineral supplements as recommended by your health care provider.  Do not drink alcohol if your health care provider tells you not to drink.  If you drink alcohol: ? Limit how much you have to 0-1 drink a day. ? Be aware of how much alcohol is in your drink. In the [place] one drink equals one 12 oz bottle of beer (355 mL), one 5 oz glass of wine (148 mL), or one 1 oz glass of hard liquor (44 mL). Lifestyle  Take daily care of your teeth and gums.  Stay active. Exercise for at least 30 minutes on 5 or more days each week.  Do not use any products that contain nicotine or tobacco, such as cigarettes, e-cigarettes, and chewing tobacco. If you need help quitting, ask your health care provider.  If you are sexually active, practice safe sex. Use a condom or other form of protection in order to prevent STIs (sexually transmitted infections).  Talk with your health care provider about taking a low-dose aspirin or statin. What's next?  Go to your health care provider once a year for a well check visit.  Ask your health care provider how often you should have your eyes and teeth checked.  Stay up to date on all vaccines. This information is not intended to replace advice given to you by your health care provider. Make sure you discuss any questions you have with your health care provider. Document Revised: 02/19/2018 Document Reviewed: 02/19/2018 Elsevier Patient Education  2020 [employer].

## 2020-01-04 NOTE — Progress Notes (Signed)
Subjective:     Stephanie [name] is a 69 y.o. female and is here for a comprehensive physical exam. The patient reports no problems.  Social History   Socioeconomic History  . Marital status: Married    Spouse name: Not on file  . Number of children: 2  . Years of education: Not on file  . Highest education level: Not on file  Occupational History  . Occupation: self  Tobacco Use  . Smoking status: Never Smoker  . Smokeless tobacco: Never Used  Substance and Sexual Activity  . Alcohol use: Yes    Alcohol/week: 7.0 standard drinks    Types: 7 Standard drinks or equivalent per week  . Drug use: No  . Sexual activity: Not on file  Other Topics Concern  . Not on file  Social History Narrative   Married, 1 son and 1 daughter   Self-employed   2 caffienated beverages/day   Social Determinants of Health   Financial Resource Strain:   . Difficulty of Paying Living Expenses: Not on file  Food Insecurity:   . Worried About Charity fundraiser in the Last Year: Not on file  . Ran Out of Food in the Last Year: Not on file  Transportation Needs:   . Lack of Transportation (Medical): Not on file  . Lack of Transportation (Non-Medical): Not on file  Physical Activity:   . Days of Exercise per Week: Not on file  . Minutes of Exercise per Session: Not on file  Stress:   . Feeling of Stress : Not on file  Social Connections:   . Frequency of Communication with Friends and Family: Not on file  . Frequency of Social Gatherings with Friends and Family: Not on file  . Attends Religious Services: Not on file  . Active Member of Clubs or Organizations: Not on file  . Attends Archivist Meetings: Not on file  . Marital Status: Not on file  Intimate Partner Violence:   . Fear of Current or Ex-Partner: Not on file  . Emotionally Abused: Not on file  . Physically Abused: Not on file  . Sexually Abused: Not on file   Health Maintenance  Topic Date Due  . MAMMOGRAM  05/06/2018   . PNA vac Low Risk Adult (2 of 2 - PPSV23) 05/06/2018  . COLONOSCOPY  08/14/2018  . TETANUS/TDAP  05/13/2022  . INFLUENZA VACCINE  Completed  . DEXA SCAN  Completed  . COVID-19 Vaccine  Completed  . Hepatitis C Screening  Completed    The following portions of the patient's history were reviewed and updated as appropriate:  She  has a past medical history of Arthritis, Cataract, Complication of anesthesia, DDD (degenerative disc disease), GERD (gastroesophageal reflux disease), colonic polyps (08/20/2016), Hyperlipidemia, and Lactose intolerance. She does not have any pertinent problems on file. She  has a past surgical history that includes Total abdominal hysterectomy w/ bilateral salpingoophorectomy; Cesarean section; Colonoscopy; and Cataract extraction (Bilateral, summer 2021). Her family history includes Alzheimer's disease in her mother; Arthritis in her brother, father, mother, and sister; Cystic fibrosis in an other family member; Dementia in her mother; Hypertension in her father and mother; Liver disease in her father, paternal aunt, paternal aunt, paternal aunt, and sister. She  reports that she has never smoked. She has never used smokeless tobacco. She reports current alcohol use of about 7.0 standard drinks of alcohol per week. She reports that she does not use drugs. She has a current medication  list which includes the following prescription(s): ropinirole and vitamin d (ergocalciferol), and the following Facility-Administered Medications: sodium chloride, cyanocobalamin, and cyanocobalamin. Current Outpatient Medications on File Prior to Visit  Medication Sig Dispense Refill  . rOPINIRole (REQUIP) 0.5 MG tablet Take 2 tablets at bedtime.  May increase to 3 tablets at bedtime if needed. 270 tablet 3  . Vitamin D, Ergocalciferol, (DRISDOL) 1.25 MG (50000 UNIT) CAPS capsule Take 1 capsule (50,000 Units total) by mouth every 7 (seven) days. 12 capsule 1   Current  Facility-Administered Medications on File Prior to Visit  Medication Dose Route Frequency Provider Last Rate Last Admin  . 0.9 %  sodium chloride infusion  500 mL Intravenous Continuous Gatha Mayer, MD      . cyanocobalamin ((VITAMIN B-12)) injection 1,000 mcg  1,000 mcg Intramuscular Q30 days Carollee Herter, Kendrick Fries R, DO   1,000 mcg at 07/08/19 1511  . cyanocobalamin ((VITAMIN B-12)) injection 1,000 mcg  1,000 mcg Intramuscular Q30 days Carollee Herter, Kendrick Fries R, DO   1,000 mcg at 07/15/19 1004   She is allergic to penicillins..  Review of Systems Review of Systems  Constitutional: Negative for activity change, appetite change and fatigue.  HENT: Negative for hearing loss, congestion, tinnitus and ear discharge.  dentist q31m Eyes: Negative for visual disturbance (see optho q1y -- vision corrected to 20/20 with glasses).  Respiratory: Negative for cough, chest tightness and shortness of breath.   Cardiovascular: Negative for chest pain, palpitations and leg swelling.  Gastrointestinal: Negative for abdominal pain, diarrhea, constipation and abdominal distention.  Genitourinary: Negative for urgency, frequency, decreased urine volume and difficulty urinating.  Musculoskeletal: Negative for back pain, arthralgias and gait problem.  Skin: Negative for color change, pallor and rash.  Neurological: Negative for dizziness, light-headedness, numbness and headaches.  Hematological: Negative for adenopathy. Does not bruise/bleed easily.  Psychiatric/Behavioral: Negative for suicidal ideas, confusion, sleep disturbance, self-injury, dysphoric mood, decreased concentration and agitation.       Objective:    BP 100/78 (BP Location: Right Arm, Patient Position: Sitting, Cuff Size: Normal)   Pulse 66   Temp 97.8 F (36.6 C) (Oral)   Resp 18   Ht 5\' 3"  (1.6 m)   Wt 159 lb (72.1 kg)   SpO2 97%   BMI 28.17 kg/m  General appearance: alert, cooperative, appears stated age and no distress Head:  Normocephalic, without obvious abnormality, atraumatic Eyes: negative findings: lids and lashes normal, conjunctivae and sclerae normal and pupils equal, round, reactive to light and accomodation Ears: normal TM's and external ear canals both ears Throat: lips, mucosa, and tongue normal; teeth and gums normal --  + mole upper lip --new and getting bigger  Neck: no adenopathy, no carotid bruit, no JVD, supple, symmetrical, trachea midline and thyroid not enlarged, symmetric, no tenderness/mass/nodules Back: symmetric, no curvature. ROM normal. No CVA tenderness. Lungs: clear to auscultation bilaterally Breasts: normal appearance, no masses or tenderness Heart: regular rate and rhythm, S1, S2 normal, no murmur, click, rub or gallop Abdomen: soft, non-tender; bowel sounds normal; no masses,  no organomegaly Pelvic: not indicated; status post hysterectomy, negative ROS Extremities: extremities normal, atraumatic, no cyanosis or edema Pulses: 2+ and symmetric Skin: Skin color, texture, turgor normal. No rashes or lesions Lymph nodes: Cervical, supraclavicular, and axillary nodes normal. Neurologic: Alert and oriented X 3, normal strength and tone. Normal symmetric reflexes. Normal coordination and gait    Assessment:    Healthy female exam.       Plan:  ghm utd Check labs  See After Visit Summary for Counseling Recommendations    1. RLS (restless legs syndrome) con't  - CBC with Differential/Platelet - Iron, TIBC and Ferritin Panel  2. Vitamin D deficiency Check labs  - Vitamin D (25 hydroxy)  3. Hyperlipidemia, unspecified hyperlipidemia type Encouraged heart healthy diet, increase exercise, avoid trans fats, consider a krill oil cap daily - Lipid panel - Comprehensive metabolic panel  4. Preventative health care See above   5. Lactose intolerance Stable with diet   6. Need for pneumococcal vaccination   - Pneumococcal polysaccharide vaccine 23-valent greater than or  equal to 2yo subcutaneous/IM  7. Estrogen deficiency   - DG Bone Density; Future  8. Change in facial mole   - Ambulatory referral to Dermatology  9. Polyp of colon, unspecified part of colon, unspecified type   - Ambulatory referral to Gastroenterology

## 2020-01-05 LAB — LIPID PANEL
Cholesterol: 252 mg/dL — ABNORMAL HIGH (ref <200)
HDL: 81 mg/dL (ref >=50)
LDL Cholesterol (Calc): 155 mg/dL (calc) — ABNORMAL HIGH
Non-HDL Cholesterol (Calc): 171 mg/dL (calc) — ABNORMAL HIGH (ref <130)
Total CHOL/HDL Ratio: 3.1 (calc) (ref <5.0)
Triglycerides: 66 mg/dL (ref <150)

## 2020-01-05 LAB — COMPREHENSIVE METABOLIC PANEL
AG Ratio: 1.7 (calc) (ref 1.0–2.5)
ALT: 12 U/L (ref 6–29)
AST: 16 U/L (ref 10–35)
Albumin: 4.3 g/dL (ref 3.6–5.1)
Alkaline phosphatase (APISO): 71 U/L (ref 37–153)
BUN: 17 mg/dL (ref 7–25)
CO2: 26 mmol/L (ref 20–32)
Calcium: 9.8 mg/dL (ref 8.6–10.4)
Chloride: 106 mmol/L (ref 98–110)
Creat: 0.75 mg/dL (ref 0.50–0.99)
Globulin: 2.5 g/dL (calc) (ref 1.9–3.7)
Glucose, Bld: 104 mg/dL — ABNORMAL HIGH (ref 65–99)
Potassium: 4.9 mmol/L (ref 3.5–5.3)
Sodium: 141 mmol/L (ref 135–146)
Total Bilirubin: 0.5 mg/dL (ref 0.2–1.2)
Total Protein: 6.8 g/dL (ref 6.1–8.1)

## 2020-01-05 LAB — IRON,TIBC AND FERRITIN PANEL
%SAT: 31 % (calc) (ref 16–45)
Ferritin: 95 ng/mL (ref 16–288)
Iron: 94 ug/dL (ref 45–160)
TIBC: 308 mcg/dL (calc) (ref 250–450)

## 2020-01-05 LAB — CBC WITH DIFFERENTIAL/PLATELET
Absolute Monocytes: 546 cells/uL (ref 200–950)
Basophils Absolute: 53 cells/uL (ref 0–200)
Basophils Relative: 1 %
Eosinophils Absolute: 170 cells/uL (ref 15–500)
Eosinophils Relative: 3.2 %
HCT: 42.4 % (ref 35.0–45.0)
Hemoglobin: 14 g/dL (ref 11.7–15.5)
Lymphs Abs: 1463 cells/uL (ref 850–3900)
MCH: 30.6 pg (ref 27.0–33.0)
MCHC: 33 g/dL (ref 32.0–36.0)
MCV: 92.8 fL (ref 80.0–100.0)
MPV: 11.7 fL (ref 7.5–12.5)
Monocytes Relative: 10.3 %
Neutro Abs: 3069 cells/uL (ref 1500–7800)
Neutrophils Relative %: 57.9 %
Platelets: 231 10*3/uL (ref 140–400)
RBC: 4.57 10*6/uL (ref 3.80–5.10)
RDW: 11.9 % (ref 11.0–15.0)
Total Lymphocyte: 27.6 %
WBC: 5.3 10*3/uL (ref 3.8–10.8)

## 2020-01-05 LAB — VITAMIN D 25 HYDROXY (VIT D DEFICIENCY, FRACTURES): Vit D, 25-Hydroxy: 57 ng/mL (ref 30–100)

## 2020-04-11 ENCOUNTER — Encounter: Payer: Self-pay | Admitting: Family Medicine

## 2020-04-11 DIAGNOSIS — Z85828 Personal history of other malignant neoplasm of skin: Secondary | ICD-10-CM | POA: Diagnosis not present

## 2020-04-11 DIAGNOSIS — L820 Inflamed seborrheic keratosis: Secondary | ICD-10-CM | POA: Diagnosis not present

## 2020-04-11 DIAGNOSIS — L570 Actinic keratosis: Secondary | ICD-10-CM | POA: Diagnosis not present

## 2020-04-11 DIAGNOSIS — C44319 Basal cell carcinoma of skin of other parts of face: Secondary | ICD-10-CM | POA: Diagnosis not present

## 2020-04-11 DIAGNOSIS — L821 Other seborrheic keratosis: Secondary | ICD-10-CM | POA: Diagnosis not present

## 2020-04-11 DIAGNOSIS — D485 Neoplasm of uncertain behavior of skin: Secondary | ICD-10-CM | POA: Diagnosis not present

## 2020-04-11 DIAGNOSIS — D225 Melanocytic nevi of trunk: Secondary | ICD-10-CM | POA: Diagnosis not present

## 2020-04-24 ENCOUNTER — Encounter: Payer: Self-pay | Admitting: Family Medicine

## 2020-04-24 DIAGNOSIS — C44319 Basal cell carcinoma of skin of other parts of face: Secondary | ICD-10-CM | POA: Insufficient documentation

## 2020-05-01 DIAGNOSIS — M25572 Pain in left ankle and joints of left foot: Secondary | ICD-10-CM | POA: Diagnosis not present

## 2020-05-09 DIAGNOSIS — C44319 Basal cell carcinoma of skin of other parts of face: Secondary | ICD-10-CM | POA: Diagnosis not present

## 2020-05-09 DIAGNOSIS — Z85828 Personal history of other malignant neoplasm of skin: Secondary | ICD-10-CM | POA: Diagnosis not present

## 2020-05-16 DIAGNOSIS — L988 Other specified disorders of the skin and subcutaneous tissue: Secondary | ICD-10-CM | POA: Diagnosis not present

## 2020-05-16 DIAGNOSIS — D485 Neoplasm of uncertain behavior of skin: Secondary | ICD-10-CM | POA: Diagnosis not present

## 2020-06-02 ENCOUNTER — Ambulatory Visit (INDEPENDENT_AMBULATORY_CARE_PROVIDER_SITE_OTHER): Payer: PPO | Admitting: Family Medicine

## 2020-06-02 ENCOUNTER — Encounter: Payer: Self-pay | Admitting: Family Medicine

## 2020-06-02 ENCOUNTER — Other Ambulatory Visit: Payer: Self-pay

## 2020-06-02 VITALS — BP 140/82 | HR 81 | Temp 98.1°F | Resp 20 | Ht 63.0 in | Wt 164.4 lb

## 2020-06-02 DIAGNOSIS — R42 Dizziness and giddiness: Secondary | ICD-10-CM | POA: Diagnosis not present

## 2020-06-02 DIAGNOSIS — R5383 Other fatigue: Secondary | ICD-10-CM

## 2020-06-02 DIAGNOSIS — M2550 Pain in unspecified joint: Secondary | ICD-10-CM

## 2020-06-02 DIAGNOSIS — E785 Hyperlipidemia, unspecified: Secondary | ICD-10-CM | POA: Diagnosis not present

## 2020-06-02 DIAGNOSIS — R002 Palpitations: Secondary | ICD-10-CM | POA: Diagnosis not present

## 2020-06-02 LAB — LIPID PANEL
Cholesterol: 262 mg/dL — ABNORMAL HIGH (ref 0–200)
HDL: 87.1 mg/dL (ref >39.00)
LDL Cholesterol: 162 mg/dL — ABNORMAL HIGH (ref 0–99)
NonHDL: 175.28
Total CHOL/HDL Ratio: 3
Triglycerides: 65 mg/dL (ref 0.0–149.0)
VLDL: 13 mg/dL (ref 0.0–40.0)

## 2020-06-02 LAB — CBC WITH DIFFERENTIAL/PLATELET
Basophils Absolute: 0 10*3/uL (ref 0.0–0.1)
Basophils Relative: 0.8 % (ref 0.0–3.0)
Eosinophils Absolute: 0.1 10*3/uL (ref 0.0–0.7)
Eosinophils Relative: 1.8 % (ref 0.0–5.0)
HCT: 39.1 % (ref 36.0–46.0)
Hemoglobin: 13.3 g/dL (ref 12.0–15.0)
Lymphocytes Relative: 33.8 % (ref 12.0–46.0)
Lymphs Abs: 1.7 10*3/uL (ref 0.7–4.0)
MCHC: 34 g/dL (ref 30.0–36.0)
MCV: 93.3 fl (ref 78.0–100.0)
Monocytes Absolute: 0.5 10*3/uL (ref 0.1–1.0)
Monocytes Relative: 10.1 % (ref 3.0–12.0)
Neutro Abs: 2.7 10*3/uL (ref 1.4–7.7)
Neutrophils Relative %: 53.5 % (ref 43.0–77.0)
Platelets: 230 10*3/uL (ref 150.0–400.0)
RBC: 4.2 Mil/uL (ref 3.87–5.11)
RDW: 13.4 % (ref 11.5–15.5)
WBC: 5 10*3/uL (ref 4.0–10.5)

## 2020-06-02 LAB — VITAMIN D 25 HYDROXY (VIT D DEFICIENCY, FRACTURES): VITD: 21.95 ng/mL — ABNORMAL LOW (ref 30.00–100.00)

## 2020-06-02 LAB — TSH: TSH: 2.42 u[IU]/mL (ref 0.35–4.50)

## 2020-06-02 LAB — VITAMIN B12: Vitamin B-12: 852 pg/mL (ref 211–911)

## 2020-06-02 NOTE — Assessment & Plan Note (Signed)
Encouraged heart healthy diet, increase exercise, avoid trans fats, consider a krill oil cap daily 

## 2020-06-02 NOTE — Assessment & Plan Note (Signed)
Check labs 

## 2020-06-02 NOTE — Patient Instructions (Signed)

## 2020-06-02 NOTE — Progress Notes (Signed)
Subjective:    Patient ID: Stephanie [name], female    DOB: 1950-12-20, 70 y.o.   MRN: [id number]  Chief Complaint  Patient presents with  . Fatigue    Pt states having extreme fatigue and having dizziness occ.     HPI Patient is in today for extreme fatigue and occassional dizziness   She has had cortisone shots in her hands and plantar fascitis -- she is seeing specialists for her foot  She states she always is the energizer bunny and she has been so tired  She has had a few episodes of dizziness.  The last one occurred at night and she felt like her heart was beating irregularly.  No cp, no sob   Past Medical History:  Diagnosis Date  . Arthritis   . Cataract    beginning signs of cataract  . Complication of anesthesia    Per pt, hard to wake up past c-section/ 1 time.  . DDD (degenerative disc disease)    in feet and hands  . GERD (gastroesophageal reflux disease)   . Hx of colonic polyps 08/20/2016  . Hyperlipidemia   . Lactose intolerance     Past Surgical History:  Procedure Laterality Date  . CATARACT EXTRACTION Bilateral summer 2021   [facility]cker  . CESAREAN SECTION     x 2  . COLONOSCOPY    . TOTAL ABDOMINAL HYSTERECTOMY W/ BILATERAL SALPINGOOPHORECTOMY      Family History  Problem Relation Age of Onset  . Hypertension Mother   . Dementia Mother   . Arthritis Mother   . Alzheimer's disease Mother   . Arthritis Father   . Liver disease Father        NASH  . Hypertension Father   . Arthritis Sister   . Liver disease Sister   . Arthritis Brother   . Liver disease Paternal Aunt   . Liver disease Paternal Aunt   . Liver disease Paternal Aunt   . Cystic fibrosis Other        niece    Social History   Socioeconomic History  . Marital status: Married    Spouse name: Not on file  . Number of children: 2  . Years of education: Not on file  . Highest education level: Not on file  Occupational History  . Occupation: self  Tobacco Use  . Smoking status:  Never Smoker  . Smokeless tobacco: Never Used  Substance and Sexual Activity  . Alcohol use: Yes    Alcohol/week: 7.0 standard drinks    Types: 7 Standard drinks or equivalent per week  . Drug use: No  . Sexual activity: Yes    Partners: Male  Other Topics Concern  . Not on file  Social History Narrative   Married, 1 son and 1 daughter   Self-employed   2 caffienated beverages/day   Social Determinants of Health   Financial Resource Strain: Not on file  Food Insecurity: Not on file  Transportation Needs: Not on file  Physical Activity: Not on file  Stress: Not on file  Social Connections: Not on file  Intimate Partner Violence: Not on file    Outpatient Medications Prior to Visit  Medication Sig Dispense Refill  . rOPINIRole (REQUIP) 0.5 MG tablet Take 2 tablets at bedtime.  May increase to 3 tablets at bedtime if needed. 270 tablet 3  . Vitamin D, Ergocalciferol, (DRISDOL) 1.25 MG (50000 UNIT) CAPS capsule Take 1 capsule (50,000 Units total) by mouth every 7 (  seven) days. 12 capsule 1   Facility-Administered Medications Prior to Visit  Medication Dose Route Frequency Provider Last Rate Last Admin  . 0.9 %  sodium chloride infusion  500 mL Intravenous Continuous Gatha Mayer, MD      . cyanocobalamin ((VITAMIN B-12)) injection 1,000 mcg  1,000 mcg Intramuscular Q30 days Carollee Herter, Kendrick Fries R, DO   1,000 mcg at 07/08/19 1511  . cyanocobalamin ((VITAMIN B-12)) injection 1,000 mcg  1,000 mcg Intramuscular Q30 days Carollee Herter, Kendrick Fries R, DO   1,000 mcg at 07/15/19 1004    Allergies  Allergen Reactions  . Penicillins Anaphylaxis    Review of Systems  Constitutional: Negative for chills, fever and malaise/fatigue.  HENT: Negative for congestion and hearing loss.   Eyes: Negative for discharge.  Respiratory: Negative for cough, sputum production and shortness of breath.   Cardiovascular: Negative for chest pain, palpitations and leg swelling.  Gastrointestinal: Negative  for abdominal pain, blood in stool, constipation, diarrhea, heartburn, nausea and vomiting.  Genitourinary: Negative for dysuria, frequency, hematuria and urgency.  Musculoskeletal: Negative for back pain, falls and myalgias.  Skin: Negative for rash.  Neurological: Negative for dizziness, sensory change, loss of consciousness, weakness and headaches.  Endo/Heme/Allergies: Negative for environmental allergies. Does not bruise/bleed easily.  Psychiatric/Behavioral: Negative for depression and suicidal ideas. The patient is not nervous/anxious and does not have insomnia.        Objective:    Physical Exam Vitals and nursing note reviewed.  Constitutional:      Appearance: She is well-developed.  HENT:     Head: Normocephalic and atraumatic.  Eyes:     Conjunctiva/sclera: Conjunctivae normal.  Neck:     Thyroid: No thyromegaly.     Vascular: No carotid bruit or JVD.  Cardiovascular:     Rate and Rhythm: Normal rate and regular rhythm.     Heart sounds: Normal heart sounds. No murmur heard.   Pulmonary:     Effort: Pulmonary effort is normal. No respiratory distress.     Breath sounds: Normal breath sounds. No wheezing or rales.  Chest:     Chest wall: No tenderness.  Musculoskeletal:     Cervical back: Normal range of motion and neck supple.  Neurological:     Mental Status: She is alert and oriented to person, place, and time.       BP 140/82 (BP Location: Right Arm, Patient Position: Sitting, Cuff Size: Normal)   Pulse 81   Temp 98.1 F (36.7 C) (Oral)   Resp 20   Ht 5\' 3"  (1.6 m)   Wt 164 lb 6.4 oz (74.6 kg)   SpO2 96%   BMI 29.12 kg/m  Wt Readings from Last 3 Encounters:  06/02/20 164 lb 6.4 oz (74.6 kg)  01/04/20 159 lb (72.1 kg)  06/21/19 159 lb 6.4 oz (72.3 kg)    Diabetic Foot Exam - Simple   No data filed    Lab Results  Component Value Date   WBC 5.3 01/04/2020   HGB 14.0 01/04/2020   HCT 42.4 01/04/2020   PLT 231 01/04/2020   GLUCOSE 104 (H)  01/04/2020   CHOL 252 (H) 01/04/2020   TRIG 66 01/04/2020   HDL 81 01/04/2020   LDLDIRECT 103.2 10/15/2011   LDLCALC 155 (H) 01/04/2020   ALT 12 01/04/2020   AST 16 01/04/2020   NA 141 01/04/2020   K 4.9 01/04/2020   CL 106 01/04/2020   CREATININE 0.75 01/04/2020   BUN 17 01/04/2020  CO2 26 01/04/2020   TSH 2.70 06/21/2019    Lab Results  Component Value Date   TSH 2.70 06/21/2019   Lab Results  Component Value Date   WBC 5.3 01/04/2020   HGB 14.0 01/04/2020   HCT 42.4 01/04/2020   MCV 92.8 01/04/2020   PLT 231 01/04/2020   Lab Results  Component Value Date   NA 141 01/04/2020   K 4.9 01/04/2020   CO2 26 01/04/2020   GLUCOSE 104 (H) 01/04/2020   BUN 17 01/04/2020   CREATININE 0.75 01/04/2020   BILITOT 0.5 01/04/2020   ALKPHOS 72 06/21/2019   AST 16 01/04/2020   ALT 12 01/04/2020   PROT 6.8 01/04/2020   ALBUMIN 4.2 06/21/2019   CALCIUM 9.8 01/04/2020   GFR 75.85 06/21/2019   Lab Results  Component Value Date   CHOL 252 (H) 01/04/2020   Lab Results  Component Value Date   HDL 81 01/04/2020   Lab Results  Component Value Date   LDLCALC 155 (H) 01/04/2020   Lab Results  Component Value Date   TRIG 66 01/04/2020   Lab Results  Component Value Date   CHOLHDL 3.1 01/04/2020   No results found for: HGBA1C     Assessment & Plan:   Problem List Items Addressed This Visit      Unprioritized   Arthralgia   Relevant Orders   Antinuclear Antib (ANA)   Rheumatoid Factor   Dizziness    ekg ---  Nonspecific t wave changes --- compared to 10/2010-- occass pac otherwise normal Check labs        Relevant Orders   TSH   CBC with Differential/Platelet   Vitamin B12   Vitamin D (25 hydroxy)   Lipid panel   EKG 12-Lead (Completed)   ECHOCARDIOGRAM COMPLETE   Fatigue - Primary    Check labs       Relevant Orders   TSH   CBC with Differential/Platelet   Vitamin B12   Vitamin D (25 hydroxy)   Lipid panel   Hyperlipidemia    Encouraged heart  healthy diet, increase exercise, avoid trans fats, consider a krill oil cap daily      Relevant Orders   Lipid panel   Palpitations    Check labs and echo       Relevant Orders   ECHOCARDIOGRAM COMPLETE      I am having Stephanie Carry. Murray "Pam" maintain her rOPINIRole and Vitamin D (Ergocalciferol). We will continue to administer sodium chloride, cyanocobalamin, and cyanocobalamin.  No orders of the defined types were placed in this encounter.    [name], DO

## 2020-06-02 NOTE — Assessment & Plan Note (Signed)
ekg ---  Nonspecific t wave changes --- compared to 10/2010-- occass pac otherwise normal Check labs

## 2020-06-02 NOTE — Assessment & Plan Note (Signed)
Check labs and echo

## 2020-06-05 LAB — ANTI-NUCLEAR AB-TITER (ANA TITER): ANA Titer 1: 1:160 {titer} — ABNORMAL HIGH

## 2020-06-05 LAB — RHEUMATOID FACTOR: Rheumatoid fact SerPl-aCnc: <14 IU/mL (ref <14)

## 2020-06-05 LAB — ANA: Anti Nuclear Antibody (ANA): POSITIVE — AB

## 2020-06-06 ENCOUNTER — Telehealth: Payer: Self-pay

## 2020-06-06 ENCOUNTER — Encounter: Payer: Self-pay | Admitting: Family Medicine

## 2020-06-06 ENCOUNTER — Other Ambulatory Visit: Payer: Self-pay | Admitting: Family Medicine

## 2020-06-06 ENCOUNTER — Other Ambulatory Visit: Payer: Self-pay

## 2020-06-06 DIAGNOSIS — R768 Other specified abnormal immunological findings in serum: Secondary | ICD-10-CM

## 2020-06-06 DIAGNOSIS — E785 Hyperlipidemia, unspecified: Secondary | ICD-10-CM

## 2020-06-06 DIAGNOSIS — M2550 Pain in unspecified joint: Secondary | ICD-10-CM

## 2020-06-06 DIAGNOSIS — E559 Vitamin D deficiency, unspecified: Secondary | ICD-10-CM

## 2020-06-06 MED ORDER — VITAMIN D (ERGOCALCIFEROL) 1.25 MG (50000 UNIT) PO CAPS: 50000.0000 [IU] | ORAL_CAPSULE | ORAL | 1 refills | Status: DC

## 2020-06-06 MED ORDER — ATORVASTATIN CALCIUM 10 MG PO TABS: 10.0000 mg | ORAL_TABLET | Freq: Every day | ORAL | 3 refills | Status: DC

## 2020-06-06 NOTE — Telephone Encounter (Signed)
Pt sent a MyChart message asking if she should be worried about her ANA results. I let her know that once you've had a chance to review the labs we would advise her on your opinion.

## 2020-06-27 ENCOUNTER — Other Ambulatory Visit: Payer: Self-pay | Admitting: Family Medicine

## 2020-06-27 DIAGNOSIS — G2581 Restless legs syndrome: Secondary | ICD-10-CM

## 2020-07-10 DIAGNOSIS — Z85828 Personal history of other malignant neoplasm of skin: Secondary | ICD-10-CM | POA: Diagnosis not present

## 2020-07-10 DIAGNOSIS — L570 Actinic keratosis: Secondary | ICD-10-CM | POA: Diagnosis not present

## 2020-07-11 ENCOUNTER — Encounter: Payer: Self-pay | Admitting: Internal Medicine

## 2020-07-11 ENCOUNTER — Ambulatory Visit: Payer: PPO | Admitting: Internal Medicine

## 2020-07-11 ENCOUNTER — Ambulatory Visit: Payer: Self-pay

## 2020-07-11 ENCOUNTER — Other Ambulatory Visit: Payer: Self-pay

## 2020-07-11 VITALS — BP 121/82 | HR 72 | Ht 63.0 in | Wt 161.4 lb

## 2020-07-11 DIAGNOSIS — M19031 Primary osteoarthritis, right wrist: Secondary | ICD-10-CM

## 2020-07-11 DIAGNOSIS — R768 Other specified abnormal immunological findings in serum: Secondary | ICD-10-CM | POA: Insufficient documentation

## 2020-07-11 DIAGNOSIS — M25532 Pain in left wrist: Secondary | ICD-10-CM | POA: Insufficient documentation

## 2020-07-11 DIAGNOSIS — M19032 Primary osteoarthritis, left wrist: Secondary | ICD-10-CM

## 2020-07-11 DIAGNOSIS — M1991 Primary osteoarthritis, unspecified site: Secondary | ICD-10-CM

## 2020-07-11 DIAGNOSIS — M25531 Pain in right wrist: Secondary | ICD-10-CM | POA: Diagnosis not present

## 2020-07-11 NOTE — Progress Notes (Signed)
Office Visit Note  Patient: Stephanie [name]             Date of Birth: Jan 23, 1951           MRN: [id number]             PCP: [name], DO Referring: [name], * Visit Date: 07/11/2020   Subjective:  New Patient (Initial Visit) (Patient complains of extreme bilateral wrist pain as well as bilateral hand pain. Patient is currently dealing with plantar fasciitis in the left foot. )   History of Present Illness: Stephanie [name] is a 70 y.o. female here for evaluation of positive ANA with fatigue and joint pain symptoms. She has increased pain in her bilateral hands and wrists for about 6 months with no known preceding health events or changes. She hs a prior history of osteoarthritis chronically affecting her fingers especially base of the thumb but not this current problem. She has previously experienced carpal tunnel syndrome when working with sewing but does not think this is similar. She notices improvement with NSAIDs but she cannot tolerate taking high doses of these or multiple times per day due to abdominal and epigastric pain and diarrhea. She does not notice much swelling, warmth or discoloration of the affected areas. Workup in primary care office demonstrated a positive ANA and negative RF test. She denies noticing other new health problems such as hair loss, oral ulcers, lymphadenopathy, raynaud's phenomenon, rashes, or fevers.   Labs reviewed 05/2020 ANA 1:160 centromere RF neg Vit D 21.92  DXA 04/2017 Result reviewed, shows osteopenia  Activities of Daily Living:  Patient reports morning stiffness for 0 minutes.   Patient Reports nocturnal pain.  Difficulty dressing/grooming: Denies Difficulty climbing stairs: Denies Difficulty getting out of chair: Denies Difficulty using hands for taps, buttons, cutlery, and/or writing: Reports  Review of Systems  Constitutional: Positive for fatigue.  HENT: Negative for mouth sores, mouth dryness and nose  dryness.   Eyes: Negative for pain, itching, visual disturbance and dryness.  Respiratory: Positive for shortness of breath. Negative for cough, hemoptysis and difficulty breathing.   Cardiovascular: Negative for chest pain, palpitations and swelling in legs/feet.  Gastrointestinal: Positive for diarrhea. Negative for abdominal pain, blood in stool and constipation.  Endocrine: Negative for increased urination.  Genitourinary: Negative for painful urination.  Musculoskeletal: Positive for arthralgias and joint pain. Negative for joint swelling, myalgias, muscle weakness, morning stiffness, muscle tenderness and myalgias.  Skin: Negative for color change, rash and redness.  Allergic/Immunologic: Negative for susceptible to infections.  Neurological: Negative for dizziness, numbness, headaches, memory loss and weakness.  Hematological: Negative for swollen glands.  Psychiatric/Behavioral: Positive for sleep disturbance. Negative for confusion.    PMFS History:  Patient Active Problem List   Diagnosis Date Noted  . Positive ANA (antinuclear antibody) 07/11/2020  . Bilateral wrist pain 07/11/2020  . Fatigue 06/02/2020  . Dizziness 06/02/2020  . Arthralgia 06/02/2020  . Palpitations 06/02/2020  . Basal cell carcinoma (BCC) of forehead 04/24/2020  . Lactose intolerance   . Snoring 05/16/2018  . OSA (obstructive sleep apnea) 05/16/2018  . Restless legs 01/26/2018  . Insomnia 01/26/2018  . Snores 01/26/2018  . Hx of colonic polyps 08/20/2016  . BACK PAIN 02/29/2008  . Hyperlipidemia 04/23/2007  . Osteoarthritis 04/23/2007  . [place] DISEASE, LUMBAR 04/23/2007    Past Medical History:  Diagnosis Date  . Arthritis   . Cataract    beginning signs of cataract  . Complication  of anesthesia    Per pt, hard to wake up past c-section/ 1 time.  . DDD (degenerative disc disease)    in feet and hands  . GERD (gastroesophageal reflux disease)   . Hx of colonic polyps 08/20/2016  .  Hyperlipidemia   . Lactose intolerance     Family History  Problem Relation Age of Onset  . Hypertension Mother   . Dementia Mother   . Arthritis Mother   . Alzheimer's disease Mother   . Arthritis Father   . Liver disease Father        NASH  . Hypertension Father   . Arthritis Sister   . Liver disease Sister   . Arthritis Brother   . Liver disease Paternal Aunt   . Liver disease Paternal Aunt   . Liver disease Paternal Aunt   . Cystic fibrosis Other        niece  . Healthy Son   . Healthy Daughter    Past Surgical History:  Procedure Laterality Date  . CATARACT EXTRACTION Bilateral summer 2021   [facility]  . [place]  . COLONOSCOPY    . TOTAL ABDOMINAL HYSTERECTOMY W/ BILATERAL SALPINGOOPHORECTOMY     Social History   Social History Narrative   Married, 1 son and 1 daughter   Self-employed   2 caffienated beverages/day   Immunization History  Administered Date(s) Administered  . Influenza, High Dose Seasonal PF 01/23/2018  . Influenza,inj,Quad PF,6+ Mos 12/08/2012  . Influenza-Unspecified 12/31/2019  . PFIZER(Purple Top)SARS-COV-2 Vaccination 04/16/2019, 05/12/2019, 12/31/2019  . Pneumococcal Conjugate-13 05/06/2017  . Pneumococcal Polysaccharide-23 01/04/2020  . Td 06/28/2004  . Tdap 05/12/2012  . Zoster 05/12/2012     Objective: Vital Signs: BP 121/82 (BP Location: Right Arm, Patient Position: Sitting, Cuff Size: Normal)   Pulse 72   Ht 5\' 3"  (1.6 m)   Wt 161 lb 6.4 oz (73.2 kg)   BMI 28.59 kg/m    Physical Exam HENT:     Right Ear: External ear normal.     Left Ear: External ear normal.     Mouth/Throat:     Mouth: Mucous membranes are moist.     Pharynx: Oropharynx is clear.  Eyes:     Conjunctiva/sclera: Conjunctivae normal.  Cardiovascular:     Rate and Rhythm: Normal rate and regular rhythm.  Pulmonary:     Effort: Pulmonary effort is normal.     Breath sounds: Normal breath sounds.  Skin:    General: Skin is warm  and dry.     Findings: No rash.     Comments: Mild tinea versicolor pattern of skin change on shins Well healing BCC excision site on forehead  Neurological:     General: No focal deficit present.     Mental Status: She is alert.     Deep Tendon Reflexes: Reflexes normal.  Psychiatric:        Mood and Affect: Mood normal.    Musculoskeletal Exam:  Neck full ROM no tenderness Shoulders ROM slightly reduced reaching behind head, no tenderness or swelling Elbows full ROM no tenderness or swelling Wrists slightly reduced extension ROM worse on left, mild swelling or soft tissue thickening Fingers full ROM no tenderness squaring of 1st CMC joints, DIP heberdon's nodes on both hands with lateral deviation of 2nd-3rd distal phalanges Hip normal internal and external rotation without pain, no tenderness to lateral hip palpation Knees full ROM no tenderness or swelling, mild patellofemoral crepitus present Ankles  full ROM no tenderness or swelling, no tenderness to heel squeeze or palpation on bottom of heel MTPs 1st MTP extension ROM limited, no tenderness or swelling  Investigation: No additional findings.  Imaging: XR Hand 2 View Left  Result Date: 07/11/2020 X-ray left hand 2 views Radiocarpal joint space appears normal.  Multiple cystic changes and carpal bones.  Advanced degenerative change of the first [facility] joint with osteophytes and slight first metacarpal subluxation.  Joint space narrowing in MCPs with osteophyte formation at second and third digits.  Asymmetric joint space narrowing and sclerosis in DIPs without much osteophyte formation.  No erosive changes are seen.  Bones appear borderline for generalized osteopenia. Impression Arthritis changes in the wrist first [facility] and DIP joints suggest degenerative arthritis, however significant MCP joint changes may be seen with secondary causes such as inflammatory or crystalline arthropathy  XR Hand 2 View Right  Result Date:  07/11/2020 X-ray right hand 2 views Radiocarpal joint space appears normal.  Multiple cystic changes in the carpal bones.  Advanced degenerative arthritis of the first [facility] joint with subluxation of first metacarpal.  Degenerative arthritic changes and bone spurring in the first IP joint.  MCP joint space narrowing and osteophyte formation in the second and third MCPs.  PIP joint spaces look intact there is significant asymmetric narrowing sclerosis and deviation of the DIP joints 2 through 4 worse than the third digit.  Bones appear likely generalized osteopenia. Impression Cystic changes and first [facility] joint arthritis and DIP arthritis suggesting degenerative arthritis although second and third MCP changes and subluxation may indicate secondary causes   Recent Labs: Lab Results  Component Value Date   WBC 5.0 06/02/2020   HGB 13.3 06/02/2020   PLT 230.0 06/02/2020   NA 141 01/04/2020   K 4.9 01/04/2020   CL 106 01/04/2020   CO2 26 01/04/2020   GLUCOSE 104 (H) 01/04/2020   BUN 17 01/04/2020   CREATININE 0.75 01/04/2020   BILITOT 0.5 01/04/2020   ALKPHOS 72 06/21/2019   AST 16 01/04/2020   ALT 12 01/04/2020   PROT 6.8 01/04/2020   ALBUMIN 4.2 06/21/2019   CALCIUM 9.8 01/04/2020    Speciality Comments: No specialty comments available.  Procedures:  No procedures performed Allergies: Penicillins   Assessment / Plan:     Visit Diagnoses: Positive ANA (antinuclear antibody) - Plan: Cyclic citrul peptide antibody, IgG, Anti-scleroderma antibody, RNP Antibody, Anti-Smith antibody, Sjogrens syndrome-A extractable nuclear antibody, Sjogrens syndrome-B extractable nuclear antibody, Anti-DNA antibody, double-stranded, Centromere Antibodies  Positive ANA but does not demonstrate clinical criteria of systemic lupus or systemic connective tissue disease at this time.  We will check more specific serology work-up including CCP, SCL 70, RNP, Smith, SSA, SSB, dsDNA, centromere antibodies.  Initial  assessment low pretest probability for ANA related disease as cause of current symptoms.  Bilateral wrist pain Primary osteoarthritis, unspecified site - Plan: XR Hand 2 View Right, XR Hand 2 View Left  Did not see any obvious inflammatory joint changes although involvement of wrist and MCP joints bilaterally suggests possible inflammatory or other secondary OA contribution.  Checking bilateral hand x-rays and recommending follow-up within a few weeks to reassess.   Orders: Orders Placed This Encounter  Procedures  . XR Hand 2 View Right  . XR Hand 2 View Left  . Cyclic citrul peptide antibody, IgG  . Anti-scleroderma antibody  . RNP Antibody  . Anti-Smith antibody  . Sjogrens syndrome-A extractable nuclear antibody  . Sjogrens syndrome-B extractable nuclear antibody  .  Anti-DNA antibody, double-stranded  . Centromere Antibodies   No orders of the defined types were placed in this encounter.    Follow-Up Instructions: Return in about 2 weeks (around 07/25/2020) for New pt f/u positive ANA, OA, hand and wrist pain.   Collier Salina, MD  Note - This record has been created using [employer].  Chart creation errors have been sought, but may not always  have been located. Such creation errors do not reflect on  the standard of medical care.

## 2020-07-12 LAB — RNP ANTIBODY: Ribonucleic Protein(ENA) Antibody, IgG: <1 AI

## 2020-07-12 LAB — ANTI-SMITH ANTIBODY: ENA SM Ab Ser-aCnc: <1 AI

## 2020-07-12 LAB — CYCLIC CITRUL PEPTIDE ANTIBODY, IGG: Cyclic Citrullin Peptide Ab: <16 UNITS

## 2020-07-12 LAB — ANTI-DNA ANTIBODY, DOUBLE-STRANDED: ds DNA Ab: 5 IU/mL — ABNORMAL HIGH

## 2020-07-12 LAB — CENTROMERE ANTIBODIES: Centromere Ab Screen: 3.7 AI — AB

## 2020-07-12 LAB — SJOGRENS SYNDROME-A EXTRACTABLE NUCLEAR ANTIBODY: SSA (Ro) (ENA) Antibody, IgG: <1 AI

## 2020-07-12 LAB — ANTI-SCLERODERMA ANTIBODY: Scleroderma (Scl-70) (ENA) Antibody, IgG: <1 AI

## 2020-07-12 LAB — SJOGRENS SYNDROME-B EXTRACTABLE NUCLEAR ANTIBODY: SSB (La) (ENA) Antibody, IgG: <1 AI

## 2020-07-12 NOTE — Progress Notes (Signed)
Lab tests show low positive antibodies for lupus, rheumatoid arthritis test is negative. Hand xrays show osteoarthritis but there is also changes in the large knuckle joints that suggests inflammation problem has also been present at some point. I do not think we need to start any specific drug for this right now but can follow up as planned to take another look and discuss.

## 2020-07-13 ENCOUNTER — Encounter: Payer: Self-pay | Admitting: Internal Medicine

## 2020-07-14 MED ORDER — PREDNISONE 10 MG PO TABS: 10.0000 mg | ORAL_TABLET | Freq: Every day | ORAL | 0 refills | Status: DC

## 2020-08-01 ENCOUNTER — Ambulatory Visit: Payer: PPO | Admitting: Internal Medicine

## 2020-08-09 ENCOUNTER — Ambulatory Visit: Payer: PPO | Admitting: Internal Medicine

## 2020-08-09 ENCOUNTER — Other Ambulatory Visit: Payer: Self-pay

## 2020-08-09 ENCOUNTER — Ambulatory Visit (HOSPITAL_COMMUNITY)
Admission: RE | Admit: 2020-08-09 | Discharge: 2020-08-09 | Disposition: A | Discharge status: Home / Self Care | Payer: PPO | Source: Ambulatory Visit | Attending: Family Medicine | Admitting: Family Medicine

## 2020-08-09 ENCOUNTER — Encounter: Payer: Self-pay | Admitting: Internal Medicine

## 2020-08-09 VITALS — BP 126/87 | HR 89 | Ht 63.5 in | Wt 161.4 lb

## 2020-08-09 DIAGNOSIS — M2550 Pain in unspecified joint: Secondary | ICD-10-CM

## 2020-08-09 DIAGNOSIS — R42 Dizziness and giddiness: Secondary | ICD-10-CM | POA: Diagnosis not present

## 2020-08-09 DIAGNOSIS — R002 Palpitations: Secondary | ICD-10-CM | POA: Diagnosis not present

## 2020-08-09 DIAGNOSIS — R9431 Abnormal electrocardiogram [ECG] [EKG]: Secondary | ICD-10-CM | POA: Diagnosis not present

## 2020-08-09 DIAGNOSIS — R768 Other specified abnormal immunological findings in serum: Secondary | ICD-10-CM | POA: Diagnosis not present

## 2020-08-09 DIAGNOSIS — M1991 Primary osteoarthritis, unspecified site: Secondary | ICD-10-CM

## 2020-08-09 DIAGNOSIS — E785 Hyperlipidemia, unspecified: Secondary | ICD-10-CM | POA: Diagnosis not present

## 2020-08-09 DIAGNOSIS — M1990 Unspecified osteoarthritis, unspecified site: Secondary | ICD-10-CM | POA: Insufficient documentation

## 2020-08-09 LAB — ECHOCARDIOGRAM COMPLETE
Area-P 1/2: 2.11 cm2
S' Lateral: 2.3 cm

## 2020-08-09 MED ORDER — HYDROXYCHLOROQUINE SULFATE 200 MG PO TABS: 300.0000 mg | ORAL_TABLET | Freq: Every day | ORAL | 2 refills | Status: DC

## 2020-08-09 MED ORDER — PREDNISONE 10 MG PO TABS: 10.0000 mg | ORAL_TABLET | Freq: Every day | ORAL | 2 refills | Status: DC

## 2020-08-09 NOTE — Progress Notes (Signed)
Office Visit Note  Patient: Stephanie [name]             Date of Birth: [birth year]           MRN: [id number]             PCP: [name], DO Referring: [name], * Visit Date: 08/09/2020    Subjective:  Follow-up (Patient complains of continued bilateral wrist pain. Patient is taking 10 mg of Prednisone daily which helps but has not resolved the pain. )   History of Present Illness: Stephanie [name] is a 70 y.o. female here for follow up for joint pains in bilateral wrists after initial visit and starting trial of prednisone 10 mg daily she feels this has helped but not completely improving symptoms. Besides joint pain she does report fatigue and has had difficulty sleeping although this predates and she does not attribute to the current arthritis symptoms.   Review of Systems  Constitutional:  Positive for fatigue.  HENT:  Negative for mouth sores, mouth dryness and nose dryness.   Eyes:  Negative for pain, itching, visual disturbance and dryness.  Respiratory:  Positive for shortness of breath and difficulty breathing. Negative for cough and hemoptysis.   Cardiovascular:  Negative for chest pain, palpitations and swelling in legs/feet.  Gastrointestinal:  Negative for abdominal pain, blood in stool, constipation and diarrhea.  Endocrine: Negative for increased urination.  Genitourinary:  Negative for painful urination.  Musculoskeletal:  Positive for joint pain, joint pain, joint swelling and morning stiffness. Negative for myalgias, muscle weakness, muscle tenderness and myalgias.  Skin:  Negative for color change, rash and redness.  Allergic/Immunologic: Negative for susceptible to infections.  Neurological:  Positive for weakness. Negative for dizziness, numbness, headaches and memory loss.  Hematological:  Negative for swollen glands.  Psychiatric/Behavioral:  Positive for sleep disturbance. Negative for confusion.    PMFS History:  Patient Active Problem  List   Diagnosis Date Noted   Inflammatory arthritis 08/09/2020   Positive ANA (antinuclear antibody) 07/11/2020   Bilateral wrist pain 07/11/2020   Fatigue 06/02/2020   Dizziness 06/02/2020   Arthralgia 06/02/2020   Palpitations 06/02/2020   Basal cell carcinoma (BCC) of forehead 04/24/2020   Lactose intolerance    Snoring 05/16/2018   OSA (obstructive sleep apnea) 05/16/2018   Restless legs 01/26/2018   Insomnia 01/26/2018   Snores 01/26/2018   Hx of colonic polyps 08/20/2016   BACK PAIN 02/29/2008   Hyperlipidemia 04/23/2007   Osteoarthritis 04/23/2007   [place] DISEASE, LUMBAR 04/23/2007    Past Medical History:  Diagnosis Date   Arthritis    Cataract    beginning signs of cataract   Complication of anesthesia    Per pt, hard to wake up past c-section/ 1 time.   DDD (degenerative disc disease)    in feet and hands   GERD (gastroesophageal reflux disease)    Hx of colonic polyps 08/20/2016   Hyperlipidemia    Lactose intolerance     Family History  Problem Relation Age of Onset   Hypertension Mother    Dementia Mother    Arthritis Mother    Alzheimer's disease Mother    Arthritis Father    Liver disease Father        NASH   Hypertension Father    Arthritis Sister    Liver disease Sister    Arthritis Brother    Liver disease Paternal Aunt    Liver disease Paternal Aunt  Liver disease Paternal Aunt    Cystic fibrosis Other        niece   Healthy Son    Healthy Daughter    Past Surgical History:  Procedure Laterality Date   CATARACT EXTRACTION Bilateral summer 2021   hecker   [place]   COLONOSCOPY     TOTAL ABDOMINAL HYSTERECTOMY W/ BILATERAL SALPINGOOPHORECTOMY     Social History   Social History Narrative   Married, 1 son and 1 daughter   Self-employed   2 caffienated beverages/day   Immunization History  Administered Date(s) Administered   Influenza, High Dose Seasonal PF 01/23/2018   Influenza,inj,Quad PF,6+ Mos  12/08/2012   Influenza-Unspecified 12/31/2019   PFIZER(Purple Top)SARS-COV-2 Vaccination 04/16/2019, 05/12/2019, 12/31/2019   Pneumococcal Conjugate-13 05/06/2017   Pneumococcal Polysaccharide-23 01/04/2020   Td 06/28/2004   Tdap 05/12/2012   Zoster, Live 05/12/2012     Objective: Vital Signs: BP 126/87 (BP Location: Left Arm, Patient Position: Sitting, Cuff Size: Normal)   Pulse 89   Ht 5' 3.5" (1.613 m)   Wt 161 lb 6.4 oz (73.2 kg)   BMI 28.14 kg/m    Physical Exam Skin:    General: Skin is warm and dry.  Neurological:     General: No focal deficit present.     Mental Status: She is alert.  Psychiatric:        Mood and Affect: Mood normal.     Musculoskeletal Exam:  Wrists slightly reduced extension ROM L>R, mild swelling Fingers full ROM, no tenderness, squaring of 1st CMC joints, DIP heberdon's nodes on both hands with lateral deviation of 2nd-3rd distal phalanges Knees full ROM no tenderness or swelling, mild patellofemoral crepitus present  Investigation: No additional findings.  Imaging: ECHOCARDIOGRAM COMPLETE  Result Date: 08/09/2020    ECHOCARDIOGRAM REPORT   Patient Name:   Stephanie [name] [facility] Date of Exam: 08/09/2020 Medical Rec #:  [id number]      Height:       63.0 in Accession #:    [id number]     Weight:       161.4 lb Date of Birth:  1950/03/31       BSA:          1.765 m Patient Age:    27 years       BP:           142/86 mmHg Patient Gender: F              HR:           74 bpm. Exam Location:  Outpatient Procedure: 2D Echo, Cardiac Doppler and Color Doppler Indications:    R94.31 Abnormal EKG  History:        Patient has no prior history of Echocardiogram examinations.                 Risk Factors:Dyslipidemia.  Sonographer:    Bernadene Person RDCS Referring Phys: [place]  1. Left ventricular ejection fraction, by estimation, is 60 to 65%. The left ventricle has normal function. The left ventricle has no regional wall motion abnormalities.  Left ventricular diastolic parameters are consistent with Grade I diastolic dysfunction (impaired relaxation).  2. Right ventricular systolic function is normal. The right ventricular size is normal. The estimated right ventricular systolic pressure is 25.3 mmHg.  3. The mitral valve is normal in structure. Trivial mitral valve regurgitation. No evidence of mitral stenosis.  4. The aortic  valve is tricuspid. Aortic valve regurgitation is not visualized. No aortic stenosis is present. Comparison(s): No prior Echocardiogram. FINDINGS  Left Ventricle: Left ventricular ejection fraction, by estimation, is 60 to 65%. The left ventricle has normal function. The left ventricle has no regional wall motion abnormalities. The left ventricular internal cavity size was normal in size. There is  no left ventricular hypertrophy. Left ventricular diastolic parameters are consistent with Grade I diastolic dysfunction (impaired relaxation). Right Ventricle: The right ventricular size is normal. No increase in right ventricular wall thickness. Right ventricular systolic function is normal. The tricuspid regurgitant velocity is 2.40 m/s, and with an assumed right atrial pressure of 3 mmHg, the estimated right ventricular systolic pressure is 67.5 mmHg. Left Atrium: Left atrial size was normal in size. Right Atrium: Right atrial size was normal in size. Pericardium: There is no evidence of pericardial effusion. Mitral Valve: The mitral valve is normal in structure. Trivial mitral valve regurgitation. No evidence of mitral valve stenosis. Tricuspid Valve: The tricuspid valve is normal in structure. Tricuspid valve regurgitation is trivial. Aortic Valve: The aortic valve is tricuspid. Aortic valve regurgitation is not visualized. No aortic stenosis is present. Pulmonic Valve: The pulmonic valve was not well visualized. Pulmonic valve regurgitation is not visualized. Aorta: The aortic root and ascending aorta are structurally normal, with  no evidence of dilitation. IAS/Shunts: No atrial level shunt detected by color flow Doppler.  LEFT VENTRICLE PLAX 2D LVIDd:         3.80 cm  Diastology LVIDs:         2.30 cm  LV e' medial:    5.63 cm/s LV PW:         0.80 cm  LV E/e' medial:  8.9 LV IVS:        1.00 cm  LV e' lateral:   4.20 cm/s LVOT diam:     1.90 cm  LV E/e' lateral: 11.9 LV SV:         44 LV SV Index:   25 LVOT Area:     2.84 cm  RIGHT VENTRICLE RV S prime:     12.50 cm/s TAPSE (M-mode): 1.6 cm LEFT ATRIUM             Index       RIGHT ATRIUM          Index LA diam:        3.00 cm 1.70 cm/m  RA Area:     9.50 cm LA Vol (A2C):   29.9 ml 16.94 ml/m RA Volume:   17.80 ml 10.08 ml/m LA Vol (A4C):   24.7 ml 13.99 ml/m LA Biplane Vol: 27.6 ml 15.64 ml/m  AORTIC VALVE LVOT Vmax:   80.60 cm/s LVOT Vmean:  52.700 cm/s LVOT VTI:    0.155 m  AORTA Ao Root diam: 2.90 cm Ao Asc diam:  2.80 cm MITRAL VALVE               TRICUSPID VALVE MV Area (PHT): 2.11 cm    TR Peak grad:   23.0 mmHg MV Decel Time: 359 msec    TR Vmax:        240.00 cm/s MV E velocity: 50.10 cm/s MV A velocity: 69.00 cm/s  SHUNTS MV E/A ratio:  0.73        Systemic VTI:  0.16 m  Systemic Diam: 1.90 cm Gwyndolyn Kaufman MD Electronically signed by Gwyndolyn Kaufman MD Signature Date/Time: 08/09/2020/12:07:30 PM    Final      Recent Labs: Lab Results  Component Value Date   WBC 5.0 06/02/2020   HGB 13.3 06/02/2020   PLT 230.0 06/02/2020   NA 141 01/04/2020   K 4.9 01/04/2020   CL 106 01/04/2020   CO2 26 01/04/2020   GLUCOSE 104 (H) 01/04/2020   BUN 17 01/04/2020   CREATININE 0.75 01/04/2020   BILITOT 0.5 01/04/2020   ALKPHOS 72 06/21/2019   AST 16 01/04/2020   ALT 12 01/04/2020   PROT 6.8 01/04/2020   ALBUMIN 4.2 06/21/2019   CALCIUM 9.8 01/04/2020    Speciality Comments: No specialty comments available.  Procedures:  No procedures performed Allergies: Penicillins   Assessment / Plan:     Visit Diagnoses: Inflammatory arthritis  - Plan: hydroxychloroquine (PLAQUENIL) 200 MG tablet, predniSONE (DELTASONE) 10 MG tablet  She appears to have a seronegative inflammatory arthritis although weakly positive centromere and dsDNA antibodies could be indicative for a lupus arthritis was a possible cause.  Given the amount of symptom relief and functional improvement would recommend continuing low-dose prednisone 10 mg daily at this time.  Start hydroxychloroquine 300 mg p.o. daily for inflammatory arthritis.  Positive ANA (antinuclear antibody)  Besides joint pain and inflammation no other clinical criteria or serologic evidence of systemic lupus or related disease at this time.  The low positive anticentromere antibodies without peripheral skin change Raynaud's phenomenon or other features of systemic sclerosis so do not recommend additional work-up at this time.  Primary osteoarthritis, unspecified site  There is also significant osteoarthritis somewhat hard to differentiate the active disease versus chronic changes.    Orders: No orders of the defined types were placed in this encounter.  Meds ordered this encounter  Medications   hydroxychloroquine (PLAQUENIL) 200 MG tablet    Sig: Take 1.5 tablets (300 mg total) by mouth daily.    Dispense:  45 tablet    Refill:  2   predniSONE (DELTASONE) 10 MG tablet    Sig: Take 1 tablet (10 mg total) by mouth daily with breakfast.    Dispense:  30 tablet    Refill:  2     Follow-Up Instructions: Return in about 8 weeks (around 10/04/2020) for Inflammatory arthritis.   Collier Salina, MD  Note - This record has been created using [employer].  Chart creation errors have been sought, but may not always  have been located. Such creation errors do not reflect on  the standard of medical care.

## 2020-08-09 NOTE — Progress Notes (Signed)
  Echocardiogram 2D Echocardiogram has been performed.  Stephanie [name] 08/09/2020, 9:52 AM

## 2020-08-09 NOTE — Patient Instructions (Signed)
Hydroxychloroquine tablets What is this medicine? HYDROXYCHLOROQUINE (hye drox ee KLOR oh kwin) is used to treat rheumatoid arthritis and systemic lupus erythematosus. It is also used to treat malaria. This medicine may be used for other purposes; ask your health care provider or pharmacist if you have questions. COMMON BRAND NAME(S): Plaquenil, Quineprox What should I tell my health care provider before I take this medicine? They need to know if you have any of these conditions:  diabetes  eye disease, vision problems  G6PD deficiency  heart disease  history of irregular heartbeat  if you often drink alcohol  kidney disease  liver disease  porphyria  psoriasis  an unusual or allergic reaction to chloroquine, hydroxychloroquine, other medicines, foods, dyes, or preservatives  pregnant or trying to get pregnant  breast-feeding How should I use this medicine? Take this medicine by mouth with a glass of water. Take it as directed on the prescription label. Do not cut, crush or chew this medicine. Swallow the tablets whole. Take it with food. Do not take it more than directed. Take all of this medicine unless your health care provider tells you to stop it early. Keep taking it even if you think you are better. Take products with antacids in them at a different time of day than this medicine. Take this medicine 4 hours before or 4 hours after antacids. Talk to your health care provider if you have questions. Talk to your pediatrician regarding the use of this medicine in children. While this drug may be prescribed for selected conditions, precautions do apply. Overdosage: If you think you have taken too much of this medicine contact a poison control center or emergency room at once. NOTE: This medicine is only for you. Do not share this medicine with others. What if I miss a dose? If you miss a dose, take it as soon as you can. If it is almost time for your next dose, take only  that dose. Do not take double or extra doses. What may interact with this medicine? Do not take this medicine with any of the following medications:  cisapride  dronedarone  pimozide  thioridazine This medicine may also interact with the following medications:  ampicillin  antacids  cimetidine  cyclosporine  digoxin  kaolin  medicines for diabetes, like insulin, glipizide, glyburide  medicines for seizures like carbamazepine, phenobarbital, phenytoin  mefloquine  methotrexate  other medicines that prolong the QT interval (cause an abnormal heart rhythm)  praziquantel This list may not describe all possible interactions. Give your health care provider a list of all the medicines, herbs, non-prescription drugs, or dietary supplements you use. Also tell them if you smoke, drink alcohol, or use illegal drugs. Some items may interact with your medicine. What should I watch for while using this medicine? Visit your health care provider for regular checks on your progress. Tell your health care provider if your symptoms do not start to get better or if they get worse. You may need blood work done while you are taking this medicine. If you take other medicines that can affect heart rhythm, you may need more testing. Talk to your health care provider if you have questions. Your vision may be tested before and during use of this medicine. Tell your health care provider right away if you have any change in your eyesight. This medicine may cause serious skin reactions. They can happen weeks to months after starting the medicine. Contact your health care provider right away if you  notice fevers or flu-like symptoms with a rash. The rash may be red or purple and then turn into blisters or peeling of the skin. Or, you might notice a red rash with swelling of the face, lips or lymph nodes in your neck or under your arms. If you or your family notice any changes in your behavior, such as new  or worsening depression, thoughts of harming yourself, anxiety, or other unusual or disturbing thoughts, or memory loss, call your health care provider right away. What side effects may I notice from receiving this medicine? Side effects that you should report to your doctor or health care professional as soon as possible:  allergic reactions (skin rash, itching or hives; swelling of the face, lips, or tongue)  changes in vision  decreased hearing, ringing in the ears  heartbeat rhythm changes (trouble breathing; chest pain; dizziness; fast, irregular heartbeat; feeling faint or lightheaded, falls)  liver injury (dark yellow or brown urine; general ill feeling or flu-like symptoms; loss of appetite, right upper belly pain; unusually weak or tired, yellowing of the eyes or skin)  low blood sugar (feeling anxious; confusion; dizziness; increased hunger; unusually weak or tired; increased sweating; shakiness; cold, clammy skin; irritable; headache; blurred vision; fast heartbeat; loss of consciousness)  low red blood cell counts (trouble breathing; feeling faint; lightheaded, falls; unusually weak or tired)  muscle weakness  pain, tingling, numbness in the hands or feet  rash, fever, and swollen lymph nodes  redness, blistering, peeling or loosening of the skin, including inside the mouth  suicidal thoughts, mood changes  uncontrollable head, mouth, neck, arm, or leg movements  unusual bruising or bleeding Side effects that usually do not require medical attention (report to your doctor or health care professional if they continue or are bothersome):  diarrhea  hair loss  irritable This list may not describe all possible side effects. Call your doctor for medical advice about side effects. You may report side effects to FDA at 1-800-FDA-1088. Where should I keep my medicine? Keep out of the reach of children and pets. Store at room temperature up to 30 degrees C (86 degrees F).  Protect from light. Get rid of any unused medicine after the expiration date. To get rid of medicines that are no longer needed or have expired:  Take the medicine to a medicine take-back program. Check with your pharmacy or law enforcement to find a location.  If you cannot return the medicine, check the label or package insert to see if the medicine should be thrown out in the garbage or flushed down the toilet. If you are not sure, ask your health care provider. If it is safe to put it in the trash, empty the medicine out of the container. Mix the medicine with cat litter, dirt, coffee grounds, or other unwanted substance. Seal the mixture in a bag or container. Put it in the trash.  Prednisone tablets What is this medicine? PREDNISONE (PRED ni sone) is a corticosteroid. It is commonly used to treat inflammation of the skin, joints, lungs, and other organs. Common conditions treated include asthma, allergies, and arthritis. It is also used for other conditions, such as blood disorders and diseases of the adrenal glands. This medicine may be used for other purposes; ask your health care provider or pharmacist if you have questions. COMMON BRAND NAME(S): Deltasone, Predone, Sterapred, Sterapred DS What should I tell my health care provider before I take this medicine? They need to know if you have any  of these conditions:  Cushing's syndrome  diabetes  glaucoma  heart disease  high blood pressure  infection (especially a virus infection such as chickenpox, cold sores, or herpes)  kidney disease  liver disease  mental illness  myasthenia gravis  osteoporosis  seizures  stomach or intestine problems  thyroid disease  an unusual or allergic reaction to lactose, prednisone, other medicines, foods, dyes, or preservatives  pregnant or trying to get pregnant  breast-feeding How should I use this medicine? Take this medicine by mouth with a glass of water. Follow the  directions on the prescription label. Take this medicine with food. If you are taking this medicine once a day, take it in the morning. Do not take more medicine than you are told to take. Do not suddenly stop taking your medicine because you may develop a severe reaction. Your doctor will tell you how much medicine to take. If your doctor wants you to stop the medicine, the dose may be slowly lowered over time to avoid any side effects. Talk to your pediatrician regarding the use of this medicine in children. Special care may be needed. Overdosage: If you think you have taken too much of this medicine contact a poison control center or emergency room at once. NOTE: This medicine is only for you. Do not share this medicine with others. What if I miss a dose? If you miss a dose, take it as soon as you can. If it is almost time for your next dose, talk to your doctor or health care professional. You may need to miss a dose or take an extra dose. Do not take double or extra doses without advice. What may interact with this medicine? Do not take this medicine with any of the following medications:  metyrapone  mifepristone This medicine may also interact with the following medications:  aminoglutethimide  amphotericin B  aspirin and aspirin-like medicines  barbiturates  certain medicines for diabetes, like glipizide or glyburide  cholestyramine  cholinesterase inhibitors  cyclosporine  digoxin  diuretics  ephedrine  female hormones, like estrogens and birth control pills  isoniazid  ketoconazole  NSAIDS, medicines for pain and inflammation, like ibuprofen or naproxen  phenytoin  rifampin  toxoids  vaccines  warfarin This list may not describe all possible interactions. Give your health care provider a list of all the medicines, herbs, non-prescription drugs, or dietary supplements you use. Also tell them if you smoke, drink alcohol, or use illegal drugs. Some items  may interact with your medicine. What should I watch for while using this medicine? Visit your doctor or health care professional for regular checks on your progress. If you are taking this medicine over a prolonged period, carry an identification card with your name and address, the type and dose of your medicine, and your doctor's name and address. This medicine may increase your risk of getting an infection. Tell your doctor or health care professional if you are around anyone with measles or chickenpox, or if you develop sores or blisters that do not heal properly. If you are going to have surgery, tell your doctor or health care professional that you have taken this medicine within the last twelve months. Ask your doctor or health care professional about your diet. You may need to lower the amount of salt you eat. This medicine may increase blood sugar. Ask your healthcare provider if changes in diet or medicines are needed if you have diabetes. What side effects may I notice from receiving  this medicine? Side effects that you should report to your doctor or health care professional as soon as possible:  allergic reactions like skin rash, itching or hives, swelling of the face, lips, or tongue  changes in emotions or moods  changes in vision  depressed mood  eye pain  fever or chills, cough, sore throat, pain or difficulty passing urine  signs and symptoms of high blood sugar such as being more thirsty or hungry or having to urinate more than normal. You may also feel very tired or have blurry vision.  swelling of ankles, feet Side effects that usually do not require medical attention (report to your doctor or health care professional if they continue or are bothersome):  confusion, excitement, restlessness  headache  nausea, vomiting  skin problems, acne, thin and shiny skin  trouble sleeping  weight gain This list may not describe all possible side effects. Call your  doctor for medical advice about side effects. You may report side effects to FDA at 1-800-FDA-1088. Where should I keep my medicine? Keep out of the reach of children. Store at room temperature between 15 and 30 degrees C (59 and 86 degrees F). Protect from light. Keep container tightly closed. Throw away any unused medicine after the expiration date. NOTE: This sheet is a summary. It may not cover all possible information. If you have questions about this medicine, talk to your doctor, pharmacist, or health care provider.  2021 [employer] (2017-11-25 10:54:22)

## 2020-08-25 ENCOUNTER — Ambulatory Visit: Payer: PPO | Admitting: Internal Medicine

## 2020-09-18 ENCOUNTER — Ambulatory Visit: Payer: PPO | Admitting: Family Medicine

## 2020-09-25 ENCOUNTER — Ambulatory Visit (HOSPITAL_BASED_OUTPATIENT_CLINIC_OR_DEPARTMENT_OTHER)
Admission: RE | Admit: 2020-09-25 | Discharge: 2020-09-25 | Disposition: A | Discharge status: Home / Self Care | Payer: PPO | Source: Ambulatory Visit | Attending: Family Medicine | Admitting: Family Medicine

## 2020-09-25 ENCOUNTER — Encounter: Payer: Self-pay | Admitting: Family Medicine

## 2020-09-25 ENCOUNTER — Other Ambulatory Visit: Payer: Self-pay

## 2020-09-25 ENCOUNTER — Ambulatory Visit (INDEPENDENT_AMBULATORY_CARE_PROVIDER_SITE_OTHER): Payer: PPO | Admitting: Family Medicine

## 2020-09-25 VITALS — BP 124/84 | HR 77 | Temp 97.9°F | Resp 18 | Ht 63.5 in | Wt 161.4 lb

## 2020-09-25 DIAGNOSIS — R0602 Shortness of breath: Secondary | ICD-10-CM | POA: Insufficient documentation

## 2020-09-25 DIAGNOSIS — E785 Hyperlipidemia, unspecified: Secondary | ICD-10-CM | POA: Diagnosis not present

## 2020-09-25 DIAGNOSIS — M1990 Unspecified osteoarthritis, unspecified site: Secondary | ICD-10-CM | POA: Diagnosis not present

## 2020-09-25 DIAGNOSIS — E538 Deficiency of other specified B group vitamins: Secondary | ICD-10-CM

## 2020-09-25 DIAGNOSIS — E559 Vitamin D deficiency, unspecified: Secondary | ICD-10-CM | POA: Diagnosis not present

## 2020-09-25 LAB — COMPREHENSIVE METABOLIC PANEL WITH GFR
ALT: 14 U/L (ref 0–35)
AST: 15 U/L (ref 0–37)
Albumin: 4.3 g/dL (ref 3.5–5.2)
Alkaline Phosphatase: 66 U/L (ref 39–117)
BUN: 18 mg/dL (ref 6–23)
CO2: 25 meq/L (ref 19–32)
Calcium: 9.3 mg/dL (ref 8.4–10.5)
Chloride: 103 meq/L (ref 96–112)
Creatinine, Ser: 0.91 mg/dL (ref 0.40–1.20)
GFR: 64.81 mL/min
Glucose, Bld: 89 mg/dL (ref 70–99)
Potassium: 4.7 meq/L (ref 3.5–5.1)
Sodium: 140 meq/L (ref 135–145)
Total Bilirubin: 0.6 mg/dL (ref 0.2–1.2)
Total Protein: 6.6 g/dL (ref 6.0–8.3)

## 2020-09-25 LAB — LIPID PANEL
Cholesterol: 213 mg/dL — ABNORMAL HIGH (ref 0–200)
HDL: 104.2 mg/dL
LDL Cholesterol: 93 mg/dL (ref 0–99)
NonHDL: 108.97
Total CHOL/HDL Ratio: 2
Triglycerides: 78 mg/dL (ref 0.0–149.0)
VLDL: 15.6 mg/dL (ref 0.0–40.0)

## 2020-09-25 LAB — VITAMIN D 25 HYDROXY (VIT D DEFICIENCY, FRACTURES): VITD: 34.79 ng/mL (ref 30.00–100.00)

## 2020-09-25 LAB — VITAMIN B12: Vitamin B-12: 918 pg/mL — ABNORMAL HIGH (ref 211–911)

## 2020-09-25 NOTE — Assessment & Plan Note (Signed)
Tolerating statin, encouraged heart healthy diet, avoid trans fats, minimize simple carbs and saturated fats. Increase exercise as tolerated 

## 2020-09-25 NOTE — Assessment & Plan Note (Signed)
Per rheum On pred and plaquenil ? Lupus

## 2020-09-25 NOTE — Patient Instructions (Signed)

## 2020-09-25 NOTE — Progress Notes (Addendum)
Patient ID: Stephanie [name], female    DOB: Jul 12, 1950  Age: 70 y.o. MRN: [id number]    Subjective:  Subjective  HPI [name] presents for an office visit and f/u for HLD, fatigue, and dizziness today.  She complains of intermittent SOB x 3 months and bilateral wrist pain. She notes that 10 mg of prednisone POD help relieved the pain. Pt was referred to [facility] and dx with possible late onset lupus. 07/11/2020, lab test showed low positive antibodies for lupus, rheumatoid arthritis test is negative. Hand x-ray indicated osteoarthritis. She states that her husband notice that she has SOB when she is sedentary (sitting), however she notes that she doesn't have SOB when performing yard work.  She reports that the fatigue had improved since 06/02/2020, and she is able to perform activities more frequently.  She endorses taking 10 mg Lipitor PO QD for her dx of HLD.   No other complaints Lab Results  Component Value Date   CHOL 262 (H) 06/02/2020   CHOL 252 (H) 01/04/2020   CHOL 241 (H) 06/21/2019   Lab Results  Component Value Date   HDL 87.10 06/02/2020   HDL 81 01/04/2020   HDL 81.90 06/21/2019   Lab Results  Component Value Date   LDLCALC 162 (H) 06/02/2020   LDLCALC 155 (H) 01/04/2020   LDLCALC 137 (H) 06/21/2019   Lab Results  Component Value Date   TRIG 65.0 06/02/2020   TRIG 66 01/04/2020   TRIG 109.0 06/21/2019   Lab Results  Component Value Date   CHOLHDL 3 06/02/2020   CHOLHDL 3.1 01/04/2020   CHOLHDL 3 06/21/2019   Lab Results  Component Value Date   LDLDIRECT 103.2 10/15/2011   LDLDIRECT 143.4 11/06/2010   LDLDIRECT 123.9 04/23/2007   She notes that she no longer needs B-12, however she is currently taking 1.25 mg DRISDOL ever 7 days.  She denies any chest pain, fever, abdominal pain, cough, chills, sore throat, dysuria, urinary incontinence, back pain, HA, or N/V/D at this time.   Review of Systems  Constitutional:  Negative for chills, fatigue and fever.   HENT:  Negative for ear pain, rhinorrhea, sinus pressure, sinus pain, sore throat and tinnitus.   Eyes:  Negative for pain.  Respiratory:  Positive for shortness of breath. Negative for cough and wheezing.   Cardiovascular:  Negative for chest pain.  Gastrointestinal:  Negative for abdominal pain, anal bleeding, constipation, diarrhea, nausea and vomiting.  Genitourinary:  Negative for flank pain.  Musculoskeletal:  Positive for myalgias (bilateral wrist). Negative for back pain and neck pain.       (+) wrist pain bilateral     Skin:  Negative for rash.  Neurological:  Negative for seizures, weakness, light-headedness, numbness and headaches.   History Past Medical History:  Diagnosis Date   Arthritis    Cataract    beginning signs of cataract   Complication of anesthesia    Per pt, hard to wake up past c-section/ 1 time.   DDD (degenerative disc disease)    in feet and hands   GERD (gastroesophageal reflux disease)    Hx of colonic polyps 08/20/2016   Hyperlipidemia    Lactose intolerance     She has a past surgical history that includes Total abdominal hysterectomy w/ bilateral salpingoophorectomy; Colonoscopy; Cataract extraction (Bilateral, summer 2021); and Cesarean section.   Her family history includes Alzheimer's disease in her mother; Arthritis in her brother, father, mother, and sister; Cystic fibrosis in an other  family member; Dementia in her mother; Healthy in her daughter and son; Hypertension in her father and mother; Liver disease in her father, paternal aunt, paternal aunt, paternal aunt, and sister.She reports that she has never smoked. She has never used smokeless tobacco. She reports current alcohol use of about 7.0 standard drinks of alcohol per week. She reports that she does not use drugs.  Current Outpatient Medications on File Prior to Visit  Medication Sig Dispense Refill   atorvastatin (LIPITOR) 10 MG tablet Take 1 tablet (10 mg total) by mouth daily. 90  tablet 3   doxylamine, Sleep, (UNISOM) 25 MG tablet Take 25 mg by mouth at bedtime as needed.     hydroxychloroquine (PLAQUENIL) 200 MG tablet Take 1.5 tablets (300 mg total) by mouth daily. 45 tablet 2   predniSONE (DELTASONE) 10 MG tablet Take 1 tablet (10 mg total) by mouth daily with breakfast. 30 tablet 2   rOPINIRole (REQUIP) 0.5 MG tablet TAKE 2 TABLETS BY MOUTH AT BEDTIME ,  MAY  INCREASE  TO  3  TABLETS  AT  BEDTIME  IF  NEEDED 270 tablet 0   Vitamin D, Ergocalciferol, (DRISDOL) 1.25 MG (50000 UNIT) CAPS capsule Take 1 capsule (50,000 Units total) by mouth every 7 (seven) days. 12 capsule 1   Current Facility-Administered Medications on File Prior to Visit  Medication Dose Route Frequency Provider Last Rate Last Admin   0.9 %  sodium chloride infusion  500 mL Intravenous Continuous Gatha Mayer, MD       cyanocobalamin ((VITAMIN B-12)) injection 1,000 mcg  1,000 mcg Intramuscular Q30 days Carollee Herter, Kendrick Fries R, DO   1,000 mcg at 07/08/19 1511   cyanocobalamin ((VITAMIN B-12)) injection 1,000 mcg  1,000 mcg Intramuscular Q30 days Carollee Herter, Kendrick Fries R, DO   1,000 mcg at 07/15/19 1004     Objective:  Objective  Physical Exam Vitals and nursing note reviewed.  Constitutional:      General: She is not in acute distress.    Appearance: Normal appearance. She is well-developed. She is not ill-appearing.  HENT:     Head: Normocephalic and atraumatic.     Right Ear: External ear normal.     Left Ear: External ear normal.  Eyes:     General:        Right eye: No discharge.        Left eye: No discharge.     Extraocular Movements: Extraocular movements intact.  Cardiovascular:     Rate and Rhythm: Normal rate and regular rhythm.     Pulses: Normal pulses.     Heart sounds: Normal heart sounds. No murmur heard.   No friction rub. No gallop.  Pulmonary:     Effort: Pulmonary effort is normal. No respiratory distress.     Breath sounds: Normal breath sounds and air entry. No  stridor. No wheezing, rhonchi or rales.  Chest:     Chest wall: No tenderness.  Abdominal:     General: Bowel sounds are normal. There is no distension.     Palpations: Abdomen is soft. There is no mass.     Tenderness: no abdominal tenderness There is no guarding or rebound.     Hernia: No hernia is present.  Musculoskeletal:        General: Normal range of motion.     Cervical back: Normal range of motion and neck supple.     Right lower leg: No edema.     Left lower leg: No  edema.  Skin:    General: Skin is warm and dry.  Neurological:     Mental Status: She is alert and oriented to person, place, and time.  Psychiatric:        Behavior: Behavior normal.        Thought Content: Thought content normal.   BP 124/84 (BP Location: Right Arm, Patient Position: Sitting, Cuff Size: Normal)   Pulse 77   Temp 97.9 F (36.6 C) (Oral)   Resp 18   Ht 5' 3.5" (1.613 m)   Wt 161 lb 6.4 oz (73.2 kg)   SpO2 98%   BMI 28.14 kg/m  Wt Readings from Last 3 Encounters:  09/25/20 161 lb 6.4 oz (73.2 kg)  08/09/20 161 lb 6.4 oz (73.2 kg)  07/11/20 161 lb 6.4 oz (73.2 kg)     Lab Results  Component Value Date   WBC 5.0 06/02/2020   HGB 13.3 06/02/2020   HCT 39.1 06/02/2020   PLT 230.0 06/02/2020   GLUCOSE 104 (H) 01/04/2020   CHOL 262 (H) 06/02/2020   TRIG 65.0 06/02/2020   HDL 87.10 06/02/2020   LDLDIRECT 103.2 10/15/2011   LDLCALC 162 (H) 06/02/2020   ALT 12 01/04/2020   AST 16 01/04/2020   NA 141 01/04/2020   K 4.9 01/04/2020   CL 106 01/04/2020   CREATININE 0.75 01/04/2020   BUN 17 01/04/2020   CO2 26 01/04/2020   TSH 2.42 06/02/2020    ECHOCARDIOGRAM COMPLETE  Result Date: 08/09/2020    ECHOCARDIOGRAM REPORT   Patient Name:   NAJMA BOZARTH [facility] Date of Exam: 08/09/2020 Medical Rec #:  [id number]      Height:       63.0 in Accession #:    [id number]     Weight:       161.4 lb Date of Birth:  1950-03-13       BSA:          1.765 m Patient Age:    24 years       BP:            142/86 mmHg Patient Gender: F              HR:           74 bpm. Exam Location:  Outpatient Procedure: 2D Echo, Cardiac Doppler and Color Doppler Indications:    R94.31 Abnormal EKG  History:        Patient has no prior history of Echocardiogram examinations.                 Risk Factors:Dyslipidemia.  Sonographer:    Bernadene Person RDCS Referring Phys: [place]  1. Left ventricular ejection fraction, by estimation, is 60 to 65%. The left ventricle has normal function. The left ventricle has no regional wall motion abnormalities. Left ventricular diastolic parameters are consistent with Grade I diastolic dysfunction (impaired relaxation).  2. Right ventricular systolic function is normal. The right ventricular size is normal. The estimated right ventricular systolic pressure is 88.4 mmHg.  3. The mitral valve is normal in structure. Trivial mitral valve regurgitation. No evidence of mitral stenosis.  4. The aortic valve is tricuspid. Aortic valve regurgitation is not visualized. No aortic stenosis is present. Comparison(s): No prior Echocardiogram. FINDINGS  Left Ventricle: Left ventricular ejection fraction, by estimation, is 60 to 65%. The left ventricle has normal function. The left ventricle has no regional wall motion abnormalities. The left ventricular internal cavity size  was normal in size. There is  no left ventricular hypertrophy. Left ventricular diastolic parameters are consistent with Grade I diastolic dysfunction (impaired relaxation). Right Ventricle: The right ventricular size is normal. No increase in right ventricular wall thickness. Right ventricular systolic function is normal. The tricuspid regurgitant velocity is 2.40 m/s, and with an assumed right atrial pressure of 3 mmHg, the estimated right ventricular systolic pressure is 91.4 mmHg. Left Atrium: Left atrial size was normal in size. Right Atrium: Right atrial size was normal in size. Pericardium: There is no  evidence of pericardial effusion. Mitral Valve: The mitral valve is normal in structure. Trivial mitral valve regurgitation. No evidence of mitral valve stenosis. Tricuspid Valve: The tricuspid valve is normal in structure. Tricuspid valve regurgitation is trivial. Aortic Valve: The aortic valve is tricuspid. Aortic valve regurgitation is not visualized. No aortic stenosis is present. Pulmonic Valve: The pulmonic valve was not well visualized. Pulmonic valve regurgitation is not visualized. Aorta: The aortic root and ascending aorta are structurally normal, with no evidence of dilitation. IAS/Shunts: No atrial level shunt detected by color flow Doppler.  LEFT VENTRICLE PLAX 2D LVIDd:         3.80 cm  Diastology LVIDs:         2.30 cm  LV e' medial:    5.63 cm/s LV PW:         0.80 cm  LV E/e' medial:  8.9 LV IVS:        1.00 cm  LV e' lateral:   4.20 cm/s LVOT diam:     1.90 cm  LV E/e' lateral: 11.9 LV SV:         44 LV SV Index:   25 LVOT Area:     2.84 cm  RIGHT VENTRICLE RV S prime:     12.50 cm/s TAPSE (M-mode): 1.6 cm LEFT ATRIUM             Index       RIGHT ATRIUM          Index LA diam:        3.00 cm 1.70 cm/m  RA Area:     9.50 cm LA Vol (A2C):   29.9 ml 16.94 ml/m RA Volume:   17.80 ml 10.08 ml/m LA Vol (A4C):   24.7 ml 13.99 ml/m LA Biplane Vol: 27.6 ml 15.64 ml/m  AORTIC VALVE LVOT Vmax:   80.60 cm/s LVOT Vmean:  52.700 cm/s LVOT VTI:    0.155 m  AORTA Ao Root diam: 2.90 cm Ao Asc diam:  2.80 cm MITRAL VALVE               TRICUSPID VALVE MV Area (PHT): 2.11 cm    TR Peak grad:   23.0 mmHg MV Decel Time: 359 msec    TR Vmax:        240.00 cm/s MV E velocity: 50.10 cm/s MV A velocity: 69.00 cm/s  SHUNTS MV E/A ratio:  0.73        Systemic VTI:  0.16 m                            Systemic Diam: 1.90 cm Gwyndolyn Kaufman MD Electronically signed by Gwyndolyn Kaufman MD Signature Date/Time: 08/09/2020/12:07:30 PM    Final      Assessment & Plan:  Plan    No orders of the defined types were  placed in this encounter.   Problem List Items Addressed  This Visit       Unprioritized   Hyperlipidemia - Primary    Tolerating statin, encouraged heart healthy diet, avoid trans fats, minimize simple carbs and saturated fats. Increase exercise as tolerated       Relevant Orders   Lipid panel   Comprehensive metabolic panel   Inflammatory arthritis    Per rheum On pred and plaquenil ? Lupus         SOB (shortness of breath)    Mild diastolic dysfunction on echo Check cxr  ? Cardiology referral if symptoms persist and cxr normal        Relevant Orders   DG Chest 2 View   Other Visit Diagnoses     Vitamin D deficiency       Relevant Orders   Vitamin B12   VITAMIN D 25 Hydroxy (Vit-D Deficiency, Fractures)   Vitamin B12 deficiency       Relevant Orders   Vitamin B12   VITAMIN D 25 Hydroxy (Vit-D Deficiency, Fractures)       Follow-up: Return in about 6 months (around 03/28/2021) for annual exam, fasting.   I,Gordon Zheng,acting as a Education administrator for [employer], DO.,have documented all relevant documentation on the behalf of [name], DO,as directed by  [name], DO while in the presence of [place], DO, have reviewed all documentation for this visit. The documentation on 09/25/20 for the exam, diagnosis, procedures, and orders are all accurate and complete.

## 2020-09-25 NOTE — Assessment & Plan Note (Signed)
Mild diastolic dysfunction on echo Check cxr  ? Cardiology referral if symptoms persist and cxr normal

## 2020-10-04 ENCOUNTER — Other Ambulatory Visit: Payer: Self-pay | Admitting: Family Medicine

## 2020-10-04 ENCOUNTER — Ambulatory Visit: Payer: PPO | Admitting: Internal Medicine

## 2020-10-04 ENCOUNTER — Encounter: Payer: Self-pay | Admitting: Internal Medicine

## 2020-10-04 ENCOUNTER — Other Ambulatory Visit: Payer: Self-pay

## 2020-10-04 VITALS — BP 129/85 | HR 87 | Resp 12 | Ht 63.5 in | Wt 163.4 lb

## 2020-10-04 DIAGNOSIS — M1991 Primary osteoarthritis, unspecified site: Secondary | ICD-10-CM | POA: Diagnosis not present

## 2020-10-04 DIAGNOSIS — R768 Other specified abnormal immunological findings in serum: Secondary | ICD-10-CM | POA: Diagnosis not present

## 2020-10-04 DIAGNOSIS — R0602 Shortness of breath: Secondary | ICD-10-CM

## 2020-10-04 DIAGNOSIS — G2581 Restless legs syndrome: Secondary | ICD-10-CM

## 2020-10-04 DIAGNOSIS — M1990 Unspecified osteoarthritis, unspecified site: Secondary | ICD-10-CM | POA: Diagnosis not present

## 2020-10-04 MED ORDER — PREDNISONE 5 MG PO TABS: 5.0000 mg | ORAL_TABLET | Freq: Every day | ORAL | 1 refills | Status: DC

## 2020-10-04 NOTE — Progress Notes (Signed)
Office Visit Note  Patient: Stephanie [name]             Date of Birth: 10/12/1950           MRN: [id number]             PCP: [name], DO Referring: [name], * Visit Date: 10/04/2020   Subjective:  Shortness of Breath and Alopecia   History of Present Illness: Stephanie [name] is a 70 y.o. female here for follow up for inflammatory arthritis due to connective tissue disease and osteoarthritis currently on hydroxychloroquine 300 mg p.o. daily and prednisone 10 mg p.o. daily.  Since her last visit she notices significant improvement in hand pain and swelling.  She has noticed some generalized hair thinning not in any specific distribution not associated with any scalp rashes itching or pain.  She reports a intermittent shortness of breath sensation that occurs both with activity and at rest.  She describes noticing her heart fluttering or feeling different in her chest periodically that has not been typical for her in the past.  She went for recent chest x-ray that was normal and echocardiogram last month that was also normal.  She was just stung on the left hand by a bee yesterday.   08/09/20 Stephanie [name] is a 70 y.o. female here for follow up for joint pains in bilateral wrists after initial visit and starting trial of prednisone 10 mg daily she feels this has helped but not completely improving symptoms. Besides joint pain she does report fatigue and has had difficulty sleeping although this predates and she does not attribute to the current arthritis symptoms.  07/11/20 Stephanie [name] is a 70 y.o. female here for evaluation of positive ANA with fatigue and joint pain symptoms. She has increased pain in her bilateral hands and wrists for about 6 months with no known preceding health events or changes. She hs a prior history of osteoarthritis chronically affecting her fingers especially base of the thumb but not this current problem. She has previously experienced carpal  tunnel syndrome when working with sewing but does not think this is similar. She notices improvement with NSAIDs but she cannot tolerate taking high doses of these or multiple times per day due to abdominal and epigastric pain and diarrhea. She does not notice much swelling, warmth or discoloration of the affected areas. Workup in primary care office demonstrated a positive ANA and negative RF test. She denies noticing other new health problems such as hair loss, oral ulcers, lymphadenopathy, raynaud's phenomenon, rashes, or fevers.    Labs reviewed 05/2020 ANA 1:160 centromere RF neg Vit D 21.92   DXA 04/2017 Result reviewed, shows osteopenia   No Rheumatology ROS completed.   PMFS History:  Patient Active Problem List   Diagnosis Date Noted   SOB (shortness of breath) 09/25/2020   Inflammatory arthritis 08/09/2020   Positive ANA (antinuclear antibody) 07/11/2020   Bilateral wrist pain 07/11/2020   Fatigue 06/02/2020   Dizziness 06/02/2020   Arthralgia 06/02/2020   Palpitations 06/02/2020   Basal cell carcinoma (BCC) of forehead 04/24/2020   Lactose intolerance    Snoring 05/16/2018   OSA (obstructive sleep apnea) 05/16/2018   Restless legs 01/26/2018   Insomnia 01/26/2018   Snores 01/26/2018   Hx of colonic polyps 08/20/2016   BACK PAIN 02/29/2008   Hyperlipidemia 04/23/2007   Osteoarthritis 04/23/2007   [place] DISEASE, LUMBAR 04/23/2007    Past Medical History:  Diagnosis  Date   Arthritis    Cataract    beginning signs of cataract   Complication of anesthesia    Per pt, hard to wake up past c-section/ 1 time.   DDD (degenerative disc disease)    in feet and hands   GERD (gastroesophageal reflux disease)    Hx of colonic polyps 08/20/2016   Hyperlipidemia    Lactose intolerance     Family History  Problem Relation Age of Onset   Hypertension Mother    Dementia Mother    Arthritis Mother    Alzheimer's disease Mother    Arthritis Father    Liver disease Father         NASH   Hypertension Father    Arthritis Sister    Liver disease Sister    Arthritis Brother    Liver disease Paternal Aunt    Liver disease Paternal Aunt    Liver disease Paternal Aunt    Cystic fibrosis Other        niece   Healthy Son    Healthy Daughter    Past Surgical History:  Procedure Laterality Date   CATARACT EXTRACTION Bilateral summer 2021   [facility]   [place]   COLONOSCOPY     TOTAL ABDOMINAL HYSTERECTOMY W/ BILATERAL SALPINGOOPHORECTOMY     Social History   Social History Narrative   Married, 1 son and 1 daughter   Self-employed   2 caffienated beverages/day   Immunization History  Administered Date(s) Administered   Influenza, High Dose Seasonal PF 01/23/2018   Influenza,inj,Quad PF,6+ Mos 12/08/2012   Influenza-Unspecified 12/31/2019   PFIZER(Purple Top)SARS-COV-2 Vaccination 04/16/2019, 05/12/2019, 12/31/2019   Pneumococcal Conjugate-13 05/06/2017   Pneumococcal Polysaccharide-23 01/04/2020   Td 06/28/2004   Tdap 05/12/2012   Zoster, Live 05/12/2012     Objective: Vital Signs: BP 129/85 (BP Location: Right Arm, Patient Position: Sitting, Cuff Size: Large)   Pulse 87   Resp 12   Ht 5' 3.5" (1.613 m)   Wt 163 lb 6.4 oz (74.1 kg)   BMI 28.49 kg/m    Physical Exam Cardiovascular:     Rate and Rhythm: Normal rate and regular rhythm.  Pulmonary:     Effort: Pulmonary effort is normal.     Breath sounds: Normal breath sounds.  Skin:    General: Skin is warm and dry.     Findings: No rash.  Neurological:     Mental Status: She is alert.  Psychiatric:        Mood and Affect: Mood normal.     Musculoskeletal Exam:  Shoulders full ROM no tenderness or swelling Elbows full ROM no tenderness or swelling Right wrist and fingers with full range of motion no tenderness to palpation chronic joint hypertrophy with ulnar deviation of MCPs and some rotation deviation of DIPs with bony nodules Left wrist and hand is  acutely swollen with nonpitting edema tenderness and decreased range of motion Knees full ROM no tenderness or swelling Ankles full ROM no tenderness or swelling   Investigation: No additional findings.  Imaging: DG Chest 2 View  Result Date: 09/25/2020 CLINICAL DATA:  Shortness of breath for 6 months EXAM: CHEST - 2 VIEW COMPARISON:  None FINDINGS: Normal heart size, mediastinal contours, and pulmonary vascularity. Lungs clear. No pulmonary infiltrate, pleural effusion, or pneumothorax. Bones demineralized. IMPRESSION: No acute abnormalities. Electronically Signed   By: Lavonia Dana M.D.   On: 09/25/2020 15:51    Recent Labs: Lab Results  Component Value Date   WBC 5.0 06/02/2020   HGB 13.3 06/02/2020   PLT 230.0 06/02/2020   NA 140 09/25/2020   K 4.7 09/25/2020   CL 103 09/25/2020   CO2 25 09/25/2020   GLUCOSE 89 09/25/2020   BUN 18 09/25/2020   CREATININE 0.91 09/25/2020   BILITOT 0.6 09/25/2020   ALKPHOS 66 09/25/2020   AST 15 09/25/2020   ALT 14 09/25/2020   PROT 6.6 09/25/2020   ALBUMIN 4.3 09/25/2020   CALCIUM 9.3 09/25/2020    Speciality Comments: No specialty comments available.  Procedures:  No procedures performed Allergies: Penicillins   Assessment / Plan:     Visit Diagnoses: Positive ANA (antinuclear antibody)  Inflammatory arthritis - Plan: Anti-DNA antibody, double-stranded, C3 and C4, CK, Sedimentation rate, Ambulatory referral to Cardiology  Positive ANA with mixed antibodies dsDNA and centromere with previous joint swelling currently only swelling is around her bee sting site but this is on 10 mg prednisone.  Recommend tapering down to 5 mg dose while continuing hydroxychloroquine 300 mg p.o. daily.  We will check dsDNA and serum complements and sedimentation rate for activity markers.  She has a question over possible contribution of statin to symptoms both joints and shortness of breath also check CK level today.  Primary osteoarthritis, unspecified  site  Generalized osteoarthritis unchanged symptoms may be currently suppressed with the oral prednisone so have to see if she tolerates reduction and discontinuation of this.  SOB (shortness of breath) - Plan: Ambulatory referral to Cardiology  Persistent shortness of breath for more than a month now she also describes what sounds like palpitations intermittently.  I am suspicious for paroxysmal arrhythmia based on this description and her normal work-up to date including chest x-ray echocardiogram and EKG.  Interval treatment with prednisone may contribute to the frequency or severity or tachycardia and arrhythmia so decreasing this.  Refer to cardiology to evaluate.  Orders: Orders Placed This Encounter  Procedures   Anti-DNA antibody, double-stranded   C3 and C4   CK   Sedimentation rate   Ambulatory referral to Cardiology    Meds ordered this encounter  Medications   predniSONE (DELTASONE) 5 MG tablet    Sig: Take 1 tablet (5 mg total) by mouth daily with breakfast.    Dispense:  30 tablet    Refill:  1      Follow-Up Instructions: Return in about 3 months (around 01/04/2021) for UCTD HCQ+GCs taper f/u 42mo.   CCollier Salina MD  Note - This record has been created using [employer]  Chart creation errors have been sought, but may not always  have been located. Such creation errors do not reflect on  the standard of medical care.

## 2020-10-05 LAB — ANTI-DNA ANTIBODY, DOUBLE-STRANDED: ds DNA Ab: 2 IU/mL

## 2020-10-05 LAB — SEDIMENTATION RATE: Sed Rate: 6 mm/h (ref 0–30)

## 2020-10-05 LAB — C3 AND C4
C3 Complement: 167 mg/dL (ref 83–193)
C4 Complement: 39 mg/dL (ref 15–57)

## 2020-10-05 LAB — CK: Total CK: 59 U/L (ref 29–143)

## 2020-10-10 NOTE — Progress Notes (Signed)
Blood tests look fine dsDNA is now negative and sedimentation rate is normal. CK level is normal making soreness or weakness related to statin use less likely.

## 2020-11-04 ENCOUNTER — Other Ambulatory Visit: Payer: Self-pay | Admitting: Internal Medicine

## 2020-11-04 DIAGNOSIS — M1990 Unspecified osteoarthritis, unspecified site: Secondary | ICD-10-CM

## 2020-12-12 ENCOUNTER — Other Ambulatory Visit: Payer: Self-pay

## 2020-12-12 ENCOUNTER — Encounter (HOSPITAL_BASED_OUTPATIENT_CLINIC_OR_DEPARTMENT_OTHER): Payer: Self-pay

## 2020-12-12 ENCOUNTER — Other Ambulatory Visit (HOSPITAL_BASED_OUTPATIENT_CLINIC_OR_DEPARTMENT_OTHER): Payer: Self-pay | Admitting: *Deleted

## 2020-12-12 ENCOUNTER — Ambulatory Visit (HOSPITAL_BASED_OUTPATIENT_CLINIC_OR_DEPARTMENT_OTHER): Payer: PPO | Admitting: Cardiology

## 2020-12-12 VITALS — BP 146/82 | HR 76 | Ht 63.0 in | Wt 164.7 lb

## 2020-12-12 DIAGNOSIS — R768 Other specified abnormal immunological findings in serum: Secondary | ICD-10-CM

## 2020-12-12 DIAGNOSIS — R0602 Shortness of breath: Secondary | ICD-10-CM

## 2020-12-12 DIAGNOSIS — Z01812 Encounter for preprocedural laboratory examination: Secondary | ICD-10-CM | POA: Diagnosis not present

## 2020-12-12 DIAGNOSIS — E7800 Pure hypercholesterolemia, unspecified: Secondary | ICD-10-CM

## 2020-12-12 MED ORDER — METOPROLOL TARTRATE 100 MG PO TABS: 100.0000 mg | ORAL_TABLET | Freq: Once | ORAL | 0 refills | Status: DC

## 2020-12-12 NOTE — Progress Notes (Signed)
Cardiology Office Note:    Date:  12/12/2020   ID:  Stephanie [name], DOB Oct 09, 1950, MRN [id number]  PCP:  Carollee Herter, Alferd Apa, DO   [facility] Providers Cardiologist:  None     Referring MD: Collier Salina, MD   History of Present Illness:    Stephanie [name] is a 70 y.o. female here for the evaluation of shortness of breath and +ANA at the request of Dr. Benjamine Mola.  She followed up with Dr. Benjamine Mola 10/04/2020 and had reported intermittent shortness of breath occurring with activity and rest, persisting for over a month now. She also noticed atypical heart fluttering. Recent CXR and Echo were normal. Interval treatment with prednisone was reduced and she was referred to cardiology.  Today: She reports her shortness of breath occurs while walking around her home, or with over-exertion. Her husband first brought this symptom to her attention about 6 months ago. At one point she tried stopping her atorvastatin to see if this caused her shortness of breath, but was unable to determine any correlation.  Twice, she woke up with a racing heart rate. After drinking a plain glass of water, she also notices a "weird" heart rate, feeling like it speeds up. Water with alka-seltzer produces a less severe sensation. She also tried drinking beer, and this did not cause elevated heart rates.  She denies any chest pain. No lightheadedness, headaches, syncope, orthopnea, or PND. Also has no lower extremity edema.  She has never been a smoker.  Her mother had hypertension and hyperlipidemia.   Past Medical History:  Diagnosis Date   Arthritis    Cataract    beginning signs of cataract   Complication of anesthesia    Per pt, hard to wake up past c-section/ 1 time.   DDD (degenerative disc disease)    in feet and hands   GERD (gastroesophageal reflux disease)    Hx of colonic polyps 08/20/2016   Hyperlipidemia    Lactose intolerance     Past Surgical History:  Procedure Laterality Date    CATARACT EXTRACTION Bilateral summer 2021   hecker   [place] W/ BILATERAL SALPINGOOPHORECTOMY      Current Medications: Current Meds  Medication Sig   atorvastatin (LIPITOR) 10 MG tablet Take 1 tablet (10 mg total) by mouth daily.   doxylamine, Sleep, (UNISOM) 25 MG tablet Take 25 mg by mouth at bedtime as needed.   hydroxychloroquine (PLAQUENIL) 200 MG tablet TAKE 1 & 1/2 (ONE & ONE-HALF) TABLETS BY MOUTH ONCE DAILY   predniSONE (DELTASONE) 5 MG tablet Take 1 tablet (5 mg total) by mouth daily with breakfast.   rOPINIRole (REQUIP) 0.5 MG tablet TAKE 2 TABLETS BY MOUTH AT BEDTIME ,  MAY  INCREASE  TO  3  TABLETS  AT  BEDTIME  IF  NEEDED   Vitamin D, Ergocalciferol, (DRISDOL) 1.25 MG (50000 UNIT) CAPS capsule Take 1 capsule (50,000 Units total) by mouth every 7 (seven) days.   Current Facility-Administered Medications for the 12/12/20 encounter (Office Visit) with Jerline Pain, MD  Medication   0.9 %  sodium chloride infusion   cyanocobalamin ((VITAMIN B-12)) injection 1,000 mcg   cyanocobalamin ((VITAMIN B-12)) injection 1,000 mcg     Allergies:   Penicillins   Social History   Socioeconomic History   Marital status: Married    Spouse name: Not on file   Number of  children: 2   Years of education: Not on file   Highest education level: Not on file  Occupational History   Occupation: self  Tobacco Use   Smoking status: Never   Smokeless tobacco: Never  Vaping Use   Vaping Use: Never used  Substance and Sexual Activity   Alcohol use: Yes    Alcohol/week: 7.0 standard drinks    Types: 7 Standard drinks or equivalent per week   Drug use: No   Sexual activity: Yes    Partners: Male  Other Topics Concern   Not on file  Social History Narrative   Married, 1 son and 1 daughter   Self-employed   2 caffienated beverages/day   Social Determinants of Health   Financial Resource Strain: Not on file   Food Insecurity: Not on file  Transportation Needs: Not on file  Physical Activity: Not on file  Stress: Not on file  Social Connections: Not on file     Family History: The patient's family history includes Alzheimer's disease in her mother; Arthritis in her brother, father, mother, and sister; Cystic fibrosis in an other family member; Dementia in her mother; Healthy in her daughter and son; Hypertension in her father and mother; Liver disease in her father, paternal aunt, paternal aunt, paternal aunt, and sister.  ROS:   Please see the history of present illness.    (+) Shortness of breath (+) Palpitations All other systems reviewed and are negative.  EKGs/Labs/Other Studies Reviewed:    The following studies were reviewed today:  Echo 08/2020: 1. Left ventricular ejection fraction, by estimation, is 60 to 65%. The  left ventricle has normal function. The left ventricle has no regional  wall motion abnormalities. Left ventricular diastolic parameters are  consistent with Grade I diastolic  dysfunction (impaired relaxation).   2. Right ventricular systolic function is normal. The right ventricular  size is normal. The estimated right ventricular systolic pressure is 16.1  mmHg.   3. The mitral valve is normal in structure. Trivial mitral valve  regurgitation. No evidence of mitral stenosis.   4. The aortic valve is tricuspid. Aortic valve regurgitation is not  visualized. No aortic stenosis is present.   Comparison(s): No prior Echocardiogram.   EKG:  EKG is personally reviewed and interpreted. 12/12/2020: Sinus rhythm. Rate 76 bpm. Nonspecific T wave changes.  Recent Labs: 06/02/2020: Hemoglobin 13.3; Platelets 230.0; TSH 2.42 09/25/2020: ALT 14; BUN 18; Creatinine, Ser 0.91; Potassium 4.7; Sodium 140   Recent Lipid Panel    Component Value Date/Time   CHOL 213 (H) 09/25/2020 1016   TRIG 78.0 09/25/2020 1016   HDL 104.20 09/25/2020 1016   CHOLHDL 2 09/25/2020 1016    VLDL 15.6 09/25/2020 1016   LDLCALC 93 09/25/2020 1016   LDLCALC 155 (H) 01/04/2020 1001   LDLDIRECT 103.2 10/15/2011 0851     Risk Assessment/Calculations:          Physical Exam:    VS:  BP (!) 146/82 (BP Location: Right Arm, Patient Position: Sitting, Cuff Size: Normal)   Pulse 76   Ht 5\' 3"  (1.6 m)   Wt 164 lb 11.2 oz (74.7 kg)   BMI 29.18 kg/m     Wt Readings from Last 3 Encounters:  12/12/20 164 lb 11.2 oz (74.7 kg)  10/04/20 163 lb 6.4 oz (74.1 kg)  09/25/20 161 lb 6.4 oz (73.2 kg)     GEN: Well nourished, well developed in no acute distress HEENT: Normal NECK: No JVD; No carotid bruits  LYMPHATICS: No lymphadenopathy CARDIAC: RRR, no murmurs, rubs, gallops RESPIRATORY:  Clear to auscultation without rales, wheezing or rhonchi  ABDOMEN: Soft, non-tender, non-distended MUSCULOSKELETAL:  No edema; No deformity  SKIN: Warm and dry NEUROLOGIC:  Alert and oriented x 3 PSYCHIATRIC:  Normal affect   ASSESSMENT:    1. Pre-procedure lab exam   2. SOB (shortness of breath)   3. Positive ANA (antinuclear antibody)   4. Pure hypercholesterolemia    PLAN:    In order of problems listed above: SOB (shortness of breath) Her dyspnea on exertion upon fairly minimal exertion may be an anginal equivalent her underlying diagnosis of lupus would increase her overall cardiovascular risk.  She also has hyperlipidemia for which she is taking atorvastatin 10 mg daily. -I will go ahead and check a coronary CT scan with possible FFR analysis for further evaluation of her coronary arteries.  Her echocardiogram was personally reviewed and showed normal pump function.  Excellent.  No significant valvular abnormalities.  No anemia.  Normal liver function.  Normal renal function.  Normal TSH.  Chest x-ray also personally reviewed shows no abnormalities.  Positive ANA (antinuclear antibody) Currently being treated for potential lupus with prednisone and  hydroxychloroquine  Hyperlipidemia Last LDL 91.  She is on atorvastatin 10 mg once a day.  When she for started atorvastatin, she stated that her bones ache, her shins ache for instance.  After stopping it, she did feel little bit better but her cholesterol did go up.  She has restarted not had any more of these achy sensations.  Excellent.  Expressed the importance of statin therapy.     We will follow-up with results of CT scan.  If unremarkable, would recommend potential pulmonary consultation/evaluation.    Follow-up: PRN.  Medication Adjustments/Labs and Tests Ordered: Current medicines are reviewed at length with the patient today.  Concerns regarding medicines are outlined above.  Orders Placed This Encounter  Procedures   CT CORONARY MORPH W/CTA COR W/SCORE W/CA W/CM &/OR WO/CM   Basic metabolic panel   EKG 76-HMCN     No orders of the defined types were placed in this encounter.   There are no Patient Instructions on file for this visit.   I,Mathew Stumpf,acting as a Education administrator for [employer], MD.,have documented all relevant documentation on the behalf of Candee Furbish, MD,as directed by  Candee Furbish, MD while in the presence of Candee Furbish, MD.  I, Candee Furbish, MD, have reviewed all documentation for this visit. The documentation on 12/12/20 for the exam, diagnosis, procedures, and orders are all accurate and complete.   Signed, Candee Furbish, MD  12/12/2020 10:35 AM    [place]

## 2020-12-12 NOTE — Assessment & Plan Note (Signed)
Last LDL 91.  She is on atorvastatin 10 mg once a day.  When she for started atorvastatin, she stated that her bones ache, her shins ache for instance.  After stopping it, she did feel little bit better but her cholesterol did go up.  She has restarted not had any more of these achy sensations.  Excellent.  Expressed the importance of statin therapy.

## 2020-12-12 NOTE — Assessment & Plan Note (Signed)
Her dyspnea on exertion upon fairly minimal exertion may be an anginal equivalent her underlying diagnosis of lupus would increase her overall cardiovascular risk.  She also has hyperlipidemia for which she is taking atorvastatin 10 mg daily. -I will go ahead and check a coronary CT scan with possible FFR analysis for further evaluation of her coronary arteries.  Her echocardiogram was personally reviewed and showed normal pump function.  Excellent.  No significant valvular abnormalities.  No anemia.  Normal liver function.  Normal renal function.  Normal TSH.  Chest x-ray also personally reviewed shows no abnormalities.

## 2020-12-12 NOTE — Assessment & Plan Note (Signed)
Currently being treated for potential lupus with prednisone and hydroxychloroquine

## 2020-12-12 NOTE — Patient Instructions (Addendum)
Medication Instructions:  The current medical regimen is effective;  continue present plan and medications.  *If you need a refill on your cardiac medications before your next appointment, please call your pharmacy*  Lab Work: [place] before your CT scan. If you have labs (blood work) drawn today and your tests are completely normal, you will receive your results only by: [place] (if you have MyChart) OR A paper copy in the mail If you have any lab test that is abnormal or we need to change your treatment, we will call you to review the results.  Your provider has recommended lab work. Please have this collected at [facility] [place]. The lab is open 8:00 am - 4:30 pm. Please avoid 12:00p - 1:00p for lunch hour. You do not need an appointment. Please go to [address] [place], [place] [address]. This is in the Primary Care office on the 3rd floor, let them know you are there for blood work and they will direct you to the lab.  Testing/Procedures:   Your cardiac CT will be scheduled at:   [facility] [address] [place], [place] [address] [phone]  Please arrive at the [facility] main entrance (entrance A) of [facility] 30 minutes prior to test start time. Proceed to the [facility] Radiology Department (first floor) to check-in and test prep.  Please follow these instructions carefully (unless otherwise directed):  On the Night Before the Test: Be sure to Drink plenty of water. Do not consume any caffeinated/decaffeinated beverages or chocolate 12 hours prior to your test. Do not take any antihistamines 12 hours prior to your test.  On the Day of the Test: Drink plenty of water until 1 hour prior to the test. Do not eat any food 4 hours prior to the test. You may take your regular medications prior to the test.  Take metoprolol (Lopressor) two hours prior to test. HOLD Furosemide/Hydrochlorothiazide morning of  the test. FEMALES- please wear underwire-free bra if available, avoid dresses & tight clothing  After the Test: Drink plenty of water. After receiving IV contrast, you may experience a mild flushed feeling. This is normal. On occasion, you may experience a mild rash up to 24 hours after the test. This is not dangerous. If this occurs, you can take Benadryl 25 mg and increase your fluid intake. If you experience trouble breathing, this can be serious. If it is severe call 911 IMMEDIATELY. If it is mild, please call our office. If you take any of these medications: Glipizide/Metformin, Avandament, Glucavance, please do not take 48 hours after completing test unless otherwise instructed.  Please allow 2-4 weeks for scheduling of routine cardiac CTs. Some insurance companies require a pre-authorization which may delay scheduling of this test.   For non-scheduling related questions, please contact the cardiac imaging nurse navigator should you have any questions/concerns: Marchia Bond, Cardiac Imaging Nurse Navigator Gordy Clement, Cardiac Imaging Nurse Navigator [facility] Direct Office Dial: [phone]   For scheduling needs, including cancellations and rescheduling, please call [place], [phone].  Follow-Up: At [facility], you and your health needs are our priority.  As part of our continuing mission to provide you with exceptional heart care, we have created designated Provider Care Teams.  These Care Teams include your primary Cardiologist (physician) and Advanced Practice Providers (APPs -  Physician Assistants and Nurse Practitioners) who all work together to provide you with the care you need, when  you need it.  We recommend signing up for the patient portal called "MyChart".  Sign up information is provided on this After Visit Summary.  MyChart is used to connect with patients for Virtual Visits (Telemedicine).  Patients are able to view lab/test  results, encounter notes, upcoming appointments, etc.  Non-urgent messages can be sent to your provider as well.   To learn more about what you can do with MyChart, go to [email or web address].    Your next appointment:   Follow up will be based on the results of the above testing.  Thank you for choosing [facility]!!

## 2020-12-18 DIAGNOSIS — R0602 Shortness of breath: Secondary | ICD-10-CM | POA: Diagnosis not present

## 2020-12-18 DIAGNOSIS — Z01812 Encounter for preprocedural laboratory examination: Secondary | ICD-10-CM | POA: Diagnosis not present

## 2020-12-18 LAB — BASIC METABOLIC PANEL
BUN/Creatinine Ratio: 18 (ref 12–28)
BUN: 15 mg/dL (ref 8–27)
CO2: 22 mmol/L (ref 20–29)
Calcium: 9.5 mg/dL (ref 8.7–10.3)
Chloride: 103 mmol/L (ref 96–106)
Creatinine, Ser: 0.85 mg/dL (ref 0.57–1.00)
Glucose: 97 mg/dL (ref 70–99)
Potassium: 4.9 mmol/L (ref 3.5–5.2)
Sodium: 140 mmol/L (ref 134–144)
eGFR: 75 mL/min/{1.73_m2} (ref >59)

## 2020-12-20 ENCOUNTER — Telehealth (HOSPITAL_COMMUNITY): Payer: Self-pay | Admitting: *Deleted

## 2020-12-20 ENCOUNTER — Ambulatory Visit (HOSPITAL_COMMUNITY): Payer: PPO

## 2020-12-20 NOTE — Telephone Encounter (Signed)
Reaching out to patient to offer assistance regarding upcoming cardiac imaging study; pt verbalizes understanding of appt date/time, parking situation and where to check in, pre-test NPO status and medications ordered, and verified current allergies; name and call back number provided for further questions should they arise ° °Stephanie Roesler RN Navigator Cardiac Imaging °[facility] °[phone] office °[phone] cell  ° °Patient to take 100mg metoprolol tartrate two hours prior to cardiac CT scan. °

## 2020-12-21 ENCOUNTER — Ambulatory Visit (HOSPITAL_COMMUNITY)
Admission: RE | Admit: 2020-12-21 | Discharge: 2020-12-21 | Disposition: A | Discharge status: Home / Self Care | Payer: PPO | Source: Ambulatory Visit | Attending: Cardiology | Admitting: Cardiology

## 2020-12-21 ENCOUNTER — Other Ambulatory Visit: Payer: Self-pay

## 2020-12-21 DIAGNOSIS — R0602 Shortness of breath: Secondary | ICD-10-CM | POA: Diagnosis not present

## 2020-12-21 DIAGNOSIS — R079 Chest pain, unspecified: Secondary | ICD-10-CM | POA: Diagnosis not present

## 2020-12-21 DIAGNOSIS — R911 Solitary pulmonary nodule: Secondary | ICD-10-CM | POA: Diagnosis not present

## 2020-12-21 DIAGNOSIS — E7800 Pure hypercholesterolemia, unspecified: Secondary | ICD-10-CM | POA: Diagnosis not present

## 2020-12-21 DIAGNOSIS — M329 Systemic lupus erythematosus, unspecified: Secondary | ICD-10-CM | POA: Insufficient documentation

## 2020-12-21 DIAGNOSIS — J90 Pleural effusion, not elsewhere classified: Secondary | ICD-10-CM | POA: Diagnosis not present

## 2020-12-21 DIAGNOSIS — R0600 Dyspnea, unspecified: Secondary | ICD-10-CM | POA: Diagnosis not present

## 2020-12-21 DIAGNOSIS — E785 Hyperlipidemia, unspecified: Secondary | ICD-10-CM | POA: Diagnosis not present

## 2020-12-21 DIAGNOSIS — K769 Liver disease, unspecified: Secondary | ICD-10-CM | POA: Diagnosis not present

## 2020-12-21 MED ORDER — NITROGLYCERIN 0.4 MG SL SUBL
SUBLINGUAL_TABLET | SUBLINGUAL | Status: AC
  Filled 2020-12-21: qty 2

## 2020-12-21 MED ORDER — NITROGLYCERIN 0.4 MG SL SUBL
0.8000 mg | SUBLINGUAL_TABLET | Freq: Once | SUBLINGUAL | Status: AC
  Administered 2020-12-21: 0.8 mg via SUBLINGUAL

## 2020-12-21 MED ORDER — IOHEXOL 350 MG/ML SOLN
95.0000 mL | Freq: Once | INTRAVENOUS | Status: AC | PRN
  Administered 2020-12-21: 95 mL via INTRAVENOUS

## 2020-12-22 ENCOUNTER — Telehealth: Payer: Self-pay | Admitting: *Deleted

## 2020-12-22 ENCOUNTER — Telehealth: Payer: Self-pay | Admitting: Cardiology

## 2020-12-22 DIAGNOSIS — K769 Liver disease, unspecified: Secondary | ICD-10-CM

## 2020-12-22 DIAGNOSIS — R911 Solitary pulmonary nodule: Secondary | ICD-10-CM

## 2020-12-22 NOTE — Telephone Encounter (Signed)
[facility] was calling with a call report for the patient's CT [place] done yesterday 12/21/20

## 2020-12-22 NOTE — Telephone Encounter (Signed)
Spoke with Lloyd who requests Dr Marlou Porch be notified of addendum/over read for pt's cardiac CT 12/21/2020.  Copy of report printed and taken to Dr Marlou Porch for review.

## 2020-12-22 NOTE — Telephone Encounter (Signed)
Pt is aware of results, findings and that she will be contacted to be scheduled for further testing.

## 2020-12-22 NOTE — Telephone Encounter (Signed)
-----   Message from Jerline Pain, MD sent at 12/22/2020  9:13 AM EDT ----- No evidence of coronary plaque, calcium score 0.  However, radiology contacted [place] with note of suspicious hepatic lesions as well as indeterminate 7 mm nodule in left lower lobe.  His recommendation is to proceed with CT of the chest abdomen and pelvis with IV contrast to further evaluate.  We will go ahead and order this for her and I will forward this message to her primary care physician Dr. Carollee Herter as well as her rheumatologist who referred her to me Dr. Vernelle Emerald.  Appreciate their further assistance in this work-up. Candee Furbish, MD

## 2020-12-27 ENCOUNTER — Other Ambulatory Visit: Payer: Self-pay | Admitting: Internal Medicine

## 2020-12-27 DIAGNOSIS — M1990 Unspecified osteoarthritis, unspecified site: Secondary | ICD-10-CM

## 2020-12-27 NOTE — Telephone Encounter (Signed)
Next Visit: 01/03/2021  Last Visit: 10/04/2020  Labs: 09/25/2020 CMP WNL, 06/02/2020 WNL  Eye exam: not on file   Current Dose per office note 10/04/2020: hydroxychloroquine 300 mg p.o. daily  HT:MBPJPETK ANA with mixed antibodies dsDNA and centromere with previous joint swelling  Last Fill: 11/05/2020  Okay to refill Plaquenil?

## 2020-12-28 ENCOUNTER — Other Ambulatory Visit: Payer: Self-pay

## 2020-12-28 ENCOUNTER — Ambulatory Visit (INDEPENDENT_AMBULATORY_CARE_PROVIDER_SITE_OTHER)
Admission: RE | Admit: 2020-12-28 | Discharge: 2020-12-28 | Disposition: A | Discharge status: Home / Self Care | Payer: PPO | Source: Ambulatory Visit | Attending: Cardiology | Admitting: Cardiology

## 2020-12-28 DIAGNOSIS — R918 Other nonspecific abnormal finding of lung field: Secondary | ICD-10-CM | POA: Diagnosis not present

## 2020-12-28 DIAGNOSIS — R911 Solitary pulmonary nodule: Secondary | ICD-10-CM | POA: Diagnosis not present

## 2020-12-28 DIAGNOSIS — K769 Liver disease, unspecified: Secondary | ICD-10-CM | POA: Diagnosis not present

## 2020-12-28 DIAGNOSIS — Z90710 Acquired absence of both cervix and uterus: Secondary | ICD-10-CM | POA: Diagnosis not present

## 2020-12-28 DIAGNOSIS — K7689 Other specified diseases of liver: Secondary | ICD-10-CM | POA: Diagnosis not present

## 2020-12-28 MED ORDER — IOHEXOL 350 MG/ML SOLN
100.0000 mL | Freq: Once | INTRAVENOUS | Status: AC | PRN
  Administered 2020-12-28: 100 mL via INTRAVENOUS

## 2021-01-01 ENCOUNTER — Encounter (HOSPITAL_BASED_OUTPATIENT_CLINIC_OR_DEPARTMENT_OTHER): Payer: Self-pay

## 2021-01-01 ENCOUNTER — Other Ambulatory Visit: Payer: Self-pay | Admitting: Family Medicine

## 2021-01-01 DIAGNOSIS — K769 Liver disease, unspecified: Secondary | ICD-10-CM

## 2021-01-01 DIAGNOSIS — R911 Solitary pulmonary nodule: Secondary | ICD-10-CM

## 2021-01-02 ENCOUNTER — Other Ambulatory Visit: Payer: PPO

## 2021-01-02 NOTE — Progress Notes (Signed)
Office Visit Note  Patient: Stephanie [name]             Date of Birth: 1950/05/18           MRN: [id number]             PCP: [name], DO Referring: [name], * Visit Date: 01/03/2021   Subjective:   History of Present Illness: Stephanie [name] is a 70 y.o. female here for follow up with seronegative inflammatory arthritis on HCQ 300 mg PO daily and tapering down prednisone from 10 mg daily to 5 mg daily. Since our visit a series of studies starting from cardiac coronary CT scan showing liver lesions and abdominal CT showing multiple liver nodules possibly hemangiomata but is scheduled for MRI 10/29. She has experienced some abdominal and central facial swelling with the ongoing prednisone use. She continues to have shortness of breath without cause seen on chest imaging or heart imaging so far.   Previous HPI 10/04/20 Stephanie [name] is a 70 y.o. female here for follow up for inflammatory arthritis due to connective tissue disease and osteoarthritis currently on hydroxychloroquine 300 mg p.o. daily and prednisone 10 mg p.o. daily.  Since her last visit she notices significant improvement in hand pain and swelling.  She has noticed some generalized hair thinning not in any specific distribution not associated with any scalp rashes itching or pain.  She reports a intermittent shortness of breath sensation that occurs both with activity and at rest.  She describes noticing her heart fluttering or feeling different in her chest periodically that has not been typical for her in the past.  She went for recent chest x-ray that was normal and echocardiogram last month that was also normal.  She was just stung on the left hand by a bee yesterday.     07/11/20 Stephanie [name] is a 71 y.o. female here for evaluation of positive ANA with fatigue and joint pain symptoms. She has increased pain in her bilateral hands and wrists for about 6 months with no known preceding health events or  changes. She hs a prior history of osteoarthritis chronically affecting her fingers especially base of the thumb but not this current problem. She has previously experienced carpal tunnel syndrome when working with sewing but does not think this is similar. She notices improvement with NSAIDs but she cannot tolerate taking high doses of these or multiple times per day due to abdominal and epigastric pain and diarrhea. She does not notice much swelling, warmth or discoloration of the affected areas. Workup in primary care office demonstrated a positive ANA and negative RF test. She denies noticing other new health problems such as hair loss, oral ulcers, lymphadenopathy, raynaud's phenomenon, rashes, or fevers.    Labs reviewed 05/2020 ANA 1:160 centromere RF neg Vit D 21.92   DXA 04/2017 Result reviewed, shows osteopenia   No Rheumatology ROS completed.   PMFS History:  Patient Active Problem List   Diagnosis Date Noted   SOB (shortness of breath) 09/25/2020   Inflammatory arthritis 08/09/2020   Positive ANA (antinuclear antibody) 07/11/2020   Bilateral wrist pain 07/11/2020   Fatigue 06/02/2020   Dizziness 06/02/2020   Arthralgia 06/02/2020   Palpitations 06/02/2020   Basal cell carcinoma (BCC) of forehead 04/24/2020   Lactose intolerance    Snoring 05/16/2018   OSA (obstructive sleep apnea) 05/16/2018   Restless legs 01/26/2018   Insomnia 01/26/2018   Snores 01/26/2018  Hx of colonic polyps 08/20/2016   BACK PAIN 02/29/2008   Hyperlipidemia 04/23/2007   Osteoarthritis 04/23/2007   [place] DISEASE, LUMBAR 04/23/2007    Past Medical History:  Diagnosis Date   Arthritis    Cataract    beginning signs of cataract   Complication of anesthesia    Per pt, hard to wake up past c-section/ 1 time.   DDD (degenerative disc disease)    in feet and hands   GERD (gastroesophageal reflux disease)    Hx of colonic polyps 08/20/2016   Hyperlipidemia    Lactose intolerance     Family  History  Problem Relation Age of Onset   Hypertension Mother    Dementia Mother    Arthritis Mother    Alzheimer's disease Mother    Arthritis Father    Liver disease Father        NASH   Hypertension Father    Arthritis Sister    Liver disease Sister    Arthritis Brother    Liver disease Paternal Aunt    Liver disease Paternal Aunt    Liver disease Paternal Aunt    Cystic fibrosis Other        niece   Healthy Son    Healthy Daughter    Past Surgical History:  Procedure Laterality Date   CATARACT EXTRACTION Bilateral summer 2021   [facility]   [place], 1982   COLONOSCOPY     TOTAL ABDOMINAL HYSTERECTOMY W/ BILATERAL SALPINGOOPHORECTOMY     Social History   Social History Narrative   Married, 1 son and 1 daughter   Self-employed   2 caffienated beverages/day   Immunization History  Administered Date(s) Administered   Influenza, High Dose Seasonal PF 01/23/2018   Influenza,inj,Quad PF,6+ Mos 12/08/2012   Influenza-Unspecified 12/31/2019   PFIZER(Purple Top)SARS-COV-2 Vaccination 04/16/2019, 05/12/2019, 12/31/2019   Pneumococcal Conjugate-13 05/06/2017   Pneumococcal Polysaccharide-23 01/04/2020   Td 06/28/2004   Tdap 05/12/2012   Zoster, Live 05/12/2012     Objective: Vital Signs: BP 135/85 (BP Location: Left Arm, Patient Position: Sitting, Cuff Size: Small)   Pulse 80   Resp 12   Ht 5\' 3"  (1.6 m)   Wt 165 lb 12.8 oz (75.2 kg)   BMI 29.37 kg/m    Physical Exam Cardiovascular:     Rate and Rhythm: Normal rate and regular rhythm.  Pulmonary:     Effort: Pulmonary effort is normal.     Breath sounds: Normal breath sounds.  Skin:    General: Skin is warm and dry.     Findings: No rash.     Musculoskeletal Exam: Elbows full ROM no tenderness or swelling Left wrist swelling and tenderness, decreased ROM Bilateral 1st CMC degenerative changes, no tenderness or swelling in fingers Knees full ROM no tenderness or  swelling   Investigation: No additional findings.  Imaging: CT CHEST ABDOMEN PELVIS W CONTRAST  Result Date: 12/30/2020 CLINICAL DATA:  Suspicious hepatic lesions incidentally identified by prior CT chest coronary angiogram EXAM: CT CHEST, ABDOMEN, AND PELVIS WITH CONTRAST TECHNIQUE: Multidetector CT imaging of the chest, abdomen and pelvis was performed following the standard protocol during bolus administration of intravenous contrast. CONTRAST:  151mL OMNIPAQUE IOHEXOL 350 MG/ML SOLN, additional oral enteric contrast COMPARISON:  CT chest coronary angiogram, 12/21/2020 FINDINGS: CT CHEST FINDINGS Cardiovascular: No significant vascular findings. Normal heart size. No pericardial effusion. Mediastinum/Nodes: No enlarged mediastinal, hilar, or axillary lymph nodes. Thyroid gland, trachea, and esophagus demonstrate no significant findings.  Lungs/Pleura: There is a 0.6 cm subpleural nodule of the left lung base (series 6, image 74). No pleural effusion or pneumothorax. Musculoskeletal: No chest wall mass or suspicious bone lesions identified. CT ABDOMEN PELVIS FINDINGS Hepatobiliary: There are intermediate attenuation, rim hypoenhancing lesions of the left lobe of the liver, measuring 3.1 x 2.4 cm (series 5, image 77) and 3.1 x 2.5 cm (series 5, image 74). These demonstrate some heterogeneous peripheral and internal contrast enhancement on delayed phase imaging (series 8, image 9). There are multiple additional smaller lesions scattered throughout the liver, which are too small to confidently characterize but generally fluid attenuation and likely small cysts or hemangiomata (e.g. Series 5, image 83, 76). No gallstones, gallbladder wall thickening, or biliary dilatation. Pancreas: Unremarkable. No pancreatic ductal dilatation or surrounding inflammatory changes. Spleen: Normal in size without significant abnormality. Adrenals/Urinary Tract: Adrenal glands are unremarkable. Kidneys are normal, without renal  calculi, solid lesion, or hydronephrosis. Bladder is unremarkable. Stomach/Bowel: Stomach is within normal limits. Appendix appears normal. No evidence of bowel wall thickening, distention, or inflammatory changes. Vascular/Lymphatic: No significant vascular findings are present. No enlarged abdominal or pelvic lymph nodes. Reproductive: Status post hysterectomy. Other: No abdominal wall hernia or abnormality. No abdominopelvic ascites. Musculoskeletal: No acute or significant osseous findings. IMPRESSION: 1. There are intermediate attenuation, rim hypoenhancing lesions of the left lobe of the liver, measuring 3.1 x 2.4 cm and 3.1 x 2.5 cm. These demonstrate some heterogeneous peripheral and internal contrast enhancement on delayed phase imaging. Although some imaging features suggest unusual hemangiomata, these lesions are not definitively benign and remain worrisome for metastases. These could be more confidently characterized by multiphasic contrast enhanced MR, or assessed for metabolic activity on FDG PET-CT. 2. There are multiple additional smaller lesions scattered throughout the liver, which are too small to confidently characterize but generally fluid attenuation and likely small cysts or hemangiomata. As above, these could be definitively characterized by MR. 3. There is a 0.6 cm subpleural nodule of the left lung base. This is statistically likely to be benign and incidental. Attention on follow-up. If managed by Fleischner Society criteria for incidental pulmonary nodules (i.e. liver lesions are proven benign by further imaging) Non-contrast chest CT at 6-12 months is recommended. If the nodule is stable at time of repeat CT, then future CT at 18-24 months (from today's scan) is considered optional for low-risk patients, but is recommended for high-risk patients. This recommendation follows the consensus statement: Guidelines for Management of Incidental Pulmonary Nodules Detected on CT Images: From the  Fleischner Society 2017; Radiology 2017; 284:228-243. 4. No evidence of primary malignancy, or other evidence of metastatic disease in the chest, abdomen, or pelvis. 5. Status post hysterectomy. Electronically Signed   By: Delanna Ahmadi M.D.   On: 12/30/2020 15:54   CT CORONARY MORPH W/CTA COR W/SCORE W/CA W/CM &/OR WO/CM  Addendum Date: 12/22/2020   ADDENDUM REPORT: 12/22/2020 08:08 EXAM: OVER-READ INTERPRETATION  CT CHEST The following report is an over-read performed by radiologist Dr. Estanislado Pandy [facility] on 12/22/2020. This over-read does not include interpretation of cardiac or coronary anatomy or pathology. The coronary CTA interpretation by the cardiologist is attached. COMPARISON:  None. FINDINGS: Visualized mediastinal structures are normal. There are suspicious hypodensities in the liver. Index lesion measures 2.2 cm on sequence 10, image 52. 7 mm peripheral lung nodule in the left lower lobe on sequence 11 image 49. No large pleural effusions. Degenerative endplate and disc changes in the midthoracic spine. IMPRESSION: 1. Suspicious  hepatic lesions. Findings are concerning for a neoplastic process. There is also an indeterminate 7 mm nodule in the left lower lobe. Recommend CT of the chest, abdomen and pelvis with IV contrast to further evaluate for neoplastic disease. 2. These results will be called to the ordering clinician or representative by the Radiologist Assistant, and communication documented in the PACS or [employer]. Electronically Signed   By: Markus Daft M.D.   On: 12/22/2020 08:08   Result Date: 12/22/2020 CLINICAL DATA:  82F with hyperlipidemia and shortness of breath. EXAM: Cardiac/Coronary  CT TECHNIQUE: The patient was scanned on a [employer]. FINDINGS: A 120 kV prospective scan was triggered in the descending thoracic aorta at 111 HU's. Axial non-contrast 3 mm slices were carried out through the heart. The data set was analyzed on a dedicated  work station and scored using the [place]. Gantry rotation speed was 250 msecs and collimation was .6 mm. No beta blockade and 0.8 mg of sl NTG was given. The 3D data set was reconstructed in 5% intervals of the 67-82 % of the R-R cycle. Diastolic phases were analyzed on a dedicated work station using MPR, MIP and VRT modes. The patient received 80 cc of contrast. Aorta: Normal size.  2.9 cm.  No calcifications.  No dissection. Aortic Valve:  Trileaflet.  No calcifications. Coronary Arteries:  Normal coronary origin.  Right dominance. RCA is a large dominant artery that gives rise to PDA and PLVB. There is no plaque. Left main is a large artery that gives rise to LAD, RI, and LCX arteries. LAD is a large vessel that has no plaque.  D1 has no plaque. LCX is a non-dominant artery that gives rise to one small OM1 branch. There is no plaque. RI has no plaque. Coronary Calcium Score: 0 Percentile: 0 Other findings: Normal pulmonary vein drainage into the left atrium. Normal let atrial appendage without a thrombus. Normal size of the pulmonary artery. IMPRESSION: 1. Coronary calcium score of 0. This was 0 percentile for age-, race-, and sex-matched controls. 2. Normal coronary origin with right dominance. 3. No evidence of CAD. 4.  Consider non-cardiac causes of shortness of breath 5. Interpretation of the non-cardiac thoracic structures is pending by Radiology. Skeet Latch, MD Electronically Signed: By: Skeet Latch M.D. On: 12/21/2020 15:51    Recent Labs: Lab Results  Component Value Date   WBC 5.0 06/02/2020   HGB 13.3 06/02/2020   PLT 230.0 06/02/2020   NA 140 12/18/2020   K 4.9 12/18/2020   CL 103 12/18/2020   CO2 22 12/18/2020   GLUCOSE 97 12/18/2020   BUN 15 12/18/2020   CREATININE 0.85 12/18/2020   BILITOT 0.6 09/25/2020   ALKPHOS 66 09/25/2020   AST 15 09/25/2020   ALT 14 09/25/2020   PROT 6.6 09/25/2020   ALBUMIN 4.3 09/25/2020   CALCIUM 9.5 12/18/2020    Speciality  Comments: No specialty comments available.  Procedures:  No procedures performed Allergies: Penicillins   Assessment / Plan:     Visit Diagnoses: Inflammatory arthritis  Positive ANA (antinuclear antibody) - Plan: predniSONE (DELTASONE) 5 MG tablet, hydroxychloroquine (PLAQUENIL) 200 MG tablet  Appears to be some ongoing joint inflammation with left wrist swelling tenderness and decreased range of motion on exam today.  We will continue the hydroxychloroquine 300 mg daily remain on low-dose prednisone 5 mg daily for now.  I would not recommend any new long-term DMARD treatments till getting results from ongoing imaging.  Primary osteoarthritis,  unspecified site  Definitely some joint pain and stiffness coming from chronic degenerative change this is probably having some of the prednisone responsiveness but also has active inflammation and swelling.  SOB (shortness of breath)  After abdominal MRI is reviewed would next recommend pulmonary function testing if cause remains unknown.  Orders: No orders of the defined types were placed in this encounter.  Meds ordered this encounter  Medications   predniSONE (DELTASONE) 5 MG tablet    Sig: Take 1 tablet (5 mg total) by mouth daily with breakfast.    Dispense:  30 tablet    Refill:  1   hydroxychloroquine (PLAQUENIL) 200 MG tablet    Sig: Take 1.5 tablets (300 mg total) by mouth daily.    Dispense:  45 tablet    Refill:  2      Follow-Up Instructions: No follow-ups on file.   Collier Salina, MD  Note - This record has been created using [employer].  Chart creation errors have been sought, but may not always  have been located. Such creation errors do not reflect on  the standard of medical care.

## 2021-01-03 ENCOUNTER — Ambulatory Visit: Payer: PPO | Admitting: Internal Medicine

## 2021-01-03 ENCOUNTER — Other Ambulatory Visit: Payer: Self-pay

## 2021-01-03 ENCOUNTER — Encounter: Payer: Self-pay | Admitting: Internal Medicine

## 2021-01-03 VITALS — BP 135/85 | HR 80 | Resp 12 | Ht 63.0 in | Wt 165.8 lb

## 2021-01-03 DIAGNOSIS — M1990 Unspecified osteoarthritis, unspecified site: Secondary | ICD-10-CM

## 2021-01-03 DIAGNOSIS — R768 Other specified abnormal immunological findings in serum: Secondary | ICD-10-CM

## 2021-01-03 DIAGNOSIS — M1991 Primary osteoarthritis, unspecified site: Secondary | ICD-10-CM

## 2021-01-03 DIAGNOSIS — R0602 Shortness of breath: Secondary | ICD-10-CM | POA: Diagnosis not present

## 2021-01-03 MED ORDER — PREDNISONE 5 MG PO TABS: 5.0000 mg | ORAL_TABLET | Freq: Every day | ORAL | 1 refills | Status: DC

## 2021-01-03 MED ORDER — HYDROXYCHLOROQUINE SULFATE 200 MG PO TABS: 300.0000 mg | ORAL_TABLET | Freq: Every day | ORAL | 2 refills | Status: DC

## 2021-01-06 ENCOUNTER — Ambulatory Visit (HOSPITAL_BASED_OUTPATIENT_CLINIC_OR_DEPARTMENT_OTHER)
Admission: RE | Admit: 2021-01-06 | Discharge: 2021-01-06 | Disposition: A | Discharge status: Home / Self Care | Payer: PPO | Source: Ambulatory Visit | Attending: Family Medicine | Admitting: Family Medicine

## 2021-01-06 ENCOUNTER — Other Ambulatory Visit: Payer: Self-pay

## 2021-01-06 DIAGNOSIS — D1803 Hemangioma of intra-abdominal structures: Secondary | ICD-10-CM | POA: Diagnosis not present

## 2021-01-06 DIAGNOSIS — K769 Liver disease, unspecified: Secondary | ICD-10-CM | POA: Insufficient documentation

## 2021-01-06 DIAGNOSIS — K7689 Other specified diseases of liver: Secondary | ICD-10-CM | POA: Diagnosis not present

## 2021-01-06 MED ORDER — GADOBUTROL 1 MMOL/ML IV SOLN
7.5000 mL | Freq: Once | INTRAVENOUS | Status: AC | PRN
  Administered 2021-01-06: 7.5 mL via INTRAVENOUS

## 2021-01-22 ENCOUNTER — Other Ambulatory Visit: Payer: Self-pay

## 2021-01-22 ENCOUNTER — Encounter: Payer: Self-pay | Admitting: Pulmonary Disease

## 2021-01-22 ENCOUNTER — Ambulatory Visit (INDEPENDENT_AMBULATORY_CARE_PROVIDER_SITE_OTHER): Payer: PPO | Admitting: Pulmonary Disease

## 2021-01-22 VITALS — BP 126/72 | HR 77 | Ht 63.0 in | Wt 165.2 lb

## 2021-01-22 DIAGNOSIS — R911 Solitary pulmonary nodule: Secondary | ICD-10-CM

## 2021-01-22 DIAGNOSIS — R0602 Shortness of breath: Secondary | ICD-10-CM

## 2021-01-22 DIAGNOSIS — Z789 Other specified health status: Secondary | ICD-10-CM | POA: Diagnosis not present

## 2021-01-22 DIAGNOSIS — R768 Other specified abnormal immunological findings in serum: Secondary | ICD-10-CM

## 2021-01-22 NOTE — Progress Notes (Signed)
Synopsis: Referred in November 2022 for pulmonary nodule by Carollee Herter, Alferd Apa, *  Subjective:   PATIENT ID: Stephanie Stephanie [name] DOB: [birth year], MRN: [id number]  Chief Complaint  Patient presents with   Consult    Referred by PCP for abnormal CT and MRI for lung nodule.      This is a 70 year old Stephanie [name], past medical history of osteoarthritis, GERD hyperlipidemia.  Patient is a never smoker.She had a CT scan of the chest abdomen and pelvis completed by cardiology.  This was done December 28, 2020 she was found to have hypodense lesion within the liver which led to abdominal MRI.  She is also found to have a 6 mm subpleural lung nodule in the left lung base.  The abdominal MRI with and without contrast was consistent with benign hepatic hemangiomas and cysts no evidence of malignancy.  Patient was referred here for evaluation of the 6 mm subpleural lower lobe nodule.  She also states that she was recently diagnosed with a positive ANA and has developed shortness of breath.  She was seen by rheumatology and started on low-dose daily prednisone.  She had joint pains as well.  The prednisone has helped with the joint pains as well as her plantars fasciitis.  Of note she has gained weight.  Occupation: Retired Pharmacist, hospital, Regulatory affairs officer.  Lifelong non-smoker.  She is anxious about this lower lobe nodule.  We reviewed CT imaging today in the office.   Past Medical History:  Diagnosis Date   Arthritis    Cataract    beginning signs of cataract   Complication of anesthesia    Per pt, hard to wake up past c-section/ 1 time.   DDD (degenerative disc disease)    in feet and hands   GERD (gastroesophageal reflux disease)    Hx of colonic polyps 08/20/2016   Hyperlipidemia    Lactose intolerance      Family History  Problem Relation Age of Onset   Hypertension Mother    Dementia Mother    Arthritis Mother    Alzheimer's disease Mother    Arthritis Father    Liver disease Father         NASH   Hypertension Father    Arthritis Sister    Liver disease Sister    Arthritis Brother    Liver disease Paternal Aunt    Liver disease Paternal Aunt    Liver disease Paternal Aunt    Cystic fibrosis Other        niece   Healthy Son    Healthy Daughter      Past Surgical History:  Procedure Laterality Date   CATARACT EXTRACTION Bilateral summer 2021   [facility]   [place], 1982   COLONOSCOPY     TOTAL ABDOMINAL HYSTERECTOMY W/ BILATERAL SALPINGOOPHORECTOMY      Social History   Socioeconomic History   Marital status: Married    Spouse name: Not on file   Number of children: 2   Years of education: Not on file   Highest education level: Not on file  Occupational History   Occupation: self  Tobacco Use   Smoking status: Never   Smokeless tobacco: Never  Vaping Use   Vaping Use: Never used  Substance and Sexual Activity   Alcohol use: Yes    Alcohol/week: 7.0 standard drinks    Types: 7 Standard drinks or equivalent per week   Drug use: No   Sexual activity:  Yes    Partners: Male  Other Topics Concern   Not on file  Social History Narrative   Married, 1 son and 1 daughter   Self-employed   2 caffienated beverages/day   Social Determinants of Health   Financial Resource Strain: Not on file  Food Insecurity: Not on file  Transportation Needs: Not on file  Physical Activity: Not on file  Stress: Not on file  Social Connections: Not on file  Intimate Partner Violence: Not on file     Allergies  Allergen Reactions   Penicillins Anaphylaxis     Outpatient Medications Prior to Visit  Medication Sig Dispense Refill   atorvastatin (LIPITOR) 10 MG tablet Take 1 tablet (10 mg total) by mouth daily. 90 tablet 3   doxylamine, Sleep, (UNISOM) 25 MG tablet Take 25 mg by mouth at bedtime as needed.     hydroxychloroquine (PLAQUENIL) 200 MG tablet Take 1.5 tablets (300 mg total) by mouth daily. 45 tablet 2   predniSONE (DELTASONE) 5 MG tablet  Take 1 tablet (5 mg total) by mouth daily with breakfast. (Patient taking differently: Take 10 mg by mouth daily with breakfast.) 30 tablet 1   rOPINIRole (REQUIP) 0.5 MG tablet TAKE 2 TABLETS BY MOUTH AT BEDTIME ,  MAY  INCREASE  TO  3  TABLETS  AT  BEDTIME  IF  NEEDED 270 tablet 0   metoprolol tartrate (LOPRESSOR) 100 MG tablet Take 1 tablet (100 mg total) by mouth once for 1 dose. Take one tablet 2 hours before your CT scan (Patient not taking: Reported on 01/03/2021) 1 tablet 0   Vitamin D, Ergocalciferol, (DRISDOL) 1.25 MG (50000 UNIT) CAPS capsule Take 1 capsule (50,000 Units total) by mouth every 7 (seven) days. (Patient not taking: Reported on 01/03/2021) 12 capsule 1   Facility-Administered Medications Prior to Visit  Medication Dose Route Frequency Provider Last Rate Last Admin   0.9 %  sodium chloride infusion  500 mL Intravenous Continuous Gatha Mayer, MD       cyanocobalamin ((VITAMIN B-12)) injection 1,000 mcg  1,000 mcg Intramuscular Q30 days Carollee Herter, Kendrick Fries R, DO   1,000 mcg at 07/08/19 1511   cyanocobalamin ((VITAMIN B-12)) injection 1,000 mcg  1,000 mcg Intramuscular Q30 days Carollee Herter, Kendrick Fries R, DO   1,000 mcg at 07/15/19 1004    Review of Systems  Constitutional:  Negative for chills, fever, malaise/fatigue and weight loss.  HENT:  Negative for hearing loss, sore throat and tinnitus.   Eyes:  Negative for blurred vision and double vision.  Respiratory:  Positive for shortness of breath. Negative for cough, hemoptysis, sputum production, wheezing and stridor.   Cardiovascular:  Negative for chest pain, palpitations, orthopnea, leg swelling and PND.  Gastrointestinal:  Negative for abdominal pain, constipation, diarrhea, heartburn, nausea and vomiting.  Genitourinary:  Negative for dysuria, hematuria and urgency.  Musculoskeletal:  Negative for joint pain and myalgias.  Skin:  Negative for itching and rash.  Neurological:  Negative for dizziness, tingling, weakness  and headaches.  Endo/Heme/Allergies:  Negative for environmental allergies. Does not bruise/bleed easily.  Psychiatric/Behavioral:  Negative for depression. The patient is not nervous/anxious and does not have insomnia.   All other systems reviewed and are negative.   Objective:  Physical Exam Vitals reviewed.  Constitutional:      General: She is not in acute distress.    Appearance: She is well-developed.  HENT:     Head: Normocephalic and atraumatic.  Eyes:     General:  No scleral icterus.    Conjunctiva/sclera: Conjunctivae normal.     Pupils: Pupils are equal, round, and reactive to light.  Neck:     Vascular: No JVD.     Trachea: No tracheal deviation.  Cardiovascular:     Rate and Rhythm: Normal rate and regular rhythm.     Heart sounds: Normal heart sounds. No murmur heard. Pulmonary:     Effort: Pulmonary effort is normal. No tachypnea, accessory muscle usage or respiratory distress.     Breath sounds: Normal breath sounds. No stridor. No wheezing, rhonchi or rales.  Abdominal:     Tenderness: There is no abdominal tenderness.  Musculoskeletal:        General: Deformity present. No tenderness.     Cervical back: Neck supple.     Comments: Hands with some evidence of osteoarthritis in the distal phalanx  Lymphadenopathy:     Cervical: No cervical adenopathy.  Skin:    General: Skin is warm and dry.     Capillary Refill: Capillary refill takes less than 2 seconds.     Findings: No rash.  Neurological:     Mental Status: She is alert and oriented to person, place, and time.  Psychiatric:        Behavior: Behavior normal.     Vitals:   01/22/21 0929  BP: 126/72  Pulse: 77  SpO2: 100%  Weight: 165 lb 3.2 oz (74.9 kg)  Height: 5\' 3"  (1.6 m)   100% on RA BMI Readings from Last 3 Encounters:  01/22/21 29.26 kg/m  01/03/21 29.37 kg/m  12/12/20 29.18 kg/m   Wt Readings from Last 3 Encounters:  01/22/21 165 lb 3.2 oz (74.9 kg)  01/03/21 165 lb 12.8 oz  (75.2 kg)  12/12/20 164 lb 11.2 oz (74.7 kg)     CBC    Component Value Date/Time   WBC 5.0 06/02/2020 1104   RBC 4.20 06/02/2020 1104   HGB 13.3 06/02/2020 1104   HCT 39.1 06/02/2020 1104   PLT 230.0 06/02/2020 1104   MCV 93.3 06/02/2020 1104   MCH 30.6 01/04/2020 1001   MCHC 34.0 06/02/2020 1104   RDW 13.4 06/02/2020 1104   LYMPHSABS 1.7 06/02/2020 1104   MONOABS 0.5 06/02/2020 1104   EOSABS 0.1 06/02/2020 1104   BASOSABS 0.0 06/02/2020 1104     Chest Imaging: CT chest 12/28/2020: Hepatic lesions looked at via CT had follow-up MRI consistent with hemangioma. Small 6 mm subpleural nodule. The patient's images have been independently reviewed by me.    Pulmonary Functions Testing Results: No flowsheet data found.  FeNO:   Pathology:   Echocardiogram:   Heart Catheterization:     Assessment & Plan:     ICD-10-CM   1. Nodule of lower lobe of left lung  R91.1 CT Chest Wo Contrast    Pulmonary Function Test    2. Non-smoker  Z78.9 CT Chest Wo Contrast    3. SOB (shortness of breath)  R06.02     4. Positive ANA (antinuclear antibody)  R76.8       Discussion: This is a 70 year old Stephanie [name], non-smoker found to have an incidental left lower lobe 6 mm subpleural pulmonary nodule.  This is very round to smooth edges.  Also lifelong non-smoker.  She has no significant risk factors for the development of malignancy and no previous family history of lung cancer or no previous malignancy diagnoses in the past 5 years.  We looked at the patient's solitary pulmonary nodule risk calculator today  in the office which places this nodule at approximately 3.2% risk of malignancy.  We also discussed how these calculators are perfect but would at least recommend follow-up of this nodule.  Plan: Repeat noncontrasted CT scan of the chest in 9 months this will be done in July 2023. As for her shortness of breath I think it is potentially multifactorial. We will start by getting  pulmonary function tests. We also talked about a trial of albuterol.  She also would like to hold off on this. I think these are reasonable neck steps. Return to clinic in about 4 weeks or so after PFTs are complete. Follow-up with rheumatology as scheduled  Pending on PFT results if there was any evidence of restriction or reduced DLCO would have lower threshold for obtaining HRCT imaging of the chest with inspiratory and expiratory films.    Current Outpatient Medications:    atorvastatin (LIPITOR) 10 MG tablet, Take 1 tablet (10 mg total) by mouth daily., Disp: 90 tablet, Rfl: 3   doxylamine, Sleep, (UNISOM) 25 MG tablet, Take 25 mg by mouth at bedtime as needed., Disp: , Rfl:    hydroxychloroquine (PLAQUENIL) 200 MG tablet, Take 1.5 tablets (300 mg total) by mouth daily., Disp: 45 tablet, Rfl: 2   predniSONE (DELTASONE) 5 MG tablet, Take 1 tablet (5 mg total) by mouth daily with breakfast. (Patient taking differently: Take 10 mg by mouth daily with breakfast.), Disp: 30 tablet, Rfl: 1   rOPINIRole (REQUIP) 0.5 MG tablet, TAKE 2 TABLETS BY MOUTH AT BEDTIME ,  MAY  INCREASE  TO  3  TABLETS  AT  BEDTIME  IF  NEEDED, Disp: 270 tablet, Rfl: 0  Current Facility-Administered Medications:    0.9 %  sodium chloride infusion, 500 mL, Intravenous, Continuous, Gatha Mayer, MD    Garner Nash, DO [facility] 01/22/2021 9:36 AM

## 2021-01-22 NOTE — Patient Instructions (Addendum)
Thank you for visiting Dr. Valeta Harms at [facility]. Today we recommend the following:  Orders Placed This Encounter  Procedures   CT Chest Wo Contrast   Pulmonary Function Test   PFTs next available   CT Chest in 9 months, July 2023  Return in about 4 weeks (around 02/19/2021) for with APP or Dr. Valeta Harms. After PFTs complete     Please do your part to reduce the spread of COVID-19.

## 2021-01-24 ENCOUNTER — Ambulatory Visit (INDEPENDENT_AMBULATORY_CARE_PROVIDER_SITE_OTHER): Payer: PPO | Admitting: Pulmonary Disease

## 2021-01-24 ENCOUNTER — Other Ambulatory Visit: Payer: Self-pay | Admitting: Family Medicine

## 2021-01-24 ENCOUNTER — Other Ambulatory Visit: Payer: Self-pay

## 2021-01-24 DIAGNOSIS — R911 Solitary pulmonary nodule: Secondary | ICD-10-CM

## 2021-01-24 DIAGNOSIS — G2581 Restless legs syndrome: Secondary | ICD-10-CM

## 2021-01-24 LAB — PULMONARY FUNCTION TEST
DL/VA % pred: 106 %
DL/VA: 4.47 ml/min/mmHg/L
DLCO cor % pred: 92 %
DLCO cor: 17.6 ml/min/mmHg
DLCO unc % pred: 92 %
DLCO unc: 17.6 ml/min/mmHg
FEF 25-75 Post: 2.86 L/sec
FEF 25-75 Pre: 2.37 L/sec
FEF2575-%Change-Post: 20 %
FEF2575-%Pred-Post: 147 %
FEF2575-%Pred-Pre: 122 %
FEV1-%Change-Post: 5 %
FEV1-%Pred-Post: 95 %
FEV1-%Pred-Pre: 90 %
FEV1-Post: 2.13 L
FEV1-Pre: 2.02 L
FEV1FVC-%Change-Post: 5 %
FEV1FVC-%Pred-Pre: 108 %
FEV6-%Change-Post: 0 %
FEV6-%Pred-Post: 87 %
FEV6-%Pred-Pre: 86 %
FEV6-Post: 2.45 L
FEV6-Pre: 2.44 L
FEV6FVC-%Pred-Post: 104 %
FEV6FVC-%Pred-Pre: 104 %
FVC-%Change-Post: 0 %
FVC-%Pred-Post: 83 %
FVC-%Pred-Pre: 82 %
FVC-Post: 2.45 L
FVC-Pre: 2.44 L
Post FEV1/FVC ratio: 87 %
Post FEV6/FVC ratio: 100 %
Pre FEV1/FVC ratio: 83 %
Pre FEV6/FVC Ratio: 100 %
RV % pred: 96 %
RV: 2.03 L
TLC % pred: 95 %
TLC: 4.67 L

## 2021-01-24 NOTE — Patient Instructions (Signed)
Full PFT performed today. °

## 2021-01-24 NOTE — Progress Notes (Signed)
Full PFT performed today. °

## 2021-01-30 ENCOUNTER — Encounter: Payer: Self-pay | Admitting: Pulmonary Disease

## 2021-02-23 DIAGNOSIS — Z85828 Personal history of other malignant neoplasm of skin: Secondary | ICD-10-CM | POA: Diagnosis not present

## 2021-02-23 DIAGNOSIS — L570 Actinic keratosis: Secondary | ICD-10-CM | POA: Diagnosis not present

## 2021-02-23 DIAGNOSIS — L820 Inflamed seborrheic keratosis: Secondary | ICD-10-CM | POA: Diagnosis not present

## 2021-02-23 DIAGNOSIS — I788 Other diseases of capillaries: Secondary | ICD-10-CM | POA: Diagnosis not present

## 2021-02-23 DIAGNOSIS — D225 Melanocytic nevi of trunk: Secondary | ICD-10-CM | POA: Diagnosis not present

## 2021-02-28 ENCOUNTER — Encounter: Payer: Self-pay | Admitting: Pulmonary Disease

## 2021-02-28 ENCOUNTER — Other Ambulatory Visit: Payer: Self-pay

## 2021-02-28 ENCOUNTER — Ambulatory Visit: Payer: PPO | Admitting: Pulmonary Disease

## 2021-02-28 VITALS — BP 132/80 | HR 79 | Temp 97.6°F | Ht 63.0 in | Wt 166.0 lb

## 2021-02-28 DIAGNOSIS — Z789 Other specified health status: Secondary | ICD-10-CM | POA: Diagnosis not present

## 2021-02-28 DIAGNOSIS — R911 Solitary pulmonary nodule: Secondary | ICD-10-CM | POA: Diagnosis not present

## 2021-02-28 DIAGNOSIS — R0602 Shortness of breath: Secondary | ICD-10-CM

## 2021-02-28 NOTE — Progress Notes (Signed)
Synopsis: Referred in November 2022 for pulmonary nodule by Carollee Herter, Alferd Apa, *  Subjective:   PATIENT ID: Stephanie [name] GENDER: female DOB: [birth year], MRN: [id number]  Chief Complaint  Patient presents with   Follow-up    Patient says she's still having shortness of breath.     This is a 70 year old female, past medical history of osteoarthritis, GERD hyperlipidemia.  Patient is a never smoker.She had a CT scan of the chest abdomen and pelvis completed by cardiology.  This was done December 28, 2020 she was found to have hypodense lesion within the liver which led to abdominal MRI.  She is also found to have a 6 mm subpleural lung nodule in the left lung base.  The abdominal MRI with and without contrast was consistent with benign hepatic hemangiomas and cysts no evidence of malignancy.  Patient was referred here for evaluation of the 6 mm subpleural lower lobe nodule.  She also states that she was recently diagnosed with a positive ANA and has developed shortness of breath.  She was seen by rheumatology and started on low-dose daily prednisone.  She had joint pains as well.  The prednisone has helped with the joint pains as well as her plantars fasciitis.  Of note she has gained weight.  Occupation: Retired Pharmacist, hospital, Regulatory affairs officer.  Lifelong non-smoker.  She is anxious about this lower lobe nodule.  We reviewed CT imaging today in the office.  OV 02/28/2021: Here today for follow-up after recent pulmonary function test.  Recent pulmonary function test shows normal ratio, no obstruction, normal DLCO, reduced ERV which is usually related to body habitus however she is not significantly overweight.  She does feel better than the past couple of months.  Her rheumatologist did put her on prednisone.   Past Medical History:  Diagnosis Date   Arthritis    Cataract    beginning signs of cataract   Complication of anesthesia    Per pt, hard to wake up past c-section/ 1 time.   DDD (degenerative  disc disease)    in feet and hands   GERD (gastroesophageal reflux disease)    Hx of colonic polyps 08/20/2016   Hyperlipidemia    Lactose intolerance      Family History  Problem Relation Age of Onset   Hypertension Mother    Dementia Mother    Arthritis Mother    Alzheimer's disease Mother    Arthritis Father    Liver disease Father        NASH   Hypertension Father    Arthritis Sister    Liver disease Sister    Arthritis Brother    Liver disease Paternal Aunt    Liver disease Paternal Aunt    Liver disease Paternal Aunt    Cystic fibrosis Other        niece   Healthy Son    Healthy Daughter      Past Surgical History:  Procedure Laterality Date   CATARACT EXTRACTION Bilateral summer 2021   [facility]   CESAREAN SECTION     1980, 1982   COLONOSCOPY     TOTAL ABDOMINAL HYSTERECTOMY W/ BILATERAL SALPINGOOPHORECTOMY      Social History   Socioeconomic History   Marital status: Married    Spouse name: Not on file   Number of children: 2   Years of education: Not on file   Highest education level: Not on file  Occupational History   Occupation: self  Tobacco Use  Smoking status: Never   Smokeless tobacco: Never  Vaping Use   Vaping Use: Never used  Substance and Sexual Activity   Alcohol use: Yes    Alcohol/week: 7.0 standard drinks    Types: 7 Standard drinks or equivalent per week   Drug use: No   Sexual activity: Yes    Partners: Male  Other Topics Concern   Not on file  Social History Narrative   Married, 1 son and 1 daughter   Self-employed   2 caffienated beverages/day   Social Determinants of Health   Financial Resource Strain: Not on file  Food Insecurity: Not on file  Transportation Needs: Not on file  Physical Activity: Not on file  Stress: Not on file  Social Connections: Not on file  Intimate Partner Violence: Not on file     Allergies  Allergen Reactions   Penicillins Anaphylaxis     Outpatient Medications Prior to Visit   Medication Sig Dispense Refill   atorvastatin (LIPITOR) 10 MG tablet Take 1 tablet (10 mg total) by mouth daily. 90 tablet 3   doxylamine, Sleep, (UNISOM) 25 MG tablet Take 25 mg by mouth at bedtime as needed.     hydroxychloroquine (PLAQUENIL) 200 MG tablet Take 1.5 tablets (300 mg total) by mouth daily. 45 tablet 2   predniSONE (DELTASONE) 5 MG tablet Take 1 tablet (5 mg total) by mouth daily with breakfast. (Patient taking differently: Take 10 mg by mouth daily with breakfast.) 30 tablet 1   rOPINIRole (REQUIP) 0.5 MG tablet TAKE 2 TABLETS BY MOUTH AT BEDTIME. MAY INCREASE TO 3 TABLETS AT BEDTIME IF NEEDED 270 tablet 0   Facility-Administered Medications Prior to Visit  Medication Dose Route Frequency Provider Last Rate Last Admin   0.9 %  sodium chloride infusion  500 mL Intravenous Continuous Gatha Mayer, MD        Review of Systems  Constitutional:  Negative for chills, fever, malaise/fatigue and weight loss.  HENT:  Negative for hearing loss, sore throat and tinnitus.   Eyes:  Negative for blurred vision and double vision.  Respiratory:  Positive for shortness of breath. Negative for cough, hemoptysis, sputum production, wheezing and stridor.   Cardiovascular:  Negative for chest pain, palpitations, orthopnea, leg swelling and PND.  Gastrointestinal:  Negative for abdominal pain, constipation, diarrhea, heartburn, nausea and vomiting.  Genitourinary:  Negative for dysuria, hematuria and urgency.  Musculoskeletal:  Negative for joint pain and myalgias.  Skin:  Negative for itching and rash.  Neurological:  Negative for dizziness, tingling, weakness and headaches.  Endo/Heme/Allergies:  Negative for environmental allergies. Does not bruise/bleed easily.  Psychiatric/Behavioral:  Negative for depression. The patient is not nervous/anxious and does not have insomnia.   All other systems reviewed and are negative.   Objective:  Physical Exam Vitals reviewed.  Constitutional:       General: She is not in acute distress.    Appearance: She is well-developed.  HENT:     Head: Normocephalic and atraumatic.  Eyes:     General: No scleral icterus.    Conjunctiva/sclera: Conjunctivae normal.     Pupils: Pupils are equal, round, and reactive to light.  Neck:     Vascular: No JVD.     Trachea: No tracheal deviation.  Cardiovascular:     Rate and Rhythm: Normal rate and regular rhythm.     Heart sounds: Normal heart sounds. No murmur heard. Pulmonary:     Effort: Pulmonary effort is normal. No tachypnea, accessory muscle  usage or respiratory distress.     Breath sounds: No stridor. No wheezing, rhonchi or rales.  Abdominal:     General: Bowel sounds are normal. There is no distension.     Palpations: Abdomen is soft.     Tenderness: There is no abdominal tenderness.  Musculoskeletal:        General: No tenderness.     Cervical back: Neck supple.  Lymphadenopathy:     Cervical: No cervical adenopathy.  Skin:    General: Skin is warm and dry.     Capillary Refill: Capillary refill takes less than 2 seconds.     Findings: No rash.  Neurological:     Mental Status: She is alert and oriented to person, place, and time.  Psychiatric:        Behavior: Behavior normal.     Vitals:   02/28/21 0914  BP: 132/80  Pulse: 79  Temp: 97.6 F (36.4 C)  TempSrc: Oral  SpO2: 98%  Weight: 166 lb (75.3 kg)  Height: 5\' 3"  (1.6 m)    98% on RA BMI Readings from Last 3 Encounters:  02/28/21 29.41 kg/m  01/22/21 29.26 kg/m  01/03/21 29.37 kg/m   Wt Readings from Last 3 Encounters:  02/28/21 166 lb (75.3 kg)  01/22/21 165 lb 3.2 oz (74.9 kg)  01/03/21 165 lb 12.8 oz (75.2 kg)     CBC    Component Value Date/Time   WBC 5.0 06/02/2020 1104   RBC 4.20 06/02/2020 1104   HGB 13.3 06/02/2020 1104   HCT 39.1 06/02/2020 1104   PLT 230.0 06/02/2020 1104   MCV 93.3 06/02/2020 1104   MCH 30.6 01/04/2020 1001   MCHC 34.0 06/02/2020 1104   RDW 13.4 06/02/2020  1104   LYMPHSABS 1.7 06/02/2020 1104   MONOABS 0.5 06/02/2020 1104   EOSABS 0.1 06/02/2020 1104   BASOSABS 0.0 06/02/2020 1104     Chest Imaging: CT chest 12/28/2020: Hepatic lesions looked at via CT had follow-up MRI consistent with hemangioma. Small 6 mm subpleural nodule. The patient's images have been independently reviewed by me.    Pulmonary Functions Testing Results: PFT Results Latest Ref Rng & Units 01/24/2021  FVC-Pre L 2.44  FVC-Predicted Pre % 82  FVC-Post L 2.45  FVC-Predicted Post % 83  Pre FEV1/FVC % % 83  Post FEV1/FCV % % 87  FEV1-Pre L 2.02  FEV1-Predicted Pre % 90  FEV1-Post L 2.13  DLCO uncorrected ml/min/mmHg 17.60  DLCO UNC% % 92  DLCO corrected ml/min/mmHg 17.60  DLCO COR %Predicted % 92  DLVA Predicted % 106  TLC L 4.67  TLC % Predicted % 95  RV % Predicted % 96    FeNO:   Pathology:   Echocardiogram:   Heart Catheterization:     Assessment & Plan:     ICD-10-CM   1. Nodule of lower lobe of left lung  R91.1     2. SOB (shortness of breath)  R06.02     3. Non-smoker  Z78.9        Discussion: This is a 70 year old female, non-smoker found to have incidental left lower lobe 6 mm subpleural pulmonary nodule, smooth edges, no significant risk factors for malignancy, no previous malignancies in the past 5 years.  She has a repeat noncontrasted CT scan of the chest scheduled in July 2023.  She was seen also for shortness of breath.  She is currently working with a rheumatologist on a possible underlying diagnosis of lupus/autoimmune disease.  She  is on prednisone and started to feel some better.  Plan: We reviewed pulmonary function test today in the office. She has no cyst significant obstruction or restriction. She does have a reduced ERV although her body habitus is not very big, BMI of 29. I do not think she needs any further axial imaging of the chest. Will hold off on trial of albuterol. She will have a repeat CT scan in July. If  she has any subtle changes on her parenchyma we will continue to follow those otherwise we will see her then.     Current Outpatient Medications:    atorvastatin (LIPITOR) 10 MG tablet, Take 1 tablet (10 mg total) by mouth daily., Disp: 90 tablet, Rfl: 3   doxylamine, Sleep, (UNISOM) 25 MG tablet, Take 25 mg by mouth at bedtime as needed., Disp: , Rfl:    hydroxychloroquine (PLAQUENIL) 200 MG tablet, Take 1.5 tablets (300 mg total) by mouth daily., Disp: 45 tablet, Rfl: 2   predniSONE (DELTASONE) 5 MG tablet, Take 1 tablet (5 mg total) by mouth daily with breakfast. (Patient taking differently: Take 10 mg by mouth daily with breakfast.), Disp: 30 tablet, Rfl: 1   rOPINIRole (REQUIP) 0.5 MG tablet, TAKE 2 TABLETS BY MOUTH AT BEDTIME. MAY INCREASE TO 3 TABLETS AT BEDTIME IF NEEDED, Disp: 270 tablet, Rfl: 0  Current Facility-Administered Medications:    0.9 %  sodium chloride infusion, 500 mL, Intravenous, Continuous, Gatha Mayer, MD    Garner Nash, DO [facility] 02/28/2021 9:21 AM

## 2021-02-28 NOTE — Patient Instructions (Signed)
Thank you for visiting Dr. Valeta Harms at [facility]. Today we recommend the following:  Ct nodule follow up in July 2023  Return in about 8 months (around 10/29/2021) for with Eric Form, NP, or Dr. Valeta Harms.    Please do your part to reduce the spread of COVID-19.

## 2021-03-29 ENCOUNTER — Encounter: Payer: Self-pay | Admitting: Family Medicine

## 2021-03-29 ENCOUNTER — Other Ambulatory Visit (HOSPITAL_BASED_OUTPATIENT_CLINIC_OR_DEPARTMENT_OTHER): Payer: Self-pay | Admitting: Family Medicine

## 2021-03-29 ENCOUNTER — Ambulatory Visit (INDEPENDENT_AMBULATORY_CARE_PROVIDER_SITE_OTHER): Payer: PPO | Admitting: Family Medicine

## 2021-03-29 VITALS — BP 130/84 | HR 76 | Temp 97.8°F | Resp 18 | Ht 63.0 in | Wt 169.4 lb

## 2021-03-29 DIAGNOSIS — E785 Hyperlipidemia, unspecified: Secondary | ICD-10-CM | POA: Diagnosis not present

## 2021-03-29 DIAGNOSIS — Z0000 Encounter for general adult medical examination without abnormal findings: Secondary | ICD-10-CM | POA: Diagnosis not present

## 2021-03-29 DIAGNOSIS — Z1211 Encounter for screening for malignant neoplasm of colon: Secondary | ICD-10-CM | POA: Diagnosis not present

## 2021-03-29 DIAGNOSIS — G2581 Restless legs syndrome: Secondary | ICD-10-CM

## 2021-03-29 DIAGNOSIS — E2839 Other primary ovarian failure: Secondary | ICD-10-CM | POA: Diagnosis not present

## 2021-03-29 DIAGNOSIS — Z1231 Encounter for screening mammogram for malignant neoplasm of breast: Secondary | ICD-10-CM

## 2021-03-29 DIAGNOSIS — M1990 Unspecified osteoarthritis, unspecified site: Secondary | ICD-10-CM

## 2021-03-29 DIAGNOSIS — Z23 Encounter for immunization: Secondary | ICD-10-CM | POA: Diagnosis not present

## 2021-03-29 LAB — CBC WITH DIFFERENTIAL/PLATELET
Basophils Absolute: 0 10*3/uL (ref 0.0–0.1)
Basophils Relative: 0.5 % (ref 0.0–3.0)
Eosinophils Absolute: 0.1 10*3/uL (ref 0.0–0.7)
Eosinophils Relative: 1.4 % (ref 0.0–5.0)
HCT: 39.5 % (ref 36.0–46.0)
Hemoglobin: 12.8 g/dL (ref 12.0–15.0)
Lymphocytes Relative: 16.9 % (ref 12.0–46.0)
Lymphs Abs: 1.4 10*3/uL (ref 0.7–4.0)
MCHC: 32.4 g/dL (ref 30.0–36.0)
MCV: 94.7 fl (ref 78.0–100.0)
Monocytes Absolute: 0.6 10*3/uL (ref 0.1–1.0)
Monocytes Relative: 7.2 % (ref 3.0–12.0)
Neutro Abs: 6.1 10*3/uL (ref 1.4–7.7)
Neutrophils Relative %: 74 % (ref 43.0–77.0)
Platelets: 215 10*3/uL (ref 150.0–400.0)
RBC: 4.18 Mil/uL (ref 3.87–5.11)
RDW: 13.3 % (ref 11.5–15.5)
WBC: 8.2 10*3/uL (ref 4.0–10.5)

## 2021-03-29 LAB — COMPREHENSIVE METABOLIC PANEL
ALT: 14 U/L (ref 0–35)
AST: 15 U/L (ref 0–37)
Albumin: 4.2 g/dL (ref 3.5–5.2)
Alkaline Phosphatase: 56 U/L (ref 39–117)
BUN: 15 mg/dL (ref 6–23)
CO2: 27 mEq/L (ref 19–32)
Calcium: 9.2 mg/dL (ref 8.4–10.5)
Chloride: 106 mEq/L (ref 96–112)
Creatinine, Ser: 0.84 mg/dL (ref 0.40–1.20)
GFR: 71.09 mL/min (ref >60.00)
Glucose, Bld: 95 mg/dL (ref 70–99)
Potassium: 4.7 mEq/L (ref 3.5–5.1)
Sodium: 142 mEq/L (ref 135–145)
Total Bilirubin: 0.7 mg/dL (ref 0.2–1.2)
Total Protein: 6.7 g/dL (ref 6.0–8.3)

## 2021-03-29 LAB — LIPID PANEL
Cholesterol: 195 mg/dL (ref 0–200)
HDL: 100.3 mg/dL (ref >39.00)
LDL Cholesterol: 81 mg/dL (ref 0–99)
NonHDL: 94.21
Total CHOL/HDL Ratio: 2
Triglycerides: 64 mg/dL (ref 0.0–149.0)
VLDL: 12.8 mg/dL (ref 0.0–40.0)

## 2021-03-29 MED ORDER — ROPINIROLE HCL 0.5 MG PO TABS: ORAL_TABLET | ORAL | 3 refills | Status: DC

## 2021-03-29 MED ORDER — ATORVASTATIN CALCIUM 10 MG PO TABS: 10.0000 mg | ORAL_TABLET | Freq: Every day | ORAL | 3 refills | Status: DC

## 2021-03-29 NOTE — Patient Instructions (Signed)
Preventive Care 65 Years and Older, Female °Preventive care refers to lifestyle choices and visits with your health care provider that can promote health and wellness. Preventive care visits are also called wellness exams. °What can I expect for my preventive care visit? °Counseling °Your health care provider may ask you questions about your: °Medical history, including: °Past medical problems. °Family medical history. °Pregnancy and menstrual history. °History of falls. °Current health, including: °Memory and ability to understand (cognition). °Emotional well-being. °Home life and relationship well-being. °Sexual activity and sexual health. °Lifestyle, including: °Alcohol, nicotine or tobacco, and drug use. °Access to firearms. °Diet, exercise, and sleep habits. °Work and work environment. °Sunscreen use. °Safety issues such as seatbelt and bike helmet use. °Physical exam °Your health care provider will check your: °Height and weight. These may be used to calculate your BMI (body mass index). BMI is a measurement that tells if you are at a healthy weight. °Waist circumference. This measures the distance around your waistline. This measurement also tells if you are at a healthy weight and may help predict your risk of certain diseases, such as type 2 diabetes and high blood pressure. °Heart rate and blood pressure. °Body temperature. °Skin for abnormal spots. °What immunizations do I need? °Vaccines are usually given at various ages, according to a schedule. Your health care provider will recommend vaccines for you based on your age, medical history, and lifestyle or other factors, such as travel or where you work. °What tests do I need? °Screening °Your health care provider may recommend screening tests for certain conditions. This may include: °Lipid and cholesterol levels. °Hepatitis C test. °Hepatitis B test. °HIV (human immunodeficiency virus) test. °STI (sexually transmitted infection) testing, if you are at  risk. °Lung cancer screening. °Colorectal cancer screening. °Diabetes screening. This is done by checking your blood sugar (glucose) after you have not eaten for a while (fasting). °Mammogram. Talk with your health care provider about how often you should have regular mammograms. °BRCA-related cancer screening. This may be done if you have a family history of breast, ovarian, tubal, or peritoneal cancers. °Bone density scan. This is done to screen for osteoporosis. °Talk with your health care provider about your test results, treatment options, and if necessary, the need for more tests. °Follow these instructions at home: °Eating and drinking ° °Eat a diet that includes fresh fruits and vegetables, whole grains, lean protein, and low-fat dairy products. Limit your intake of foods with high amounts of sugar, saturated fats, and salt. °Take vitamin and mineral supplements as recommended by your health care provider. °Do not drink alcohol if your health care provider tells you not to drink. °If you drink alcohol: °Limit how much you have to 0-1 drink a day. °Know how much alcohol is in your drink. In the [place] one drink equals one 12 oz bottle of beer (355 mL), one 5 oz glass of wine (148 mL), or one 1½ oz glass of hard liquor (44 mL). °Lifestyle °Brush your teeth every morning and night with fluoride toothpaste. Floss one time each day. °Exercise for at least 30 minutes 5 or more days each week. °Do not use any products that contain nicotine or tobacco. These products include cigarettes, chewing tobacco, and vaping devices, such as e-cigarettes. If you need help quitting, ask your health care provider. °Do not use drugs. °If you are sexually active, practice safe sex. Use a condom or other form of protection in order to prevent STIs. °Take aspirin only as told by your   health care provider. Make sure that you understand how much to take and what form to take. Work with your health care provider to find out whether it  is safe and beneficial for you to take aspirin daily. Ask your health care provider if you need to take a cholesterol-lowering medicine (statin). Find healthy ways to manage stress, such as: Meditation, yoga, or listening to music. Journaling. Talking to a trusted person. Spending time with friends and family. Minimize exposure to UV radiation to reduce your risk of skin cancer. Safety Always wear your seat belt while driving or riding in a vehicle. Do not drive: If you have been drinking alcohol. Do not ride with someone who has been drinking. When you are tired or distracted. While texting. If you have been using any mind-altering substances or drugs. Wear a helmet and other protective equipment during sports activities. If you have firearms in your house, make sure you follow all gun safety procedures. What's next? Visit your health care provider once a year for an annual wellness visit. Ask your health care provider how often you should have your eyes and teeth checked. Stay up to date on all vaccines. This information is not intended to replace advice given to you by your health care provider. Make sure you discuss any questions you have with your health care provider. Document Revised: 08/23/2020 Document Reviewed: 08/23/2020 Elsevier Patient Education  [place].

## 2021-03-29 NOTE — Progress Notes (Addendum)
Subjective:     Stephanie [name] is a 71 y.o. female and is here for a comprehensive physical exam. The patient reports problems with weakness in hands from inflammatory arthritis.  She needs to make a f/u with rheum. She also needs refills on her meds No other complaints   Social History   Socioeconomic History   Marital status: Married    Spouse name: Not on file   Number of children: 2   Years of education: Not on file   Highest education level: Not on file  Occupational History   Occupation: self  Tobacco Use   Smoking status: Never   Smokeless tobacco: Never  Vaping Use   Vaping Use: Never used  Substance and Sexual Activity   Alcohol use: Yes    Alcohol/week: 7.0 standard drinks    Types: 7 Standard drinks or equivalent per week   Drug use: No   Sexual activity: Yes    Partners: Male  Other Topics Concern   Not on file  Social History Narrative   Married, 1 son and 1 daughter   Self-employed   2 caffienated beverages/day   Social Determinants of Health   Financial Resource Strain: Not on file  Food Insecurity: Not on file  Transportation Needs: Not on file  Physical Activity: Not on file  Stress: Not on file  Social Connections: Not on file  Intimate Partner Violence: Not on file   Health Maintenance  Topic Date Due   Zoster Vaccines- Shingrix (1 of 2) Never done   MAMMOGRAM  05/06/2018   COLONOSCOPY (Pts 45-87yrs Insurance coverage will need to be confirmed)  08/14/2018   COVID-19 Vaccine (4 - Booster for Pfizer series) 02/25/2020   INFLUENZA VACCINE  10/09/2020   TETANUS/TDAP  05/13/2022   Pneumonia Vaccine 9+ Years old  Completed   DEXA SCAN  Completed   Hepatitis C Screening  Completed   HPV VACCINES  Aged Out    The following portions of the patient's history were reviewed and updated as appropriate: She  has a past medical history of Arthritis, Cataract, Complication of anesthesia, DDD (degenerative disc disease), GERD (gastroesophageal reflux  disease), colonic polyps (08/20/2016), Hyperlipidemia, and Lactose intolerance. She does not have any pertinent problems on file. She  has a past surgical history that includes Total abdominal hysterectomy w/ bilateral salpingoophorectomy; Colonoscopy; Cataract extraction (Bilateral, summer 2021); and Cesarean section. Her family history includes Alzheimer's disease in her mother; Arthritis in her brother, father, mother, and sister; Cystic fibrosis in an other family member; Dementia in her mother; Healthy in her daughter and son; Hypertension in her father and mother; Liver disease in her father, paternal aunt, paternal aunt, paternal aunt, and sister. She  reports that she has never smoked. She has never used smokeless tobacco. She reports current alcohol use of about 7.0 standard drinks per week. She reports that she does not use drugs. She has a current medication list which includes the following prescription(s): doxylamine (sleep), hydroxychloroquine, prednisone, atorvastatin, and ropinirole, and the following Facility-Administered Medications: sodium chloride. Current Outpatient Medications on File Prior to Visit  Medication Sig Dispense Refill   doxylamine, Sleep, (UNISOM) 25 MG tablet Take 25 mg by mouth at bedtime as needed.     hydroxychloroquine (PLAQUENIL) 200 MG tablet Take 1.5 tablets (300 mg total) by mouth daily. 45 tablet 2   predniSONE (DELTASONE) 5 MG tablet Take 1 tablet (5 mg total) by mouth daily with breakfast. (Patient taking differently: Take 10 mg by mouth  daily with breakfast.) 30 tablet 1   Current Facility-Administered Medications on File Prior to Visit  Medication Dose Route Frequency Provider Last Rate Last Admin   0.9 %  sodium chloride infusion  500 mL Intravenous Continuous Gatha Mayer, MD       She is allergic to penicillins..  Review of Systems Review of Systems  Constitutional: Negative for activity change, appetite change and fatigue.  HENT: Negative  for hearing loss, congestion, tinnitus and ear discharge.  dentist q48m Eyes: Negative for visual disturbance (see optho q1y -- vision corrected to 20/20 with glasses).  Respiratory: Negative for cough, chest tightness and shortness of breath.   Cardiovascular: Negative for chest pain, palpitations and leg swelling.  Gastrointestinal: Negative for abdominal pain, diarrhea, constipation and abdominal distention.  Genitourinary: Negative for urgency, frequency, decreased urine volume and difficulty urinating.  Musculoskeletal: Negative for back pain, arthralgias and gait problem.  Skin: Negative for color change, pallor and rash.  Neurological: Negative for dizziness, light-headedness, numbness and headaches.  Hematological: Negative for adenopathy. Does not bruise/bleed easily.  Psychiatric/Behavioral: Negative for suicidal ideas, confusion, sleep disturbance, self-injury, dysphoric mood, decreased concentration and agitation.      Objective:    BP 130/84 (BP Location: Left Arm, Patient Position: Sitting, Cuff Size: Normal)    Pulse 76    Temp 97.8 F (36.6 C) (Oral)    Resp 18    Ht 5\' 3"  (1.6 m)    Wt 169 lb 6.4 oz (76.8 kg)    SpO2 97%    BMI 30.01 kg/m  General appearance: alert, cooperative, appears stated age, and no distress Head: Normocephalic, without obvious abnormality, atraumatic Eyes: negative findings: lids and lashes normal, conjunctivae and sclerae normal, and pupils equal, round, reactive to light and accomodation Ears: normal TM's and external ear canals both ears Nose: Nares normal. Septum midline. Mucosa normal. No drainage or sinus tenderness. Throat: lips, mucosa, and tongue normal; teeth and gums normal Neck: no adenopathy, no carotid bruit, no JVD, supple, symmetrical, trachea midline, and thyroid not enlarged, symmetric, no tenderness/mass/nodules Back: symmetric, no curvature. ROM normal. No CVA tenderness. Lungs: clear to auscultation bilaterally Breasts: normal  appearance, no masses or tenderness Heart: regular rate and rhythm, S1, S2 normal, no murmur, click, rub or gallop Abdomen: soft, non-tender; bowel sounds normal; no masses,  no organomegaly Extremities: extremities normal, atraumatic, no cyanosis or edema Pulses: 2+ and symmetric Skin: Skin color, texture, turgor normal. No rashes or lesions Lymph nodes: Cervical, supraclavicular, and axillary nodes normal. Neurologic: Alert and oriented X 3, normal strength and tone. Normal symmetric reflexes. Normal coordination and gait    Assessment:    Healthy female exam.      Plan:    Ghm utd Check labs  See After Visit Summary for Counseling Recommendations   1. Need for influenza vaccination  - Flu Vaccine QUAD High Dose(Fluad)  2. Colon cancer screening  - Ambulatory referral to Gastroenterology  3. Estrogen deficiency  - DG Bone Density; Future  4. RLS (restless legs syndrome) Stable , refill meds  - rOPINIRole (REQUIP) 0.5 MG tablet; 3 po qd  Dispense: 270 tablet; Refill: 3  5. Hyperlipidemia, unspecified hyperlipidemia type Tolerating statin, encouraged heart healthy diet, avoid trans fats, minimize simple carbs and saturated fats. Increase exercise as tolerated  - atorvastatin (LIPITOR) 10 MG tablet; Take 1 tablet (10 mg total) by mouth daily.  Dispense: 90 tablet; Refill: 3 - CBC with Differential/Platelet - Comprehensive metabolic panel - Lipid panel  6. Inflammatory arthritis Con't f/u with rheum Refer to OT due to dec strength in hands  - Ambulatory referral to Occupational Therapy

## 2021-04-02 ENCOUNTER — Encounter (HOSPITAL_BASED_OUTPATIENT_CLINIC_OR_DEPARTMENT_OTHER): Payer: Self-pay

## 2021-04-02 ENCOUNTER — Ambulatory Visit (HOSPITAL_BASED_OUTPATIENT_CLINIC_OR_DEPARTMENT_OTHER)
Admission: RE | Admit: 2021-04-02 | Discharge: 2021-04-02 | Disposition: A | Discharge status: Home / Self Care | Payer: PPO | Source: Ambulatory Visit | Attending: Family Medicine | Admitting: Family Medicine

## 2021-04-02 ENCOUNTER — Other Ambulatory Visit: Payer: Self-pay

## 2021-04-02 DIAGNOSIS — M8588 Other specified disorders of bone density and structure, other site: Secondary | ICD-10-CM | POA: Diagnosis not present

## 2021-04-02 DIAGNOSIS — Z1231 Encounter for screening mammogram for malignant neoplasm of breast: Secondary | ICD-10-CM

## 2021-04-02 DIAGNOSIS — E2839 Other primary ovarian failure: Secondary | ICD-10-CM

## 2021-04-09 ENCOUNTER — Other Ambulatory Visit: Payer: Self-pay | Admitting: Internal Medicine

## 2021-04-09 DIAGNOSIS — M1380 Other specified arthritis, unspecified site: Secondary | ICD-10-CM

## 2021-04-09 DIAGNOSIS — M1990 Unspecified osteoarthritis, unspecified site: Secondary | ICD-10-CM

## 2021-04-10 ENCOUNTER — Encounter: Payer: Self-pay | Admitting: Occupational Therapy

## 2021-04-10 ENCOUNTER — Other Ambulatory Visit: Payer: Self-pay

## 2021-04-10 ENCOUNTER — Ambulatory Visit: Payer: PPO | Attending: Family Medicine | Admitting: Occupational Therapy

## 2021-04-10 DIAGNOSIS — R600 Localized edema: Secondary | ICD-10-CM | POA: Diagnosis not present

## 2021-04-10 DIAGNOSIS — R278 Other lack of coordination: Secondary | ICD-10-CM | POA: Insufficient documentation

## 2021-04-10 DIAGNOSIS — M25532 Pain in left wrist: Secondary | ICD-10-CM | POA: Diagnosis not present

## 2021-04-10 DIAGNOSIS — R2681 Unsteadiness on feet: Secondary | ICD-10-CM | POA: Insufficient documentation

## 2021-04-10 DIAGNOSIS — M6281 Muscle weakness (generalized): Secondary | ICD-10-CM | POA: Diagnosis not present

## 2021-04-10 DIAGNOSIS — M25531 Pain in right wrist: Secondary | ICD-10-CM | POA: Diagnosis not present

## 2021-04-10 NOTE — Therapy (Signed)
[facility] [address], [place], [place], [address] Phone: [phone]   Fax:  [phone]  Occupational Therapy Evaluation  Patient Details  Name: Stephanie [name] MRN: [id number] Date of Birth: 01/08/1951 Referring Provider (OT): Carollee Herter, [place], [place]   Encounter Date: 04/10/2021   OT End of Session - 04/10/21 1434     Visit Number 1    Number of Visits 9    Date for OT Re-Evaluation 06/08/21    Authorization Type [employer]    OT Start Time 1109    OT Stop Time 1151    OT Time Calculation (min) 42 min    Activity Tolerance Patient tolerated treatment well    Behavior During Therapy [facility] for tasks assessed/performed             Past Medical History:  Diagnosis Date   Arthritis    Cataract    beginning signs of cataract   Complication of anesthesia    Per pt, hard to wake up past c-section/ 1 time.   DDD (degenerative disc disease)    in feet and hands   GERD (gastroesophageal reflux disease)    Hx of colonic polyps 08/20/2016   Hyperlipidemia    Lactose intolerance     Past Surgical History:  Procedure Laterality Date   CATARACT EXTRACTION Bilateral summer 2021   [facility]   [place] W/ BILATERAL SALPINGOOPHORECTOMY      There were no vitals filed for this visit.   Subjective Assessment - 04/10/21 1424     Subjective  Pt presents to OT with reports of difficulty holding coffee cup, preparing vegetables (peeling potatoes), and difficulty carrying heavy water jug to water plants.    Pertinent History osteoarthritis, GERD, hyperlipidemia    Currently in Pain? No/denies               [facility] OT Assessment - 04/10/21 1116       Assessment   Medical Diagnosis Inflammatory arthritis    Referring Provider (OT) [name], DO    Onset Date/Surgical Date 03/29/21   MD appt - however pt reports issues going on ~ 1  year   Hand Dominance Right    Next MD Visit 09/27/2021    Prior Therapy no      Balance Screen   Has the patient fallen in the past 6 months No    Has the patient had a decrease in activity level because of a fear of falling?  No    Is the patient reluctant to leave their home because of a fear of falling?  No      Home  Environment   Family/patient expects to be discharged to: Private residence    Living Arrangements Spouse/significant other    Available Help at Discharge Family    Type of [place]   1 or 2 steps to enter depending on entrance   [place] One level    [employer]er, retail held shower head, shower chair   [place] -quad;Crutches;Shower seat   if plantar fasciatis flares up     Prior Function   Level of Independence Independent      ADL   Eating/Feeding --   pain with preparing vegetables   Upper Body  Dressing --   difficulty with fasteners (bra), buttons   Lower Body Dressing --   hand weakness impacting donning socks.   ADL comments Pt reports no difficulty with bathroom transfers, bathing, or dressing with exception of difficulty with fasteners, donning socks.      IADL   Shopping Takes care of all shopping needs independently    Light Housekeeping Does personal laundry completely;Maintains house alone or with occasional assistance    Meal Prep --   some pain with prepping vegetables   Medication Management Is responsible for taking medication in correct dosages at correct time    Financial Management Manages day-to-day purchases, but needs help with banking, major purchases, etc.      Written Expression   Dominant Hand Right    Handwriting 100% legible;Increased time   reports increased effort     Vision - History   Baseline Vision Wears contact    Visual History Cataracts   had catarct surgery     Activity Tolerance   Activity Tolerance Endurance does not limit  participation in activity      Sensation   Light Touch Appears Intact      Coordination   9 Hole Peg Test Right;Left    Right 9 Hole Peg Test 22.4    Left 9 Hole Peg Test 24.9    Box and Blocks R: 56 and L: 57      ROM / Strength   AROM / PROM / Strength AROM;Strength      AROM   Overall AROM  Within functional limits for tasks performed;Deficits   BUE WFL, except wrist flexion/extension   AROM Assessment Site Wrist    Right/Left Wrist Right    Right Wrist Extension 45 Degrees    Right Wrist Flexion 75 Degrees    Left Wrist Extension 35 Degrees    Left Wrist Flexion 70 Degrees      Strength   Overall Strength Within functional limits for tasks performed;Due to precautions   WFL shoulder and elbow, decreased wrist flexion/extension and decreased pinch     Hand Function   Right Hand Grip (lbs) 29    Right Hand Lateral Pinch 6 lbs    Right Hand 3 Point Pinch 5 lbs    Left Hand Grip (lbs) 30    Left Hand Lateral Pinch 3 lbs    Left 3 point pinch 3 lbs                              OT Education - 04/10/21 1434     Education Details educated on OT purpose, POC, and goals    Person(s) Educated Patient    Methods Explanation    Comprehension Verbalized understanding              OT Short Term Goals - 04/10/21 1534       OT SHORT TERM GOAL #1   Title Pt will be independent in [facility] and strengthening HEP to incresae functional grasp during self-care tasks.    Time 4    Period Weeks    Status New    Target Date 05/10/21      OT SHORT TERM GOAL #2   Title Pt will explore options for DME/AE to increase independence and ease pain with self-care and leisure tasks.    Time 4    Period Weeks    Status New  OT Long Term Goals - 04/10/21 1537       OT LONG TERM GOAL #1   Title Pt will demonstrate increased lateral pinch and tip to tip pinch by 2# bilaterally to increase independence with managing fasteners.    Time 8    Period  Weeks    Status New    Target Date 06/09/21      OT LONG TERM GOAL #2   Title Pt will be able to get up from stooped position with improved ease, per pt report, to allow pt to engage in gardening tasks.    Time 8    Period Weeks    Status New      OT LONG TERM GOAL #3   Title Pt will demonstrate increased wrist flexion by 10* bilaterally to increase independence with managing water jug in hand while pouring.    Baseline R: 35* and L: 45*    Time 8    Period Weeks      OT LONG TERM GOAL #4   Title Pt will demonstrate sustained grasp in B hands to maintain grasp on coffee mug without pain or droppping mug while ambulating 10' to simulate walking in home environment.    Time 8    Period Weeks    Status New                   Plan - 04/10/21 1435     Clinical Impression Statement Pt is a 71 y/o female who presents to OP OT due to imflammatory arthritis impacting her ability to engage in ADL tasks such as donning socks or other tight fitting clothes, fastening buttons and bra hook, and engaging in leisure tasks of gardening and watering plants. Pt currently lives with spouse in a one level home with 2 steps to enter. PMHx includes osteoarthritis, GERD hyperlipidemia. Pt will benefit from skilled occupational therapy services to address strength, ROM, pain management, balance, GM/FM control, safety awareness, introduction of compensatory strategies/AE prn, and implementation of an HEP to improve participation and safety during ADLs and IADLs and leisure pursuits.    OT Occupational Profile and History Detailed Assessment- Review of Records and additional review of physical, cognitive, psychosocial history related to current functional performance    Occupational performance deficits (Please refer to evaluation for details): ADL's;IADL's;Leisure    Body Structure / Function / Physical Skills ADL;Balance;Body mechanics;Coordination;Decreased knowledge of use of  DME;Dexterity;Edema;Endurance;Flexibility;FMC;GMC;IADL;Mobility;Pain;ROM;Strength;UE functional use    Rehab Potential Good    Clinical Decision Making Limited treatment options, no task modification necessary    Comorbidities Affecting Occupational Performance: May have comorbidities impacting occupational performance    Modification or Assistance to Complete Evaluation  No modification of tasks or assist necessary to complete eval    OT Frequency 1x / week    OT Duration 8 weeks    OT Treatment/Interventions Self-care/ADL training;Biofeedback;Cryotherapy;Moist Heat;Electrical Stimulation;Ultrasound;Iontophoresis;Therapeutic exercise;Neuromuscular education;Energy conservation;DME and/or AE instruction;Functional Mobility Training;Manual Therapy;Passive range of motion;Splinting;Therapeutic activities;Patient/family education;Balance training    Plan wrist flexion/extension, grip and pinch strength, provide with theraputty HEP with focus on wrist and thumb mobility    Consulted and Agree with Plan of Care Patient             Patient will benefit from skilled therapeutic intervention in order to improve the following deficits and impairments:   Body Structure / Function / Physical Skills: ADL, Balance, Body mechanics, Coordination, Decreased knowledge of use of DME, Dexterity, Edema, Endurance, Flexibility, FMC, GMC, IADL, Mobility, Pain, ROM,  Strength, UE functional use       Visit Diagnosis: Muscle weakness (generalized)  Localized edema  Unsteadiness on feet  Other lack of coordination  Pain in left wrist  Pain in right wrist    Problem List Patient Active Problem List   Diagnosis Date Noted   SOB (shortness of breath) 09/25/2020   Inflammatory arthritis 08/09/2020   Positive ANA (antinuclear antibody) 07/11/2020   Bilateral wrist pain 07/11/2020   Fatigue 06/02/2020   Dizziness 06/02/2020   Arthralgia 06/02/2020   Palpitations 06/02/2020   Basal cell carcinoma  (BCC) of forehead 04/24/2020   Lactose intolerance    Snoring 05/16/2018   OSA (obstructive sleep apnea) 05/16/2018   Restless legs 01/26/2018   Insomnia 01/26/2018   Snores 01/26/2018   Hx of colonic polyps 08/20/2016   BACK PAIN 02/29/2008   Hyperlipidemia 04/23/2007   Osteoarthritis 04/23/2007   [place] DISEASE, LUMBAR 04/23/2007    Simonne Come, OT 04/10/2021, 3:44 PM  [facility] [place] [address] [place], [place], [address] Phone: [phone]   Fax:  [phone]  Name: Stephanie [name] MRN: [id number] Date of Birth: 10/18/1950

## 2021-04-10 NOTE — Telephone Encounter (Signed)
Next Visit: not scheduled  Last Visit: 01/03/2021  Labs: 03/29/2021 WNL  Eye exam: not on file   Current Dose per office note 01/03/2021: hydroxychloroquine 300 mg daily   [id number] arthritis   Last Fill: 01/03/2021  Okay to refill Plaquenil? When do you want patient to follow up?

## 2021-04-13 ENCOUNTER — Other Ambulatory Visit: Payer: Self-pay | Admitting: *Deleted

## 2021-04-13 NOTE — Telephone Encounter (Signed)
Yes we can refill. She also just started with rehab so would like to see if there is any progress from this. She can follow up in 6-8 weeks. She does need an eye exam.

## 2021-04-13 NOTE — Telephone Encounter (Signed)
Error

## 2021-04-16 ENCOUNTER — Encounter: Payer: Self-pay | Admitting: Internal Medicine

## 2021-04-16 NOTE — Telephone Encounter (Signed)
LMOM for patient to call and schedule follow-up appointment.   °

## 2021-04-18 ENCOUNTER — Ambulatory Visit: Payer: PPO | Attending: Family Medicine | Admitting: Occupational Therapy

## 2021-04-18 ENCOUNTER — Other Ambulatory Visit: Payer: Self-pay

## 2021-04-18 ENCOUNTER — Encounter: Payer: Self-pay | Admitting: Occupational Therapy

## 2021-04-18 DIAGNOSIS — R278 Other lack of coordination: Secondary | ICD-10-CM | POA: Diagnosis not present

## 2021-04-18 DIAGNOSIS — R600 Localized edema: Secondary | ICD-10-CM | POA: Diagnosis not present

## 2021-04-18 DIAGNOSIS — R2681 Unsteadiness on feet: Secondary | ICD-10-CM | POA: Insufficient documentation

## 2021-04-18 DIAGNOSIS — M25531 Pain in right wrist: Secondary | ICD-10-CM | POA: Insufficient documentation

## 2021-04-18 DIAGNOSIS — M25532 Pain in left wrist: Secondary | ICD-10-CM | POA: Insufficient documentation

## 2021-04-18 DIAGNOSIS — M6281 Muscle weakness (generalized): Secondary | ICD-10-CM | POA: Diagnosis not present

## 2021-04-18 NOTE — Patient Instructions (Signed)
Access Code: [email or web address] URL: [email or web address] Date: 04/18/2021 Prepared by: [facility]  Exercises Putty Squeezes - 1 x daily - 3 x weekly - 1 sets - 10 reps Rolling Putty on Table - 1 x daily - 3 x weekly - 1 sets - 10 reps Thumb Opposition with Putty - 1 x daily - 3 x weekly - 1 sets - 10 reps 3-Point Pinch with Putty - 1 x daily - 3 x weekly - 1 sets - 10 reps Key Pinch with Putty - 1 x daily - 3 x weekly - 1 sets - 10 reps Thumb MCP and IP Flexion with Putty - 1 x daily - 3 x weekly - 1 sets - 10 reps

## 2021-04-18 NOTE — Therapy (Signed)
[facility] [address], [place], [place], [address] Phone: [phone]   Fax:  [phone]  Occupational Therapy Treatment  Patient Details  Name: LAVORIS CANIZALES MRN: [id number] Date of Birth: December 14, 1950 Referring Provider (OT): Carollee Herter, [place], [place]   Encounter Date: 04/18/2021   OT End of Session - 04/18/21 0940     Visit Number 2    Number of Visits 9    Date for OT Re-Evaluation 06/08/21    Authorization Type [employer]    OT Start Time 0935    OT Stop Time 1017    OT Time Calculation (min) 42 min    Activity Tolerance Patient tolerated treatment well    Behavior During Therapy [facility] for tasks assessed/performed             Past Medical History:  Diagnosis Date   Arthritis    Cataract    beginning signs of cataract   Complication of anesthesia    Per pt, hard to wake up past c-section/ 1 time.   DDD (degenerative disc disease)    in feet and hands   GERD (gastroesophageal reflux disease)    Hx of colonic polyps 08/20/2016   Hyperlipidemia    Lactose intolerance     Past Surgical History:  Procedure Laterality Date   CATARACT EXTRACTION Bilateral summer 2021   hecker   [place] W/ BILATERAL SALPINGOOPHORECTOMY      There were no vitals filed for this visit.   Subjective Assessment - 04/18/21 0936     Subjective  Pt reports pain in MCP in index and long finger with pinch, noting "most inflammation" in that area.    Pertinent History osteoarthritis, GERD, hyperlipidemia    Currently in Pain? No/denies            Digiflex with (light resistance) with focus on alternating finger flexion and full hand flexion against light resistance.  Completed bilaterally.  Pt with increased difficulty with maintaining index finger on gripper with full hand squeeze when completed on R.     Educated on theraputty HEP with focus on wrist  flexion/extension, thumb opposition, and variety of pinches. Pt required initial cues for technique especially with thumb MCP and IP flexion exercise. Provided with printed handout.  Access Code: [email or web address] URL: [email or web address] Date: 04/18/2021 Prepared by: [facility]  Exercises Putty Squeezes - 1 x daily - 3 x weekly - 1 sets - 10 reps Rolling Putty on Table - 1 x daily - 3 x weekly - 1 sets - 10 reps Thumb Opposition with Putty - 1 x daily - 3 x weekly - 1 sets - 10 reps 3-Point Pinch with Putty - 1 x daily - 3 x weekly - 1 sets - 10 reps Key Pinch with Putty - 1 x daily - 3 x weekly - 1 sets - 10 reps Thumb MCP and IP Flexion with Putty - 1 x daily - 3 x weekly - 1 sets - 10 reps                       OT Short Term Goals - 04/18/21 0955       OT SHORT TERM GOAL #1   Title Pt will be independent in [facility] and strengthening HEP to incresae functional grasp during self-care tasks.  Time 4    Period Weeks    Status On-going    Target Date 05/10/21      OT SHORT TERM GOAL #2   Title Pt will explore options for DME/AE to increase independence and ease pain with self-care and leisure tasks.    Time 4    Period Weeks    Status On-going               OT Long Term Goals - 04/18/21 0955       OT LONG TERM GOAL #1   Title Pt will demonstrate increased lateral pinch and tip to tip pinch by 2# bilaterally to increase independence with managing fasteners.    Time 8    Period Weeks    Status On-going    Target Date 06/09/21      OT LONG TERM GOAL #2   Title Pt will be able to get up from stooped position with improved ease, per pt report, to allow pt to engage in gardening tasks.    Time 8    Period Weeks    Status On-going      OT LONG TERM GOAL #3   Title Pt will demonstrate increased wrist flexion by 10* bilaterally to increase independence with managing water jug in hand while pouring.    Baseline  R: 35* and L: 45*    Time 8    Period Weeks    Status On-going      OT LONG TERM GOAL #4   Title Pt will demonstrate sustained grasp in B hands to maintain grasp on coffee mug without pain or droppping mug while ambulating 10' to simulate walking in home environment.    Time 8    Period Weeks    Status On-going                   Plan - 04/18/21 0940     Clinical Impression Statement Pt responsive to theraputty exercises with focus on functional pinch and grip strength to facilitate improved grasp to hold coffee mug and pour from jug.  Pt reports mild increased pain with 3 jaw chuck during pinch task with theraputty due to increased inflammation in index and long finger at MCP.  Pt tolerated digiflex with no increase in pain at MCPs.    OT Occupational Profile and History Detailed Assessment- Review of Records and additional review of physical, cognitive, psychosocial history related to current functional performance    Occupational performance deficits (Please refer to evaluation for details): ADL's;IADL's;Leisure    Body Structure / Function / Physical Skills ADL;Balance;Body mechanics;Coordination;Decreased knowledge of use of DME;Dexterity;Edema;Endurance;Flexibility;FMC;GMC;IADL;Mobility;Pain;ROM;Strength;UE functional use    Rehab Potential Good    Clinical Decision Making Limited treatment options, no task modification necessary    Comorbidities Affecting Occupational Performance: May have comorbidities impacting occupational performance    Modification or Assistance to Complete Evaluation  No modification of tasks or assist necessary to complete eval    OT Frequency 1x / week    OT Duration 8 weeks    OT Treatment/Interventions Self-care/ADL training;Biofeedback;Cryotherapy;Moist Heat;Electrical Stimulation;Ultrasound;Iontophoresis;Therapeutic exercise;Neuromuscular education;Energy conservation;DME and/or AE instruction;Functional Mobility Training;Manual Therapy;Passive  range of motion;Splinting;Therapeutic activities;Patient/family education;Balance training    Plan wrist movement to hold coffee mug and pour water jug, add to theraputty HEP with thumb mobility    OT Home Exercise Plan [place] and Agree with Plan of Care Patient             Patient will  benefit from skilled therapeutic intervention in order to improve the following deficits and impairments:   Body Structure / Function / Physical Skills: ADL, Balance, Body mechanics, Coordination, Decreased knowledge of use of DME, Dexterity, Edema, Endurance, Flexibility, FMC, GMC, IADL, Mobility, Pain, ROM, Strength, UE functional use       Visit Diagnosis: Muscle weakness (generalized)  Other lack of coordination  Localized edema  Pain in left wrist  Pain in right wrist    Problem List Patient Active Problem List   Diagnosis Date Noted   SOB (shortness of breath) 09/25/2020   Inflammatory arthritis 08/09/2020   Positive ANA (antinuclear antibody) 07/11/2020   Bilateral wrist pain 07/11/2020   Fatigue 06/02/2020   Dizziness 06/02/2020   Arthralgia 06/02/2020   Palpitations 06/02/2020   Basal cell carcinoma (BCC) of forehead 04/24/2020   Lactose intolerance    Snoring 05/16/2018   OSA (obstructive sleep apnea) 05/16/2018   Restless legs 01/26/2018   Insomnia 01/26/2018   Snores 01/26/2018   Hx of colonic polyps 08/20/2016   BACK PAIN 02/29/2008   Hyperlipidemia 04/23/2007   Osteoarthritis 04/23/2007   [place] DISEASE, LUMBAR 04/23/2007    Simonne Come, OT 04/18/2021, 12:03 PM  [facility] [address] [place], [place], [address] Phone: [phone]   Fax:  [phone]  Name: SHARLEE RUFINO MRN: [id number] Date of Birth: 05/14/1950

## 2021-04-24 ENCOUNTER — Other Ambulatory Visit: Payer: Self-pay

## 2021-04-24 ENCOUNTER — Ambulatory Visit: Payer: PPO | Admitting: Occupational Therapy

## 2021-04-24 ENCOUNTER — Encounter: Payer: Self-pay | Admitting: Occupational Therapy

## 2021-04-24 ENCOUNTER — Ambulatory Visit (INDEPENDENT_AMBULATORY_CARE_PROVIDER_SITE_OTHER): Payer: PPO

## 2021-04-24 VITALS — Ht 63.0 in | Wt 169.0 lb

## 2021-04-24 DIAGNOSIS — Z0000 Encounter for general adult medical examination without abnormal findings: Secondary | ICD-10-CM | POA: Diagnosis not present

## 2021-04-24 DIAGNOSIS — M25532 Pain in left wrist: Secondary | ICD-10-CM

## 2021-04-24 DIAGNOSIS — M6281 Muscle weakness (generalized): Secondary | ICD-10-CM | POA: Diagnosis not present

## 2021-04-24 DIAGNOSIS — R600 Localized edema: Secondary | ICD-10-CM

## 2021-04-24 DIAGNOSIS — R2681 Unsteadiness on feet: Secondary | ICD-10-CM

## 2021-04-24 DIAGNOSIS — R278 Other lack of coordination: Secondary | ICD-10-CM

## 2021-04-24 DIAGNOSIS — M25531 Pain in right wrist: Secondary | ICD-10-CM

## 2021-04-24 NOTE — Patient Instructions (Signed)
Stephanie [name] , Thank you for taking time to complete your Medicare Wellness Visit. I appreciate your ongoing commitment to your health goals. Please review the following plan we discussed and let me know if I can assist you in the future.   Screening recommendations/referrals: Colonoscopy: Per our conversation, you already have an appt scheduled. Mammogram: Completed 04/02/2021-Due 04/02/2022 Bone Density: Completed 04/02/2021-Due 04/03/2023 Recommended yearly ophthalmology/optometry visit for glaucoma screening and checkup Recommended yearly dental visit for hygiene and checkup  Vaccinations: Influenza vaccine: Up to date Pneumococcal vaccine: Up to date Tdap vaccine: Up to date Shingles vaccine: May obtain vaccine at your local pharmacy. Covid-19:Booster available at the pharmacy  Advanced directives: Copy in chart  Conditions/risks identified: See problem list  Next appointment: Follow up in one year for your annual wellness visit    Preventive Care 65 Years and Older, Female Preventive care refers to lifestyle choices and visits with your health care provider that can promote health and wellness. What does preventive care include? A yearly physical exam. This is also called an annual well check. Dental exams once or twice a year. Routine eye exams. Ask your health care provider how often you should have your eyes checked. Personal lifestyle choices, including: Daily care of your teeth and gums. Regular physical activity. Eating a healthy diet. Avoiding tobacco and drug use. Limiting alcohol use. Practicing safe sex. Taking low-dose aspirin every day. Taking vitamin and mineral supplements as recommended by your health care provider. What happens during an annual well check? The services and screenings done by your health care provider during your annual well check will depend on your age, overall health, lifestyle risk factors, and family history of disease. Counseling  Your  health care provider may ask you questions about your: Alcohol use. Tobacco use. Drug use. Emotional well-being. Home and relationship well-being. Sexual activity. Eating habits. History of falls. Memory and ability to understand (cognition). Work and work Statistician. Reproductive health. Screening  You may have the following tests or measurements: Height, weight, and BMI. Blood pressure. Lipid and cholesterol levels. These may be checked every 5 years, or more frequently if you are over 70 years old. Skin check. Lung cancer screening. You may have this screening every year starting at age 61 if you have a 30-pack-year history of smoking and currently smoke or have quit within the past 15 years. Fecal occult blood test (FOBT) of the stool. You may have this test every year starting at age 56. Flexible sigmoidoscopy or colonoscopy. You may have a sigmoidoscopy every 5 years or a colonoscopy every 10 years starting at age 89. Hepatitis C blood test. Hepatitis B blood test. Sexually transmitted disease (STD) testing. Diabetes screening. This is done by checking your blood sugar (glucose) after you have not eaten for a while (fasting). You may have this done every 1-3 years. Bone density scan. This is done to screen for osteoporosis. You may have this done starting at age 22. Mammogram. This may be done every 1-2 years. Talk to your health care provider about how often you should have regular mammograms. Talk with your health care provider about your test results, treatment options, and if necessary, the need for more tests. Vaccines  Your health care provider may recommend certain vaccines, such as: Influenza vaccine. This is recommended every year. Tetanus, diphtheria, and acellular pertussis (Tdap, Td) vaccine. You may need a Td booster every 10 years. Zoster vaccine. You may need this after age 58. Pneumococcal 13-valent conjugate (PCV13) vaccine. One  dose is recommended after age  76. Pneumococcal polysaccharide (PPSV23) vaccine. One dose is recommended after age 94. Talk to your health care provider about which screenings and vaccines you need and how often you need them. This information is not intended to replace advice given to you by your health care provider. Make sure you discuss any questions you have with your health care provider. Document Released: 03/24/2015 Document Revised: 11/15/2015 Document Reviewed: 12/27/2014 Elsevier Interactive Patient Education  2017 [place] Prevention in the Home Falls can cause injuries. They can happen to people of all ages. There are many things you can do to make your home safe and to help prevent falls. What can I do on the outside of my home? Regularly fix the edges of walkways and driveways and fix any cracks. Remove anything that might make you trip as you walk through a door, such as a raised step or threshold. Trim any bushes or trees on the path to your home. Use bright outdoor lighting. Clear any walking paths of anything that might make someone trip, such as rocks or tools. Regularly check to see if handrails are loose or broken. Make sure that both sides of any steps have handrails. Any raised decks and porches should have guardrails on the edges. Have any leaves, snow, or ice cleared regularly. Use sand or salt on walking paths during winter. Clean up any spills in your garage right away. This includes oil or grease spills. What can I do in the bathroom? Use night lights. Install grab bars by the toilet and in the tub and shower. Do not use towel bars as grab bars. Use non-skid mats or decals in the tub or shower. If you need to sit down in the shower, use a plastic, non-slip stool. Keep the floor dry. Clean up any water that spills on the floor as soon as it happens. Remove soap buildup in the tub or shower regularly. Attach bath mats securely with double-sided non-slip rug tape. Do not have throw  rugs and other things on the floor that can make you trip. What can I do in the bedroom? Use night lights. Make sure that you have a light by your bed that is easy to reach. Do not use any sheets or blankets that are too big for your bed. They should not hang down onto the floor. Have a firm chair that has side arms. You can use this for support while you get dressed. Do not have throw rugs and other things on the floor that can make you trip. What can I do in the kitchen? Clean up any spills right away. Avoid walking on wet floors. Keep items that you use a lot in easy-to-reach places. If you need to reach something above you, use a strong step stool that has a grab bar. Keep electrical cords out of the way. Do not use floor polish or wax that makes floors slippery. If you must use wax, use non-skid floor wax. Do not have throw rugs and other things on the floor that can make you trip. What can I do with my stairs? Do not leave any items on the stairs. Make sure that there are handrails on both sides of the stairs and use them. Fix handrails that are broken or loose. Make sure that handrails are as long as the stairways. Check any carpeting to make sure that it is firmly attached to the stairs. Fix any carpet that is loose or worn.  Avoid having throw rugs at the top or bottom of the stairs. If you do have throw rugs, attach them to the floor with carpet tape. Make sure that you have a light switch at the top of the stairs and the bottom of the stairs. If you do not have them, ask someone to add them for you. What else can I do to help prevent falls? Wear shoes that: Do not have high heels. Have rubber bottoms. Are comfortable and fit you well. Are closed at the toe. Do not wear sandals. If you use a stepladder: Make sure that it is fully opened. Do not climb a closed stepladder. Make sure that both sides of the stepladder are locked into place. Ask someone to hold it for you, if  possible. Clearly mark and make sure that you can see: Any grab bars or handrails. First and last steps. Where the edge of each step is. Use tools that help you move around (mobility aids) if they are needed. These include: Canes. Walkers. Scooters. Crutches. Turn on the lights when you go into a dark area. Replace any light bulbs as soon as they burn out. Set up your furniture so you have a clear path. Avoid moving your furniture around. If any of your floors are uneven, fix them. If there are any pets around you, be aware of where they are. Review your medicines with your doctor. Some medicines can make you feel dizzy. This can increase your chance of falling. Ask your doctor what other things that you can do to help prevent falls. This information is not intended to replace advice given to you by your health care provider. Make sure you discuss any questions you have with your health care provider. Document Released: 12/22/2008 Document Revised: 08/03/2015 Document Reviewed: 04/01/2014 Elsevier Interactive Patient Education  2017 [employer].

## 2021-04-24 NOTE — Progress Notes (Signed)
Subjective:   Stephanie [name] is a 71 y.o. female who presents for Medicare Annual (Subsequent) preventive examination.    I connected with Cardelia today by telephone and verified that I am speaking with the correct person using two identifiers. Location patient: home Location provider: work Persons participating in the virtual visit: patient, Marine scientist.    I discussed the limitations, risks, security and privacy concerns of performing an evaluation and management service by telephone and the availability of in person appointments. I also discussed with the patient that there may be a patient responsible charge related to this service. The patient expressed understanding and verbally consented to this telephonic visit.    Interactive audio and video telecommunications were attempted between this provider and patient, however failed, due to patient having technical difficulties OR patient did not have access to video capability.  We continued and completed visit with audio only.  Some vital signs may be absent or patient reported.   Time Spent with patient on telephone encounter: 20 minutes  Review of Systems     Cardiac Risk Factors include: advanced age (>46men, >39 women);dyslipidemia     Objective:    Today's Vitals   04/24/21 1031  Weight: 169 lb (76.7 kg)  Height: 5\' 3"  (1.6 m)   Body mass index is 29.94 kg/m.  Advanced Directives 04/24/2021  Does Patient Have a Medical Advance Directive? Yes  Type of Paramedic of [place];Living will  Copy of [place] in Chart? No - copy requested    Current Medications (verified) Outpatient Encounter Medications as of 04/24/2021  Medication Sig   atorvastatin (LIPITOR) 10 MG tablet Take 1 tablet (10 mg total) by mouth daily.   doxylamine, Sleep, (UNISOM) 25 MG tablet Take 25 mg by mouth at bedtime as needed.   hydroxychloroquine (PLAQUENIL) 200 MG tablet TAKE 1 & 1/2 (ONE & ONE-HALF) TABLETS  BY MOUTH ONCE DAILY   predniSONE (DELTASONE) 5 MG tablet Take 1 tablet (5 mg total) by mouth daily with breakfast. (Patient taking differently: Take 10 mg by mouth daily with breakfast.)   rOPINIRole (REQUIP) 0.5 MG tablet 3 po qd   Facility-Administered Encounter Medications as of 04/24/2021  Medication   0.9 %  sodium chloride infusion    Allergies (verified) Penicillins   History: Past Medical History:  Diagnosis Date   Arthritis    Cataract    beginning signs of cataract   Complication of anesthesia    Per pt, hard to wake up past c-section/ 1 time.   DDD (degenerative disc disease)    in feet and hands   GERD (gastroesophageal reflux disease)    Hx of colonic polyps 08/20/2016   Hyperlipidemia    Lactose intolerance    Past Surgical History:  Procedure Laterality Date   CATARACT EXTRACTION Bilateral summer 2021   hecker   [place], 1982   COLONOSCOPY     TOTAL ABDOMINAL HYSTERECTOMY W/ BILATERAL SALPINGOOPHORECTOMY     Family History  Problem Relation Age of Onset   Hypertension Mother    Dementia Mother    Arthritis Mother    Alzheimer's disease Mother    Arthritis Father    Liver disease Father        NASH   Hypertension Father    Arthritis Sister    Liver disease Sister    Arthritis Brother    Liver disease Paternal Aunt    Liver disease Paternal Aunt  Liver disease Paternal Aunt    Cystic fibrosis Other        niece   Healthy Son    Healthy Daughter    Social History   Socioeconomic History   Marital status: Married    Spouse name: Not on file   Number of children: 2   Years of education: Not on file   Highest education level: Not on file  Occupational History   Occupation: self  Tobacco Use   Smoking status: Never   Smokeless tobacco: Never  Vaping Use   Vaping Use: Never used  Substance and Sexual Activity   Alcohol use: Yes    Alcohol/week: 7.0 standard drinks    Types: 7 Standard drinks or equivalent per week    Drug use: No   Sexual activity: Yes    Partners: Male  Other Topics Concern   Not on file  Social History Narrative   Married, 1 son and 1 daughter   Self-employed   2 caffienated beverages/day   Social Determinants of Health   Financial Resource Strain: Low Risk    Difficulty of Paying Living Expenses: Not hard at all  Food Insecurity: No Food Insecurity   Worried About Charity fundraiser in the Last Year: Never true   Arboriculturist in the Last Year: Never true  Transportation Needs: No Transportation Needs   Lack of Transportation (Medical): No   Lack of Transportation (Non-Medical): No  Physical Activity: Inactive   Days of Exercise per Week: 0 days   Minutes of Exercise per Session: 0 min  Stress: No Stress Concern Present   Feeling of Stress : Not at all  Social Connections: Socially Integrated   Frequency of Communication with Friends and Family: Twice a week   Frequency of Social Gatherings with Friends and Family: More than three times a week   Attends Religious Services: 1 to 4 times per year   Active Member of [employer] or Organizations: Yes   Attends Music therapist: More than 4 times per year   Marital Status: Married    Tobacco Counseling Counseling given: Not Answered   Clinical Intake:  Pre-visit preparation completed: Yes  Pain : No/denies pain     BMI - recorded: 29.94 Nutritional Status: BMI 25 -29 Overweight Nutritional Risks: None Diabetes: No  How often do you need to have someone help you when you read instructions, pamphlets, or other written materials from your doctor or pharmacy?: 1 - Never  Diabetic?No  Interpreter Needed?: No  Information entered by :: Caroleen Hamman LPN   Activities of Daily Living In your present state of health, do you have any difficulty performing the following activities: 04/24/2021  Hearing? N  Vision? N  Difficulty concentrating or making decisions? N  Walking or climbing stairs? N   Dressing or bathing? N  Doing errands, shopping? N  Preparing Food and eating ? N  Using the Toilet? N  In the past six months, have you accidently leaked urine? N  Do you have problems with loss of bowel control? N  Managing your Medications? N  Managing your Finances? N  Housekeeping or managing your Housekeeping? N  Some recent data might be hidden    Patient Care Team: Carollee Herter, Alferd Apa, DO as PCP - General Monna Fam, MD as Consulting Physician (Ophthalmology) Collier Salina, MD as Consulting Physician (Rheumatology)  Indicate any recent Medical Services you may have received from other than [facility] providers in the  past year (date may be approximate).     Assessment:   This is a routine wellness examination for Stephanie [name].  Hearing/Vision screen Hearing Screening - Comments:: No issues Vision Screening - Comments:: Last eye exam-1-2 years ago  Dietary issues and exercise activities discussed: Current Exercise Habits: The patient does not participate in regular exercise at present, Exercise limited by: None identified   Goals Addressed             This Visit's Progress    Patient Stated       Drink more water, Increase activity & eat healthier       Depression Screen PHQ 2/9 Scores 04/24/2021 03/29/2021 01/04/2020 05/06/2017  PHQ - 2 Score 0 0 0 1    Fall Risk Fall Risk  04/24/2021 03/29/2021 01/04/2020 03/30/2018  Falls in the past year? 0 0 0 0  Number falls in past yr: 0 0 0 -  Injury with Fall? 0 0 0 -  Follow up Falls prevention discussed Falls evaluation completed Falls evaluation completed -    FALL RISK PREVENTION PERTAINING TO THE HOME:  Any stairs in or around the home? No  Home free of loose throw rugs in walkways, pet beds, electrical cords, etc? Yes  Adequate lighting in your home to reduce risk of falls? Yes   ASSISTIVE DEVICES UTILIZED TO PREVENT FALLS:  Life alert? No  Use of a cane, walker or w/c? No  Grab bars in the bathroom?  Yes  Shower chair or bench in shower? Yes  Elevated toilet seat or a handicapped toilet? No   TIMED UP AND GO:  Was the test performed? No . Phone visit   Cognitive Function:Normal cognitive status assessed by this Nurse Health Advisor. No abnormalities found.          Immunizations Immunization History  Administered Date(s) Administered   Fluad Quad(high Dose 65+) 03/29/2021   Influenza, High Dose Seasonal PF 01/23/2018   Influenza,inj,Quad PF,6+ Mos 12/08/2012   Influenza-Unspecified 12/31/2019   PFIZER(Purple Top)SARS-COV-2 Vaccination 04/16/2019, 05/12/2019, 12/31/2019   Pneumococcal Conjugate-13 05/06/2017   Pneumococcal Polysaccharide-23 01/04/2020   Td 06/28/2004   Tdap 05/12/2012   Zoster, Live 05/12/2012    TDAP status: Up to date  Flu Vaccine status: Up to date  Pneumococcal vaccine status: Up to date  Covid-19 vaccine status: Information provided on how to obtain vaccines.   Qualifies for Shingles Vaccine? Yes   Zostavax completed Yes   Shingrix Completed?: No.    Education has been provided regarding the importance of this vaccine. Patient has been advised to call insurance company to determine out of pocket expense if they have not yet received this vaccine. Advised may also receive vaccine at local pharmacy or Health Dept. Verbalized acceptance and understanding.  Screening Tests Health Maintenance  Topic Date Due   Zoster Vaccines- Shingrix (1 of 2) Never done   COLONOSCOPY (Pts 45-49yrs Insurance coverage will need to be confirmed)  08/14/2018   COVID-19 Vaccine (4 - Booster for Pfizer series) 02/25/2020   MAMMOGRAM  04/02/2022   TETANUS/TDAP  05/13/2022   Pneumonia Vaccine 37+ Years old  Completed   INFLUENZA VACCINE  Completed   DEXA SCAN  Completed   Hepatitis C Screening  Completed   HPV VACCINES  Aged Out    Health Maintenance  Health Maintenance Due  Topic Date Due   Zoster Vaccines- Shingrix (1 of 2) Never done   COLONOSCOPY (Pts  45-53yrs Insurance coverage will need to be confirmed)  08/14/2018  COVID-19 Vaccine (4 - Booster for Pfizer series) 02/25/2020    Colorectal cancer screening: Patient states she has an upcoming appt.  Mammogram status: Completed 04/02/2021-bilateral. Repeat every year  Bone Density status: Completed 04/02/2021. Results reflect: Bone density results: OSTEOPENIA. Repeat every 2 years.  Lung Cancer Screening: (Low Dose CT Chest recommended if Age 17-80 years, 30 pack-year currently smoking OR have quit w/in 15years.) does not qualify.     Additional Screening:  Hepatitis C Screening: Completed 05/06/2017  Vision Screening: Recommended annual ophthalmology exams for early detection of glaucoma and other disorders of the eye. Is the patient up to date with their annual eye exam?  No  Who is the provider or what is the name of the office in which the patient attends annual eye exams? Dr. Herbert Deaner   Dental Screening: Recommended annual dental exams for proper oral hygiene  Community Resource Referral / Chronic Care Management: CRR required this visit?  No   CCM required this visit?  No      Plan:     I have personally reviewed and noted the following in the patients chart:   Medical and social history Use of alcohol, tobacco or illicit drugs  Current medications and supplements including opioid prescriptions.  Functional ability and status Nutritional status Physical activity Advanced directives List of other physicians Hospitalizations, surgeries, and ER visits in previous 12 months Vitals Screenings to include cognitive, depression, and falls Referrals and appointments  In addition, I have reviewed and discussed with patient certain preventive protocols, quality metrics, and best practice recommendations. A written personalized care plan for preventive services as well as general preventive health recommendations were provided to patient.   Due to this being a telephonic  visit, the after visit summary with patients personalized plan was offered to patient via mail or my-chart. Patient would like to access on my-chart.   Marta Antu, LPN   07/28/8020  Nurse Health Advisor  Nurse Notes: None

## 2021-04-24 NOTE — Patient Instructions (Signed)
°  Wrist flexion/extension  - roll medium sized ball forward and backward focusing on increased flexion.  Hold arm with wrist flexed for ~2 seconds.  Complete 2 sets of 10 on both sides.  - roll ball side to side keeping wrist in flexed position.  Complete 2 sets of 10 on both sides.  Rotate ball in fingertips (clockwise and counter-clockwise). Toss ball between hands. Toss ball in air and catch with the same hand.

## 2021-04-24 NOTE — Therapy (Signed)
[facility] [address], [place], [place], [address] Phone: [phone]   Fax:  [phone]  Occupational Therapy Treatment  Patient Details  Name: Stephanie [name] MRN: [id number] Date of Birth: 1950-04-13 Referring Provider (OT): Carollee Herter, Alferd Apa, [place]   Encounter Date: 04/24/2021   OT End of Session - 04/24/21 0802     Visit Number 3    Number of Visits 9    Date for OT Re-Evaluation 06/08/21    Authorization Type [employer]    OT Start Time 0802    OT Stop Time 0845    OT Time Calculation (min) 43 min    Activity Tolerance Patient tolerated treatment well    Behavior During Therapy [facility] for tasks assessed/performed             Past Medical History:  Diagnosis Date   Arthritis    Cataract    beginning signs of cataract   Complication of anesthesia    Per pt, hard to wake up past c-section/ 1 time.   DDD (degenerative disc disease)    in feet and hands   GERD (gastroesophageal reflux disease)    Hx of colonic polyps 08/20/2016   Hyperlipidemia    Lactose intolerance     Past Surgical History:  Procedure Laterality Date   CATARACT EXTRACTION Bilateral summer 2021   hecker   [place] W/ BILATERAL SALPINGOOPHORECTOMY      There were no vitals filed for this visit.   Subjective Assessment - 04/24/21 0806     Subjective  Pt reports continued challenges with swelling and pain in MCP in index and long finger.    Pertinent History osteoarthritis, GERD, hyperlipidemia    Currently in Pain? No/denies             [facility] with focus on pinch strength. Placed and removed large pegs into resistive peg board with focus on variety of pinches to challenge pinch strength.  Pt with no c/o pain or discomfort with this activity.  [place] with resistive clothespins (4#, 6#, and 8#) placing on horizontal surface with focus on tip to tip and lateral  pinch.  Pt completed with good tolerance and no reports of increased pain.    [place] ball rolling on table with focus on wrist flexion and supination/pronation bilaterally.  Pt reports increased strain in wrist flexion with ball, but no pain, especially when rotating side to side maintaining flexed position.  Pt completed 2 x 10 each exercise bilaterally.  Pt reports feeling a stretch in wrist, but not painful.                       OT Short Term Goals - 04/18/21 0955       OT SHORT TERM GOAL #1   Title Pt will be independent in [facility] and strengthening HEP to incresae functional grasp during self-care tasks.    Time 4    Period Weeks    Status On-going    Target Date 05/10/21      OT SHORT TERM GOAL #2   Title Pt will explore options for DME/AE to increase independence and ease pain with self-care and leisure tasks.    Time 4    Period Weeks    Status On-going               OT  Long Term Goals - 04/18/21 0955       OT LONG TERM GOAL #1   Title Pt will demonstrate increased lateral pinch and tip to tip pinch by 2# bilaterally to increase independence with managing fasteners.    Time 8    Period Weeks    Status On-going    Target Date 06/09/21      OT LONG TERM GOAL #2   Title Pt will be able to get up from stooped position with improved ease, per pt report, to allow pt to engage in gardening tasks.    Time 8    Period Weeks    Status On-going      OT LONG TERM GOAL #3   Title Pt will demonstrate increased wrist flexion by 10* bilaterally to increase independence with managing water jug in hand while pouring.    Baseline R: 35* and L: 45*    Time 8    Period Weeks    Status On-going      OT LONG TERM GOAL #4   Title Pt will demonstrate sustained grasp in B hands to maintain grasp on coffee mug without pain or droppping mug while ambulating 10' to simulate walking in home environment.    Time 8    Period Weeks    Status On-going                    Plan - 04/24/21 0803     Clinical Impression Statement Pt reports completing theraputty exercises at home per instructions with no increase in pain.  Pt tolerated resistive peg board task with no increase in pain in MCPs. Engaged in ball rolling on table top with focus on increased wrist flexion and supination/pronation with pt reporting increased stretch at wrist, tolerating motion but feeling adequate challenge.    OT Occupational Profile and History Detailed Assessment- Review of Records and additional review of physical, cognitive, psychosocial history related to current functional performance    Occupational performance deficits (Please refer to evaluation for details): ADL's;IADL's;Leisure    Body Structure / Function / Physical Skills ADL;Balance;Body mechanics;Coordination;Decreased knowledge of use of DME;Dexterity;Edema;Endurance;Flexibility;FMC;GMC;IADL;Mobility;Pain;ROM;Strength;UE functional use    Rehab Potential Good    Clinical Decision Making Limited treatment options, no task modification necessary    Comorbidities Affecting Occupational Performance: May have comorbidities impacting occupational performance    Modification or Assistance to Complete Evaluation  No modification of tasks or assist necessary to complete eval    OT Frequency 1x / week    OT Duration 8 weeks    OT Treatment/Interventions Self-care/ADL training;Biofeedback;Cryotherapy;Moist Heat;Electrical Stimulation;Ultrasound;Iontophoresis;Therapeutic exercise;Neuromuscular education;Energy conservation;DME and/or AE instruction;Functional Mobility Training;Manual Therapy;Passive range of motion;Splinting;Therapeutic activities;Patient/family education;Balance training    Plan wrist movement to hold coffee mug and pour water jug, add to theraputty HEP with thumb mobility    OT Home Exercise Plan [id number]    Consulted and Agree with Plan of Care Patient             Patient will benefit from  skilled therapeutic intervention in order to improve the following deficits and impairments:   Body Structure / Function / Physical Skills: ADL, Balance, Body mechanics, Coordination, Decreased knowledge of use of DME, Dexterity, Edema, Endurance, Flexibility, FMC, GMC, IADL, Mobility, Pain, ROM, Strength, UE functional use       Visit Diagnosis: Muscle weakness (generalized)  Other lack of coordination  Localized edema  Pain in left wrist  Pain in right wrist  Unsteadiness on feet  Problem List Patient Active Problem List   Diagnosis Date Noted   SOB (shortness of breath) 09/25/2020   Inflammatory arthritis 08/09/2020   Positive ANA (antinuclear antibody) 07/11/2020   Bilateral wrist pain 07/11/2020   Fatigue 06/02/2020   Dizziness 06/02/2020   Arthralgia 06/02/2020   Palpitations 06/02/2020   Basal cell carcinoma (BCC) of forehead 04/24/2020   Lactose intolerance    Snoring 05/16/2018   OSA (obstructive sleep apnea) 05/16/2018   Restless legs 01/26/2018   Insomnia 01/26/2018   Snores 01/26/2018   Hx of colonic polyps 08/20/2016   BACK PAIN 02/29/2008   Hyperlipidemia 04/23/2007   Osteoarthritis 04/23/2007   [place] DISEASE, LUMBAR 04/23/2007    Simonne Come, OT 04/24/2021, 10:21 AM  [facility] [address] [place], [place], [address] Phone: [phone]   Fax:  [phone]  Name: Stephanie [name] MRN: [id number] Date of Birth: April 30, 1950

## 2021-05-02 ENCOUNTER — Ambulatory Visit: Payer: PPO | Admitting: Occupational Therapy

## 2021-05-02 ENCOUNTER — Encounter: Payer: Self-pay | Admitting: Internal Medicine

## 2021-05-02 ENCOUNTER — Ambulatory Visit: Payer: PPO | Admitting: Internal Medicine

## 2021-05-02 ENCOUNTER — Other Ambulatory Visit: Payer: Self-pay

## 2021-05-02 ENCOUNTER — Encounter: Payer: Self-pay | Admitting: Occupational Therapy

## 2021-05-02 VITALS — BP 137/83 | HR 87 | Ht 63.0 in | Wt 167.0 lb

## 2021-05-02 DIAGNOSIS — M6281 Muscle weakness (generalized): Secondary | ICD-10-CM | POA: Diagnosis not present

## 2021-05-02 DIAGNOSIS — R278 Other lack of coordination: Secondary | ICD-10-CM

## 2021-05-02 DIAGNOSIS — M25532 Pain in left wrist: Secondary | ICD-10-CM

## 2021-05-02 DIAGNOSIS — M1991 Primary osteoarthritis, unspecified site: Secondary | ICD-10-CM

## 2021-05-02 DIAGNOSIS — M2550 Pain in unspecified joint: Secondary | ICD-10-CM | POA: Diagnosis not present

## 2021-05-02 DIAGNOSIS — R600 Localized edema: Secondary | ICD-10-CM

## 2021-05-02 DIAGNOSIS — M1990 Unspecified osteoarthritis, unspecified site: Secondary | ICD-10-CM | POA: Diagnosis not present

## 2021-05-02 DIAGNOSIS — M25531 Pain in right wrist: Secondary | ICD-10-CM

## 2021-05-02 MED ORDER — PREDNISONE 5 MG PO TABS: 5.0000 mg | ORAL_TABLET | Freq: Every day | ORAL | 1 refills | Status: DC | PRN

## 2021-05-02 MED ORDER — HYDROXYCHLOROQUINE SULFATE 200 MG PO TABS: ORAL_TABLET | ORAL | 1 refills | Status: DC

## 2021-05-02 NOTE — Therapy (Signed)
[facility] [address] [place], [place], [address] Phone: [phone]   Fax:  [phone]  Occupational Therapy Treatment  Patient Details  Name: Stephanie [name] MRN: [id number] Date of Birth: [birth year] Referring Provider (OT): Carollee Herter, [place], [place]   Encounter Date: 05/02/2021   OT End of Session - 05/02/21 1111     Visit Number 4    Number of Visits 9    Date for OT Re-Evaluation 06/08/21    Authorization Type [employer]    OT Start Time 1108    OT Stop Time 1148    OT Time Calculation (min) 40 min    Activity Tolerance Patient tolerated treatment well    Behavior During Therapy [facility] for tasks assessed/performed             Past Medical History:  Diagnosis Date   Arthritis    Cataract    beginning signs of cataract   Complication of anesthesia    Per pt, hard to wake up past c-section/ 1 time.   DDD (degenerative disc disease)    in feet and hands   GERD (gastroesophageal reflux disease)    Hx of colonic polyps 08/20/2016   Hyperlipidemia    Lactose intolerance     Past Surgical History:  Procedure Laterality Date   CATARACT EXTRACTION Bilateral summer 2021   hecker   [place] W/ BILATERAL SALPINGOOPHORECTOMY      There were no vitals filed for this visit.   Subjective Assessment - 05/02/21 1109     Subjective  Pt reports spreading mulch over last 2 days with minimal difficulty with use of shovel, but continued difficulty when managing coffee pot or removing items from microwave.    Pertinent History osteoarthritis, GERD, hyperlipidemia    Currently in Pain? Yes    Pain Score 5     Pain Location Hand    Pain Orientation Right;Left    Pain Descriptors / Indicators Aching;Dull    Pain Type Acute pain    Pain Onset In the past 7 days             BUE wrist flexion/extension and ulnar/radial deviation while  rolling hand on ball to facilitate increased flexion/extension and ulnar/radial deviation.    Engaged in pouring coffee from mug and picking up coffee cup.  Pt reports increased strain in thumb with radial and ulnar deviation.  Educated on modifications of holding coffee mug, pt reports using a lighter weight coffee mug with larger handle to decrease strain.  Engaged in thumb flexion, thumb opposition to pinky finger, thumb radial adduction, wrist radial deviation with resistance and thumb abduction with resistance.  Pt reports mild strain with movements, but able to tolerate 1 set of 10 each.  Min cues for technique with resistance, while educating in modifications for tasks if resistance too challenging/painful. Provided printed HEP.  Exercises Seated Composite Thumb Flexion AROM - 1 x daily - 5 x weekly - 1 sets - 10 reps - 15 hold Thumb Opposition - 1 x daily - 5 x weekly - 1 sets - 10 reps - 10 hold Thumb Radial Adduction with Thumb Flexion AROM on Table - 1 x daily - 5 x weekly - 1 sets - 10 reps - 10 hold Seated Wrist Radial Deviation with Anchored Resistance - 1 x daily - 5 x weekly - 1 sets -  10 reps Resisted Finger Extension and Thumb Abduction - 1 x daily - 5 x weekly - 1 sets - 10 reps                      OT Short Term Goals - 04/18/21 0955       OT SHORT TERM GOAL #1   Title Pt will be independent in [facility] and strengthening HEP to incresae functional grasp during self-care tasks.    Time 4    Period Weeks    Status On-going    Target Date 05/10/21      OT SHORT TERM GOAL #2   Title Pt will explore options for DME/AE to increase independence and ease pain with self-care and leisure tasks.    Time 4    Period Weeks    Status On-going               OT Long Term Goals - 04/18/21 0955       OT LONG TERM GOAL #1   Title Pt will demonstrate increased lateral pinch and tip to tip pinch by 2# bilaterally to increase independence with managing fasteners.     Time 8    Period Weeks    Status On-going    Target Date 06/09/21      OT LONG TERM GOAL #2   Title Pt will be able to get up from stooped position with improved ease, per pt report, to allow pt to engage in gardening tasks.    Time 8    Period Weeks    Status On-going      OT LONG TERM GOAL #3   Title Pt will demonstrate increased wrist flexion by 10* bilaterally to increase independence with managing water jug in hand while pouring.    Baseline R: 35* and L: 45*    Time 8    Period Weeks    Status On-going      OT LONG TERM GOAL #4   Title Pt will demonstrate sustained grasp in B hands to maintain grasp on coffee mug without pain or droppping mug while ambulating 10' to simulate walking in home environment.    Time 8    Period Weeks    Status On-going                   Plan - 05/02/21 1111     Clinical Impression Statement Pt continues to report pain in B hands, especially with radial and ulnar deviation impacting ability to pour coffee, hold coffee mug, and remove items from microwave or transport pot from sink <> stove.  Pt tolerated exercises with focus on radial and ulnar deviation, reporting increased strain and discomfort with radial deviation.  Pt receptive to recommendations to modify hand hold on coffee cup and possible need to change out pots to allow for bilateral hand hold to reduce strain on hands.    OT Occupational Profile and History Detailed Assessment- Review of Records and additional review of physical, cognitive, psychosocial history related to current functional performance    Occupational performance deficits (Please refer to evaluation for details): ADL's;IADL's;Leisure    Body Structure / Function / Physical Skills ADL;Balance;Body mechanics;Coordination;Decreased knowledge of use of DME;Dexterity;Edema;Endurance;Flexibility;FMC;GMC;IADL;Mobility;Pain;ROM;Strength;UE functional use    Rehab Potential Good    Clinical Decision Making Limited  treatment options, no task modification necessary    Comorbidities Affecting Occupational Performance: May have comorbidities impacting occupational performance    Modification or Assistance to Complete Evaluation  No modification of tasks or assist necessary to complete eval    OT Frequency 1x / week    OT Duration 8 weeks    OT Treatment/Interventions Self-care/ADL training;Biofeedback;Cryotherapy;Moist Heat;Electrical Stimulation;Ultrasound;Iontophoresis;Therapeutic exercise;Neuromuscular education;Energy conservation;DME and/or AE instruction;Functional Mobility Training;Manual Therapy;Passive range of motion;Splinting;Therapeutic activities;Patient/family education;Balance training    Plan wrist movement to hold coffee mug and pour water jug, add to theraputty HEP with thumb mobility. Reiterate radial/ulnar deviation exercises    OT Home Exercise Plan [id number]    Consulted and Agree with Plan of Care Patient             Patient will benefit from skilled therapeutic intervention in order to improve the following deficits and impairments:   Body Structure / Function / Physical Skills: ADL, Balance, Body mechanics, Coordination, Decreased knowledge of use of DME, Dexterity, Edema, Endurance, Flexibility, FMC, GMC, IADL, Mobility, Pain, ROM, Strength, UE functional use       Visit Diagnosis: Muscle weakness (generalized)  Other lack of coordination  Localized edema  Pain in left wrist  Pain in right wrist    Problem List Patient Active Problem List   Diagnosis Date Noted   SOB (shortness of breath) 09/25/2020   Inflammatory arthritis 08/09/2020   Positive ANA (antinuclear antibody) 07/11/2020   Bilateral wrist pain 07/11/2020   Fatigue 06/02/2020   Dizziness 06/02/2020   Arthralgia 06/02/2020   Palpitations 06/02/2020   Basal cell carcinoma (BCC) of forehead 04/24/2020   Lactose intolerance    Snoring 05/16/2018   OSA (obstructive sleep apnea) 05/16/2018   Restless  legs 01/26/2018   Insomnia 01/26/2018   Snores 01/26/2018   Hx of colonic polyps 08/20/2016   BACK PAIN 02/29/2008   Hyperlipidemia 04/23/2007   Osteoarthritis 04/23/2007   [place] DISEASE, LUMBAR 04/23/2007    Simonne Come, OT 05/02/2021, 12:50 PM  [facility] [address], [place], [place], [address] Phone: [phone]   Fax:  [phone]  Name: SHAQUETA CASADY MRN: [id number] Date of Birth: [birth year]

## 2021-05-02 NOTE — Progress Notes (Signed)
Office Visit Note  Patient: Stephanie [name]             Date of Birth: 03/28/1950           MRN: [id number]             PCP: [name], DO Referring: [name], * Visit Date: 05/02/2021   Subjective:   History of Present Illness: Stephanie [name] is a 71 y.o. female here for follow up for seronegative inflammatory arthritis on HCQ 300 mg daily and prednisone 5 mg daily. She has felt some gradual recovery of her shortness of breath over time. Pulmonology clinic testing all looked fine. She is taking the prednisone most of the time, sometimes skipping doses and tends to notice increased pain starting with her ankles or plantar fasciitis after 2 or 3 days. She has been working with OT for about a month, so far no huge improvements but is slightly increasing wrist ROM.  Previous HPI 01/03/21 Stephanie [name] is a 71 y.o. female here for follow up with seronegative inflammatory arthritis on HCQ 300 mg PO daily and tapering down prednisone from 10 mg daily to 5 mg daily. Since our visit a series of studies starting from cardiac coronary CT scan showing liver lesions and abdominal CT showing multiple liver nodules possibly hemangiomata but is scheduled for MRI 10/29. She has experienced some abdominal and central facial swelling with the ongoing prednisone use. She continues to have shortness of breath without cause seen on chest imaging or heart imaging so far.    Previous HPI 07/11/20 Stephanie [name] is a 71 y.o. female here for evaluation of positive ANA with fatigue and joint pain symptoms. She has increased pain in her bilateral hands and wrists for about 6 months with no known preceding health events or changes. She hs a prior history of osteoarthritis chronically affecting her fingers especially base of the thumb but not this current problem. She has previously experienced carpal tunnel syndrome when working with sewing but does not think this is similar. She notices improvement  with NSAIDs but she cannot tolerate taking high doses of these or multiple times per day due to abdominal and epigastric pain and diarrhea. She does not notice much swelling, warmth or discoloration of the affected areas. Workup in primary care office demonstrated a positive ANA and negative RF test. She denies noticing other new health problems such as hair loss, oral ulcers, lymphadenopathy, raynaud's phenomenon, rashes, or fevers.    Review of Systems  Constitutional:  Negative for fatigue.  HENT:  Negative for mouth sores, mouth dryness and nose dryness.   Eyes:  Negative for pain, itching and dryness.  Respiratory:  Negative for shortness of breath and difficulty breathing.   Cardiovascular:  Negative for chest pain and palpitations.  Gastrointestinal:  Negative for blood in stool, constipation and diarrhea.  Endocrine: Negative for increased urination.  Genitourinary:  Negative for difficulty urinating.  Musculoskeletal:  Positive for joint pain, joint pain and joint swelling. Negative for myalgias, morning stiffness, muscle tenderness and myalgias.  Skin:  Negative for color change, rash and redness.  Allergic/Immunologic: Negative for susceptible to infections.  Neurological:  Negative for dizziness, numbness, headaches, memory loss and weakness.  Hematological:  Negative for bruising/bleeding tendency.  Psychiatric/Behavioral:  Negative for confusion.    PMFS History:  Patient Active Problem List   Diagnosis Date Noted   SOB (shortness of breath) 09/25/2020   Inflammatory arthritis 08/09/2020  Bilateral wrist pain 07/11/2020   Fatigue 06/02/2020   Dizziness 06/02/2020   Arthralgia 06/02/2020   Palpitations 06/02/2020   Basal cell carcinoma (BCC) of forehead 04/24/2020   Lactose intolerance    Snoring 05/16/2018   OSA (obstructive sleep apnea) 05/16/2018   Restless legs 01/26/2018   Insomnia 01/26/2018   Snores 01/26/2018   Hx of colonic polyps 08/20/2016   BACK PAIN  02/29/2008   Hyperlipidemia 04/23/2007   Osteoarthritis 04/23/2007   [place] DISEASE, LUMBAR 04/23/2007    Past Medical History:  Diagnosis Date   Arthritis    Cataract    bilateral sx   Complication of anesthesia    Per pt, hard to wake up past c-section/ 1 time.   DDD (degenerative disc disease)    in feet and hands   GERD (gastroesophageal reflux disease)    OTC PRN meds   Hx of colonic polyps 08/20/2016   Hyperlipidemia    on meds   Lactose intolerance    Osteopenia    Rheumatoid arthritis (HCC)    Seasonal allergies    Sleep apnea    does not use CPAP    Family History  Problem Relation Age of Onset   Hypertension Mother    Dementia Mother    Arthritis Mother    Alzheimer's disease Mother    Arthritis Father    Liver disease Father        NASH   Hypertension Father    Arthritis Sister    Liver disease Sister    Arthritis Brother    Liver disease Paternal Aunt    Liver disease Paternal Aunt    Liver disease Paternal 24    Healthy Daughter    Healthy Son    Cystic fibrosis Other        niece   Colon cancer Neg Hx    Colon polyps Neg Hx    Esophageal cancer Neg Hx    Rectal cancer Neg Hx    Stomach cancer Neg Hx    Past Surgical History:  Procedure Laterality Date   CATARACT EXTRACTION Bilateral 2021   Dr. Herbert Deaner   CESAREAN SECTION     1980, 1982   COLONOSCOPY  2018   CG-MAC-Miralax (exc)-polyps   POLYPECTOMY  2018   polyps   TOTAL ABDOMINAL HYSTERECTOMY W/ BILATERAL SALPINGOOPHORECTOMY     Social History   Social History Narrative   Married, 1 son and 1 daughter   Self-employed   2 caffienated beverages/day   Immunization History  Administered Date(s) Administered   Fluad Quad(high Dose 65+) 03/29/2021   Influenza, High Dose Seasonal PF 01/23/2018   Influenza,inj,Quad PF,6+ Mos 12/08/2012   Influenza-Unspecified 12/31/2019   PFIZER(Purple Top)SARS-COV-2 Vaccination 04/16/2019, 05/12/2019, 12/31/2019   Pneumococcal Conjugate-13 05/06/2017    Pneumococcal Polysaccharide-23 01/04/2020   Td 06/28/2004   Tdap 05/12/2012   Zoster, Live 05/12/2012     Objective: Vital Signs: BP 137/83 (BP Location: Left Arm, Patient Position: Sitting, Cuff Size: Normal)    Pulse 87    Ht _0  (1.6 m)    Wt 167 lb (75.8 kg)    BMI 29.58 kg/m    Physical Exam Cardiovascular:     Rate and Rhythm: Normal rate and regular rhythm.  Pulmonary:     Effort: Pulmonary effort is normal.     Breath sounds: Normal breath sounds.  Musculoskeletal:     Right lower leg: No edema.     Left lower leg: No edema.  Skin:  General: Skin is warm and dry.     Findings: No rash.  Neurological:     Mental Status: She is alert.  Psychiatric:        Mood and Affect: Mood normal.     Musculoskeletal Exam:  Shoulders full ROM no tenderness or swelling Elbows full ROM no tenderness or swelling Wrists decreased dorsiflexion ROM, right wrist swelling left wrist swelling and mild tenderness Fingers mild enlargement of 2nd-3rd MCPs ROM is intact Knees full ROM no tenderness or swelling Ankles full ROM no tenderness or swelling   CDAI Exam: CDAI Score: 6  Patient Global: 10 mm; Provider Global: 20 mm Swollen: 2 ; Tender: 1  Joint Exam 05/02/2021      Right  Left  Wrist  Swollen   Swollen Tender     Investigation: No additional findings.  Imaging: No results found.  Recent Labs: Lab Results  Component Value Date   WBC 8.2 03/29/2021   HGB 12.8 03/29/2021   PLT 215.0 03/29/2021   NA 142 03/29/2021   K 4.7 03/29/2021   CL 106 03/29/2021   CO2 27 03/29/2021   GLUCOSE 95 03/29/2021   BUN 15 03/29/2021   CREATININE 0.84 03/29/2021   BILITOT 0.7 03/29/2021   ALKPHOS 56 03/29/2021   AST 15 03/29/2021   ALT 14 03/29/2021   PROT 6.7 03/29/2021   ALBUMIN 4.2 03/29/2021   CALCIUM 9.2 03/29/2021    Speciality Comments: No specialty comments available.  Procedures:  No procedures performed Allergies: Penicillins   Assessment / Plan:      Visit Diagnoses: Inflammatory arthritis - Plan: Anti-DNA antibody, double-stranded, Sedimentation rate, C-reactive protein, hydroxychloroquine (PLAQUENIL) 200 MG tablet, predniSONE (DELTASONE) 5 MG tablet  Symptoms looking most consistent as a seronegative RA so far based on distribution, steroid and plaquenil responsiveness. Wrists with some persistent swelling. Rechecking sed rate and CRP today for disease activity monitoring. Repeating dsDNA which was borderline last year I think lupus is unlikely. Plan to continue HCQ 300 mg daily. Can continue prednisone 5 mg daily if she does feel very well can try reducing these to half dose as tolerable but seems to be tolerating this pretty well.  Primary osteoarthritis, unspecified site  Existing OA from finger changes, pain likely also being treated with the prednisone along with inflammation. No severe loss of ROM or grip strength.  Arthralgia, unspecified joint   Orders: Orders Placed This Encounter  Procedures   Anti-DNA antibody, double-stranded   Sedimentation rate   C-reactive protein   Meds ordered this encounter  Medications   hydroxychloroquine (PLAQUENIL) 200 MG tablet    Sig: TAKE 1 & 1/2 (ONE & ONE-HALF) TABLETS BY MOUTH ONCE DAILY    Dispense:  135 tablet    Refill:  1   predniSONE (DELTASONE) 5 MG tablet    Sig: Take 1 tablet (5 mg total) by mouth daily as needed.    Dispense:  90 tablet    Refill:  1     Follow-Up Instructions: Return in about 6 months (around 10/30/2021) for Seronegative RA on HCQ/GC f/u 61mo.   CCollier Salina MD  Note - This record has been created using [employer]  Chart creation errors have been sought, but may not always  have been located. Such creation errors do not reflect on  the standard of medical care.

## 2021-05-03 LAB — SEDIMENTATION RATE: Sed Rate: 2 mm/h (ref 0–30)

## 2021-05-03 LAB — C-REACTIVE PROTEIN: CRP: 4.3 mg/L (ref <8.0)

## 2021-05-03 LAB — ANTI-DNA ANTIBODY, DOUBLE-STRANDED: ds DNA Ab: 1 IU/mL

## 2021-05-04 ENCOUNTER — Encounter: Payer: Self-pay | Admitting: Internal Medicine

## 2021-05-04 ENCOUNTER — Other Ambulatory Visit: Payer: Self-pay

## 2021-05-04 ENCOUNTER — Ambulatory Visit (AMBULATORY_SURGERY_CENTER): Payer: PPO

## 2021-05-04 VITALS — Ht 63.0 in | Wt 165.0 lb

## 2021-05-04 DIAGNOSIS — Z86010 Personal history of colonic polyps: Secondary | ICD-10-CM

## 2021-05-04 NOTE — Progress Notes (Signed)
No egg or soy allergy known to patient  No issues known to pt with past sedation with any surgeries or procedures Patient denies ever being told they had issues or difficulty with intubation  No FH of Malignant Hyperthermia Pt is not on diet pills Pt is not on home 02  Pt is not on blood thinners  Pt denies issues with constipation;  No A fib or A flutter Pt is fully vaccinated for Covid;  NO PA's for preps discussed with pt in PV today  Discussed with pt there will be an out-of-pocket cost for prep and that varies from $0 to 70 + dollars - pt verbalized understanding  Due to the COVID-19 pandemic we are asking patients to follow certain guidelines in PV and the [place]   Pt aware of COVID protocols and [facility] guidelines  PV completed over the phone. Pt verified name, DOB, address and insurance during PV today.  Pt mailed instruction packet with copy of consent form to read and not return, and instructions.  Pt encouraged to call with questions or issues.  If pt has My chart, procedure instructions sent via My Chart

## 2021-05-04 NOTE — Progress Notes (Signed)
Lab results all look fine. Inflammatory markers are normal on the current medicines. The dsDNA test is now negative so definitely does not seem to be lupus. I would continue to label this as seronegative rheumatoid arthritis.

## 2021-05-08 ENCOUNTER — Encounter: Payer: Self-pay | Admitting: Occupational Therapy

## 2021-05-08 NOTE — Therapy (Unsigned)
[facility] [address] [place], [place], [address] Phone: [phone]   Fax:  [phone]  Patient Details  Name: Stephanie [name] MRN: [id number] Date of Birth: 22-Jul-1950 Referring Provider:  No ref. provider found  Encounter Date: 05/08/2021 OCCUPATIONAL THERAPY DISCHARGE SUMMARY  Visits from Start of Care: 4  Current functional level related to goals / functional outcomes: Pt continues to demonstrate and report pain and decreased strength with functional tasks such as holding coffee mug and pouring from coffee pot or water jug.  Pt has been educated on both adaptive strategies as well as strengthening exercises to increase independence and decrease pain with self-care and leisure tasks.  Pt has requested to discontinue treatment.  Remaining deficits: Inflammatory arthritis impacting wrist flexion and extension, most noted with pouring and carrying heavier dishes and objects.   Education / Equipment: Pt has been educated on both adaptive strategies as well as strengthening exercises to increase independence and decrease pain with self-care and leisure tasks.   Patient agrees to discharge. Patient goals were not met. Patient is being discharged due to the patient's request..     Simonne Come, [place] 05/08/2021, 2:32 PM  [facility] [address] [place], [place], [address] Phone: [phone]   Fax:  [phone]

## 2021-05-09 ENCOUNTER — Encounter: Payer: PPO | Admitting: Occupational Therapy

## 2021-05-14 DIAGNOSIS — E663 Overweight: Secondary | ICD-10-CM | POA: Diagnosis not present

## 2021-05-14 DIAGNOSIS — G2581 Restless legs syndrome: Secondary | ICD-10-CM | POA: Diagnosis not present

## 2021-05-14 DIAGNOSIS — G4733 Obstructive sleep apnea (adult) (pediatric): Secondary | ICD-10-CM | POA: Diagnosis not present

## 2021-05-14 DIAGNOSIS — G4700 Insomnia, unspecified: Secondary | ICD-10-CM | POA: Diagnosis not present

## 2021-05-14 DIAGNOSIS — M069 Rheumatoid arthritis, unspecified: Secondary | ICD-10-CM | POA: Diagnosis not present

## 2021-05-14 DIAGNOSIS — E785 Hyperlipidemia, unspecified: Secondary | ICD-10-CM | POA: Diagnosis not present

## 2021-05-14 DIAGNOSIS — D84821 Immunodeficiency due to drugs: Secondary | ICD-10-CM | POA: Diagnosis not present

## 2021-05-14 DIAGNOSIS — Z9849 Cataract extraction status, unspecified eye: Secondary | ICD-10-CM | POA: Diagnosis not present

## 2021-05-16 ENCOUNTER — Encounter: Payer: PPO | Admitting: Occupational Therapy

## 2021-05-18 ENCOUNTER — Encounter: Payer: Self-pay | Admitting: Internal Medicine

## 2021-05-18 ENCOUNTER — Other Ambulatory Visit: Payer: Self-pay

## 2021-05-18 ENCOUNTER — Ambulatory Visit (AMBULATORY_SURGERY_CENTER): Payer: PPO | Admitting: Internal Medicine

## 2021-05-18 VITALS — BP 130/82 | HR 72 | Temp 96.6°F | Resp 14 | Ht 63.0 in | Wt 165.0 lb

## 2021-05-18 DIAGNOSIS — Z86010 Personal history of colonic polyps: Secondary | ICD-10-CM

## 2021-05-18 DIAGNOSIS — D120 Benign neoplasm of cecum: Secondary | ICD-10-CM | POA: Diagnosis not present

## 2021-05-18 DIAGNOSIS — D128 Benign neoplasm of rectum: Secondary | ICD-10-CM | POA: Diagnosis not present

## 2021-05-18 DIAGNOSIS — D122 Benign neoplasm of ascending colon: Secondary | ICD-10-CM

## 2021-05-18 DIAGNOSIS — K635 Polyp of colon: Secondary | ICD-10-CM

## 2021-05-18 MED ORDER — SODIUM CHLORIDE 0.9 % IV SOLN: 500.0000 mL | INTRAVENOUS | Status: DC

## 2021-05-18 NOTE — Patient Instructions (Addendum)
Four (4) polyps removed today) Handout provided  ? ?I will let you know pathology results and when to have another routine colonoscopy by mail and/or My Chart. ? ?I appreciate the opportunity to care for you. ?Gatha Mayer, MD, [facility] ? ?No aspirin, ibuprofen, naproxen, or other non-steroidal anti-inflammatory drugs for 2 weeks after polyp removal. ? ? ?YOU HAD AN ENDOSCOPIC PROCEDURE TODAY AT [facility]:   Refer to the procedure report that was given to you for any specific questions about what was found during the examination.  If the procedure report does not answer your questions, please call your gastroenterologist to clarify.  If you requested that your care partner not be given the details of your procedure findings, then the procedure report has been included in a sealed envelope for you to review at your convenience later. ? ?YOU SHOULD EXPECT: Some feelings of bloating in the abdomen. Passage of more gas than usual.  Walking can help get rid of the air that was put into your GI tract during the procedure and reduce the bloating. If you had a lower endoscopy (such as a colonoscopy or flexible sigmoidoscopy) you may notice spotting of blood in your stool or on the toilet paper. If you underwent a bowel prep for your procedure, you may not have a normal bowel movement for a few days. ? ?Please Note:  You might notice some irritation and congestion in your nose or some drainage.  This is from the oxygen used during your procedure.  There is no need for concern and it should clear up in a day or so. ? ?SYMPTOMS TO REPORT IMMEDIATELY: ? ?Following lower endoscopy (colonoscopy or flexible sigmoidoscopy): ? Excessive amounts of blood in the stool ? Significant tenderness or worsening of abdominal pains ? Swelling of the abdomen that is new, acute ? Fever of 100?F or higher ? ?For urgent or emergent issues, a gastroenterologist can be reached at any hour by calling [phone]. ?Do not use  MyChart messaging for urgent concerns.  ? ? ?DIET:  We do recommend a small meal at first, but then you may proceed to your regular diet.  Drink plenty of fluids but you should avoid alcoholic beverages for 24 hours. ? ?ACTIVITY:  You should plan to take it easy for the rest of today and you should NOT DRIVE or use heavy machinery until tomorrow (because of the sedation medicines used during the test).   ? ?FOLLOW UP: ?Our staff will call the number listed on your records 48-72 hours following your procedure to check on you and address any questions or concerns that you may have regarding the information given to you following your procedure. If we do not reach you, we will leave a message.  We will attempt to reach you two times.  During this call, we will ask if you have developed any symptoms of COVID 19. If you develop any symptoms (ie: fever, flu-like symptoms, shortness of breath, cough etc.) before then, please call [phone].  If you test positive for Covid 19 in the 2 weeks post procedure, please call and report this information to [place].   ? ?If any biopsies were taken you will be contacted by phone or by letter within the next 1-3 weeks.  Please call us at [phone] if you have not heard about the biopsies in 3 weeks.  ? ? ?SIGNATURES/CONFIDENTIALITY: ?You and/or your care partner have signed paperwork which will be entered into your electronic medical  record.  These signatures attest to the fact that that the information above on your After Visit Summary has been reviewed and is understood.  Full responsibility of the confidentiality of this discharge information lies with you and/or your care-partner. ? ?

## 2021-05-18 NOTE — Progress Notes (Signed)
Called to room to assist during endoscopic procedure.  Patient ID and intended procedure confirmed with present staff. Received instructions for my participation in the procedure from the performing physician.  

## 2021-05-18 NOTE — Progress Notes (Signed)
VSS, transported to PACU °

## 2021-05-18 NOTE — Op Note (Signed)
[place] ?Patient Name: Stephanie [name] ?Procedure Date: 05/18/2021 9:28 AM ?MRN: [id number] ?Endoscopist: Gatha Mayer , MD ?Age: 71 ?Referring MD:  ?Date of Birth: [birth year] ?Gender: Female ?Account #: [id number] ?Procedure:                Colonoscopy ?Indications:              Surveillance: Personal history of adenomatous  ?                          polyps on last colonoscopy > 3 years ago, Last  ?                          colonoscopy: 2018 ?Medicines:                Propofol per Anesthesia, Monitored Anesthesia Care ?Procedure:                Pre-Anesthesia Assessment: ?                          - Prior to the procedure, a History and Physical  ?                          was performed, and patient medications and  ?                          allergies were reviewed. The patient's tolerance of  ?                          previous anesthesia was also reviewed. The risks  ?                          and benefits of the procedure and the sedation  ?                          options and risks were discussed with the patient.  ?                          All questions were answered, and informed consent  ?                          was obtained. Prior Anticoagulants: The patient has  ?                          taken no previous anticoagulant or antiplatelet  ?                          agents. ASA Grade Assessment: II - A patient with  ?                          mild systemic disease. After reviewing the risks  ?                          and benefits, the patient was deemed in  ?  satisfactory condition to undergo the procedure. ?                          After obtaining informed consent, the colonoscope  ?                          was passed under direct vision. Throughout the  ?                          procedure, the patient's blood pressure, pulse, and  ?                          oxygen saturations were monitored continuously. The  ?                          Colonoscope was introduced  through the anus and  ?                          advanced to the the cecum, identified by  ?                          appendiceal orifice and ileocecal valve. The  ?                          colonoscopy was somewhat difficult due to  ?                          significant looping. Successful completion of the  ?                          procedure was aided by applying abdominal pressure.  ?                          The patient tolerated the procedure well. The  ?                          quality of the bowel preparation was good. The  ?                          ileocecal valve, appendiceal orifice, and rectum  ?                          were photographed. The bowel preparation used was  ?                          Miralax via split dose instruction. ?Scope In: 9:37:34 AM ?Scope Out: 9:56:36 AM ?Scope Withdrawal Time: 0 hours 14 minutes 57 seconds  ?Total Procedure Duration: 0 hours 19 minutes 2 seconds  ?Findings:                 The perianal and digital rectal examinations were  ?                          normal. ?  A 7 mm polyp was found in the rectum. The polyp was  ?                          sessile. The polyp was removed with a hot snare.  ?                          (initially cold but tip cautery applied afterward)  ?                          Resection and retrieval were complete. Verification  ?                          of patient identification for the specimen was  ?                          done. Estimated blood loss was minimal. ?                          Three flat and sessile polyps were found in the  ?                          ascending colon and cecum. The polyps were 3 to 10  ?                          mm in size. These polyps were removed with a cold  ?                          snare. Resection and retrieval were complete.  ?                          Verification of patient identification for the  ?                          specimen was done. Estimated blood loss was  minimal. ?                          Multiple diverticula were found in the sigmoid  ?                          colon. ?                          The exam was otherwise without abnormality on  ?                          direct and retroflexion views. ?Complications:            No immediate complications. ?Estimated Blood Loss:     Estimated blood loss was minimal. ?Impression:               - One 7 mm polyp in the rectum, removed with a hot  ?                          snare.  Resected and retrieved. ?                          - Three 3 to 10 mm polyps in the ascending colon  ?                          and in the cecum, removed with a cold snare.  ?                          Resected and retrieved. ?                          - Diverticulosis in the sigmoid colon. ?                          - The examination was otherwise normal on direct  ?                          and retroflexion views. ?                          - Personal history of colonic polyps. 2018 2  ?                          adenoms and 4 ssp ?Recommendation:           - Patient has a contact number available for  ?                          emergencies. The signs and symptoms of potential  ?                          delayed complications were discussed with the  ?                          patient. Return to normal activities tomorrow.  ?                          Written discharge instructions were provided to the  ?                          patient. ?                          - Resume previous diet. ?                          - Continue present medications. ?                          - Await pathology results. ?                          - Repeat colonoscopy is recommended for  ?                          surveillance. The colonoscopy  date will be  ?                          determined after pathology results from today's  ?                          exam become available for review. ?                          - No aspirin, ibuprofen, naproxen, or other  ?                           non-steroidal anti-inflammatory drugs for 2 weeks  ?                          after polyp removal. ?Gatha Mayer, MD ?05/18/2021 10:06:46 AM ?This report has been signed electronically. ?

## 2021-05-18 NOTE — Progress Notes (Signed)
Pt's states no medical or surgical changes since previsit or office visit. 

## 2021-05-18 NOTE — Progress Notes (Signed)
[facility] History and Physical ? ? ?Primary Care Physician:  [name], DO ? ? ?Reason for Procedure:   Hx colon polyps ? ?Plan:    colonoscopy ? ? ? ? ?HPI: Stephanie [name] is a 71 y.o. female w/ hx colon polyps ? ?2018 ? ?2 adenomas and 4 sessile serrated polyps ? ? ?Past Medical History:  ?Diagnosis Date  ? Arthritis   ? Cataract   ? bilateral sx  ? Complication of anesthesia   ? Per pt, hard to wake up past c-section/ 1 time.  ? DDD (degenerative disc disease)   ? in feet and hands  ? GERD (gastroesophageal reflux disease)   ? OTC PRN meds  ? Hx of colonic polyps 08/20/2016  ? Hyperlipidemia   ? on meds  ? Lactose intolerance   ? Osteopenia   ? Rheumatoid arthritis ([place])   ? Seasonal allergies   ? Sleep apnea   ? does not use CPAP  ? ? ?Past Surgical History:  ?Procedure Laterality Date  ? CATARACT EXTRACTION Bilateral 2021  ? Dr. Herbert Deaner  ? [place]  ? COLONOSCOPY  2018  ? CG-MAC-Miralax (exc)-polyps  ? POLYPECTOMY  2018  ? polyps  ? TOTAL ABDOMINAL HYSTERECTOMY W/ BILATERAL SALPINGOOPHORECTOMY    ? ? ?Prior to Admission medications   ?Medication Sig Start Date End Date Taking? Authorizing Provider  ?atorvastatin (LIPITOR) 10 MG tablet Take 1 tablet (10 mg total) by mouth daily. 03/29/21  Yes [name], DO  ?CALCIUM PO Take 1 tablet by mouth daily as needed.   Yes [provider]  ?doxylamine, Sleep, (UNISOM) 25 MG tablet Take 25 mg by mouth at bedtime.   Yes [provider]  ?hydroxychloroquine (PLAQUENIL) 200 MG tablet TAKE 1 & 1/2 (ONE & ONE-HALF) TABLETS BY MOUTH ONCE DAILY 05/02/21  Yes Rice, Resa Miner, MD  ?predniSONE (DELTASONE) 5 MG tablet Take 1 tablet (5 mg total) by mouth daily as needed. ?Patient taking differently: Take 5 mg by mouth daily. 05/02/21  Yes Rice, Resa Miner, MD  ?rOPINIRole (REQUIP) 0.5 MG tablet 3 po qd ?Patient taking differently: Take 0.5 mg by mouth 3 (three) times daily. 2 tabs _0 :30 pm and 1 tab @  10:00 pm 03/29/21  Yes [name], DO  ? ? ?Current Outpatient Medications  ?Medication Sig Dispense Refill  ? atorvastatin (LIPITOR) 10 MG tablet Take 1 tablet (10 mg total) by mouth daily. 90 tablet 3  ? CALCIUM PO Take 1 tablet by mouth daily as needed.    ? doxylamine, Sleep, (UNISOM) 25 MG tablet Take 25 mg by mouth at bedtime.    ? hydroxychloroquine (PLAQUENIL) 200 MG tablet TAKE 1 & 1/2 (ONE & ONE-HALF) TABLETS BY MOUTH ONCE DAILY 135 tablet 1  ? predniSONE (DELTASONE) 5 MG tablet Take 1 tablet (5 mg total) by mouth daily as needed. (Patient taking differently: Take 5 mg by mouth daily.) 90 tablet 1  ? rOPINIRole (REQUIP) 0.5 MG tablet 3 po qd (Patient taking differently: Take 0.5 mg by mouth 3 (three) times daily. 2 tabs _1 :30 pm and 1 tab @ 10:00 pm) 270 tablet 3  ? ?Current Facility-Administered Medications  ?Medication Dose Route Frequency Provider Last Rate Last Admin  ? 0.9 %  sodium chloride infusion  500 mL Intravenous Continuous Gatha Mayer, MD      ? ? ?Allergies as of 05/18/2021 - Review Complete 05/18/2021  ?Allergen Reaction Noted  ?  Penicillins Anaphylaxis 04/23/2007  ? ? ?Family History  ?Problem Relation Age of Onset  ? Hypertension Mother   ? Dementia Mother   ? Arthritis Mother   ? Alzheimer's disease Mother   ? Arthritis Father   ? Liver disease Father   ?     NASH  ? Hypertension Father   ? Arthritis Sister   ? Liver disease Sister   ? Arthritis Brother   ? Liver disease Paternal Aunt   ? Liver disease Paternal Aunt   ? Liver disease Paternal Aunt   ? Healthy Daughter   ? Healthy Son   ? Cystic fibrosis Other   ?     niece  ? Colon cancer Neg Hx   ? Colon polyps Neg Hx   ? Esophageal cancer Neg Hx   ? Rectal cancer Neg Hx   ? Stomach cancer Neg Hx   ? ? ?Social History  ? ?Socioeconomic History  ? Marital status: Married  ?  Spouse name: Not on file  ? Number of children: 2  ? Years of education: Not on file  ? Highest education level: Not on file  ?Occupational History  ?  Occupation: self  ?Tobacco Use  ? Smoking status: Never  ?  Passive exposure: Never  ? Smokeless tobacco: Never  ?Vaping Use  ? Vaping Use: Never used  ?Substance and Sexual Activity  ? Alcohol use: Yes  ?  Alcohol/week: 7.0 standard drinks  ?  Types: 7 Standard drinks or equivalent per week  ? Drug use: No  ? Sexual activity: Yes  ?  Partners: Male  ?Other Topics Concern  ? Not on file  ?Social History Narrative  ? Married, 1 son and 1 daughter  ? Self-employed  ? 2 caffienated beverages/day  ? ?Social Determinants of Health  ? ?Financial Resource Strain: Low Risk   ? Difficulty of Paying Living Expenses: Not hard at all  ?Food Insecurity: No Food Insecurity  ? Worried About Charity fundraiser in the Last Year: Never true  ? Ran Out of Food in the Last Year: Never true  ?Transportation Needs: No Transportation Needs  ? Lack of Transportation (Medical): No  ? Lack of Transportation (Non-Medical): No  ?Physical Activity: Inactive  ? Days of Exercise per Week: 0 days  ? Minutes of Exercise per Session: 0 min  ?Stress: No Stress Concern Present  ? Feeling of Stress : Not at all  ?Social Connections: Socially Integrated  ? Frequency of Communication with Friends and Family: Twice a week  ? Frequency of Social Gatherings with Friends and Family: More than three times a week  ? Attends Religious Services: 1 to 4 times per year  ? Active Member of Clubs or Organizations: Yes  ? Attends Archivist Meetings: More than 4 times per year  ? Marital Status: Married  ?Intimate Partner Violence: Not At Risk  ? Fear of Current or Ex-Partner: No  ? Emotionally Abused: No  ? Physically Abused: No  ? Sexually Abused: No  ? ? ?Review of Systems: ? ?All other review of systems negative except as mentioned in the HPI. ? ?Physical Exam: ?Vital signs ?BP 118/66   Pulse 75   Temp (!) 96.6 ?F (35.9 ?C) (Temporal)   Ht _0  (1.6 m)   Wt 165 lb (74.8 kg)   SpO2 98%   BMI 29.23 kg/m?  ? ?General:   Alert,  Well-developed,  well-nourished, pleasant and cooperative in NAD ?Lungs:  Clear  throughout to auscultation.   ?Heart:  Regular rate and rhythm; no murmurs, clicks, rubs,  or gallops. ?Abdomen:  Soft, nontender and nondistended. Normal bowel sounds.   ?Neuro/Psych:  Alert and cooperative. Normal mood and affect. A and O x 3 ? ? ?_0  E. Carlean Purl, MD, [facility] ?[facility] ?[phone] (pager) ?05/18/2021 9:25 AM@ ? ?

## 2021-05-22 ENCOUNTER — Telehealth: Payer: Self-pay | Admitting: *Deleted

## 2021-05-22 ENCOUNTER — Encounter: Payer: Self-pay | Admitting: Internal Medicine

## 2021-05-22 DIAGNOSIS — Z86010 Personal history of colonic polyps: Secondary | ICD-10-CM

## 2021-05-22 NOTE — Telephone Encounter (Signed)
?  Follow up Call- ? ?Call back number 05/18/2021  ?Post procedure Call Back phone  # [phone]  ?Permission to leave phone message Yes  ?Some recent data might be hidden  ?  ? ?Patient questions: ? ?Do you have a fever, pain , or abdominal swelling? No. ?Pain Score  0 * ? ?Have you tolerated food without any problems? Yes.   ? ?Have you been able to return to your normal activities? Yes.   ? ?Do you have any questions about your discharge instructions: ?Diet   No. ?Medications  No. ?Follow up visit  No. ? ?Do you have questions or concerns about your Care? No. ? ?Actions: ?* If pain score is 4 or above: ?No action needed, pain <4. ? ? ?

## 2021-05-22 NOTE — Telephone Encounter (Signed)
No answer for post procedure follow up call left VM. ?

## 2021-05-23 ENCOUNTER — Encounter: Payer: PPO | Admitting: Occupational Therapy

## 2021-05-30 ENCOUNTER — Encounter: Payer: PPO | Admitting: Occupational Therapy

## 2021-06-06 ENCOUNTER — Encounter: Payer: PPO | Admitting: Occupational Therapy

## 2021-09-21 ENCOUNTER — Ambulatory Visit (HOSPITAL_BASED_OUTPATIENT_CLINIC_OR_DEPARTMENT_OTHER)
Admission: RE | Admit: 2021-09-21 | Discharge: 2021-09-21 | Disposition: A | Discharge status: Home / Self Care | Payer: PPO | Source: Ambulatory Visit | Attending: Internal Medicine | Admitting: Internal Medicine

## 2021-09-21 DIAGNOSIS — Z789 Other specified health status: Secondary | ICD-10-CM | POA: Diagnosis not present

## 2021-09-21 DIAGNOSIS — R911 Solitary pulmonary nodule: Secondary | ICD-10-CM | POA: Diagnosis not present

## 2021-09-27 ENCOUNTER — Ambulatory Visit: Payer: PPO | Admitting: Family Medicine

## 2021-10-08 ENCOUNTER — Ambulatory Visit (INDEPENDENT_AMBULATORY_CARE_PROVIDER_SITE_OTHER): Payer: PPO | Admitting: Family Medicine

## 2021-10-08 ENCOUNTER — Encounter: Payer: Self-pay | Admitting: Family Medicine

## 2021-10-08 VITALS — BP 140/88 | HR 79 | Temp 98.1°F | Resp 18 | Ht 63.0 in | Wt 170.2 lb

## 2021-10-08 DIAGNOSIS — R29898 Other symptoms and signs involving the musculoskeletal system: Secondary | ICD-10-CM

## 2021-10-08 DIAGNOSIS — M1990 Unspecified osteoarthritis, unspecified site: Secondary | ICD-10-CM

## 2021-10-08 DIAGNOSIS — E785 Hyperlipidemia, unspecified: Secondary | ICD-10-CM

## 2021-10-08 DIAGNOSIS — M722 Plantar fascial fibromatosis: Secondary | ICD-10-CM

## 2021-10-08 LAB — COMPREHENSIVE METABOLIC PANEL
ALT: 15 U/L (ref 0–35)
AST: 17 U/L (ref 0–37)
Albumin: 4.3 g/dL (ref 3.5–5.2)
Alkaline Phosphatase: 58 U/L (ref 39–117)
BUN: 14 mg/dL (ref 6–23)
CO2: 26 mEq/L (ref 19–32)
Calcium: 9.4 mg/dL (ref 8.4–10.5)
Chloride: 103 mEq/L (ref 96–112)
Creatinine, Ser: 0.83 mg/dL (ref 0.40–1.20)
GFR: 71.85 mL/min (ref >60.00)
Glucose, Bld: 99 mg/dL (ref 70–99)
Potassium: 4.4 mEq/L (ref 3.5–5.1)
Sodium: 138 mEq/L (ref 135–145)
Total Bilirubin: 0.7 mg/dL (ref 0.2–1.2)
Total Protein: 6.8 g/dL (ref 6.0–8.3)

## 2021-10-08 LAB — CBC WITH DIFFERENTIAL/PLATELET
Basophils Absolute: 0 10*3/uL (ref 0.0–0.1)
Basophils Relative: 0.7 % (ref 0.0–3.0)
Eosinophils Absolute: 0.1 10*3/uL (ref 0.0–0.7)
Eosinophils Relative: 1.8 % (ref 0.0–5.0)
HCT: 41.4 % (ref 36.0–46.0)
Hemoglobin: 13.5 g/dL (ref 12.0–15.0)
Lymphocytes Relative: 20.1 % (ref 12.0–46.0)
Lymphs Abs: 1.3 10*3/uL (ref 0.7–4.0)
MCHC: 32.7 g/dL (ref 30.0–36.0)
MCV: 95.4 fl (ref 78.0–100.0)
Monocytes Absolute: 0.6 10*3/uL (ref 0.1–1.0)
Monocytes Relative: 8.5 % (ref 3.0–12.0)
Neutro Abs: 4.5 10*3/uL (ref 1.4–7.7)
Neutrophils Relative %: 68.9 % (ref 43.0–77.0)
Platelets: 189 10*3/uL (ref 150.0–400.0)
RBC: 4.34 Mil/uL (ref 3.87–5.11)
RDW: 13.1 % (ref 11.5–15.5)
WBC: 6.6 10*3/uL (ref 4.0–10.5)

## 2021-10-08 LAB — LIPID PANEL
Cholesterol: 226 mg/dL — ABNORMAL HIGH (ref 0–200)
HDL: 99.2 mg/dL (ref >39.00)
LDL Cholesterol: 104 mg/dL — ABNORMAL HIGH (ref 0–99)
NonHDL: 126.68
Total CHOL/HDL Ratio: 2
Triglycerides: 114 mg/dL (ref 0.0–149.0)
VLDL: 22.8 mg/dL (ref 0.0–40.0)

## 2021-10-08 NOTE — Assessment & Plan Note (Signed)
Encourage heart healthy diet such as MIND or DASH diet, increase exercise, avoid trans fats, simple carbohydrates and processed foods, consider a krill or fish or flaxseed oil cap daily.  °

## 2021-10-08 NOTE — Progress Notes (Signed)
Subjective:   By signing my name below, I, Stephanie [name], attest that this documentation has been prepared under the direction and in the presence of Stephanie Held DO 10/08/2021   Patient ID: Stephanie [name], female    DOB: 22-Feb-1951, 71 y.o.   MRN: [id number]  Chief Complaint  Patient presents with   Hyperlipidemia   Follow-up    HPI Patient is in today for an office visit   As of today's visit, her blood pressure is elevating. She denies of any stress. She is currently taking 5 Mg of Prednisone. She states that when she tries to half her Prednisone medication, her plantar fasciitis reappears. She denies of any pain at the moment or swelling in her ankles.  BP Readings from Last 3 Encounters:  10/08/21 140/88  05/18/21 130/82  05/02/21 137/83   Pulse Readings from Last 3 Encounters:  10/08/21 79  05/18/21 72  05/02/21 87   She noticed in the past year that she lost all strength in her thighs. She is not interested in starting physical therapy. She does not have a gym membership at this moment.   She states that she is going to vacation in [place] in September.   She reports that the spot on her lungs is currently stable and unchanged.   Past Medical History:  Diagnosis Date   Arthritis    Cataract    bilateral sx   Complication of anesthesia    Per pt, hard to wake up past c-section/ 1 time.   DDD (degenerative disc disease)    in feet and hands   GERD (gastroesophageal reflux disease)    OTC PRN meds   Hx of colonic polyps 08/20/2016   Hyperlipidemia    on meds   Lactose intolerance    Osteopenia    Rheumatoid arthritis ([facility])    Seasonal allergies    Sleep apnea    does not use CPAP    Past Surgical History:  Procedure Laterality Date   CATARACT EXTRACTION Bilateral 2021   Dr. Herbert Deaner   CESAREAN SECTION     1980, 1982   COLONOSCOPY  2018   CG-MAC-Miralax (exc)-polyps   POLYPECTOMY  2018   polyps   TOTAL ABDOMINAL HYSTERECTOMY W/ BILATERAL  SALPINGOOPHORECTOMY      Family History  Problem Relation Age of Onset   Hypertension Mother    Dementia Mother    Arthritis Mother    Alzheimer's disease Mother    Arthritis Father    Liver disease Father        NASH   Hypertension Father    Arthritis Sister    Liver disease Sister    Arthritis Brother    Liver disease Paternal Aunt    Liver disease Paternal Aunt    Liver disease Paternal 30    Healthy Daughter    Healthy Son    Cystic fibrosis Other        niece   Colon cancer Neg Hx    Colon polyps Neg Hx    Esophageal cancer Neg Hx    Rectal cancer Neg Hx    Stomach cancer Neg Hx     Social History   Socioeconomic History   Marital status: Married    Spouse name: Not on file   Number of children: 2   Years of education: Not on file   Highest education level: Not on file  Occupational History   Occupation: self  Tobacco Use   Smoking status:  Never    Passive exposure: Never   Smokeless tobacco: Never  Vaping Use   Vaping Use: Never used  Substance and Sexual Activity   Alcohol use: Yes    Alcohol/week: 7.0 standard drinks of alcohol    Types: 7 Standard drinks or equivalent per week   Drug use: No   Sexual activity: Yes    Partners: Male  Other Topics Concern   Not on file  Social History Narrative   Married, 1 son and 1 daughter   Self-employed   2 caffienated beverages/day   Social Determinants of Health   Financial Resource Strain: Low Risk  (04/24/2021)   Overall Financial Resource Strain (CARDIA)    Difficulty of Paying Living Expenses: Not hard at all  Food Insecurity: No Food Insecurity (04/24/2021)   Hunger Vital Sign    Worried About Running Out of Food in the Last Year: Never true    [place] in the Last Year: Never true  Transportation Needs: No Transportation Needs (04/24/2021)   PRAPARE - Hydrologist (Medical): No    Lack of Transportation (Non-Medical): No  Physical Activity: Inactive  (04/24/2021)   Exercise Vital Sign    Days of Exercise per Week: 0 days    Minutes of Exercise per Session: 0 min  Stress: No Stress Concern Present (04/24/2021)   [place]    Feeling of Stress : Not at all  Social Connections: [place] (04/24/2021)   Social Connection and Isolation Panel [NHANES]    Frequency of Communication with Friends and Family: Twice a week    Frequency of Social Gatherings with Friends and Family: More than three times a week    Attends Religious Services: 1 to 4 times per year    Active Member of [employer] or Organizations: Yes    Attends Music therapist: More than 4 times per year    Marital Status: Married  Human resources officer Violence: Not At Risk (04/24/2021)   Humiliation, Afraid, Rape, and Kick questionnaire    Fear of Current or Ex-Partner: No    Emotionally Abused: No    Physically Abused: No    Sexually Abused: No    Outpatient Medications Prior to Visit  Medication Sig Dispense Refill   atorvastatin (LIPITOR) 10 MG tablet Take 1 tablet (10 mg total) by mouth daily. 90 tablet 3   CALCIUM PO Take 1 tablet by mouth daily as needed.     doxylamine, Sleep, (UNISOM) 25 MG tablet Take 25 mg by mouth at bedtime.     hydroxychloroquine (PLAQUENIL) 200 MG tablet TAKE 1 & 1/2 (ONE & ONE-HALF) TABLETS BY MOUTH ONCE DAILY 135 tablet 1   predniSONE (DELTASONE) 5 MG tablet Take 1 tablet (5 mg total) by mouth daily as needed. (Patient taking differently: Take 5 mg by mouth daily.) 90 tablet 1   rOPINIRole (REQUIP) 0.5 MG tablet 3 po qd (Patient taking differently: Take 0.5 mg by mouth 3 (three) times daily. 2 tabs _0 :30 pm and 1 tab @ 10:00 pm) 270 tablet 3   No facility-administered medications prior to visit.    Allergies  Allergen Reactions   Penicillins Anaphylaxis    Review of Systems  Constitutional:  Negative for fever and malaise/fatigue.  HENT:  Negative for  congestion.   Eyes:  Negative for blurred vision.  Respiratory:  Negative for cough and shortness of breath.   Cardiovascular:  Negative for chest  pain, palpitations and leg swelling.  Gastrointestinal:  Negative for vomiting.  Musculoskeletal:  Negative for back pain.  Skin:  Negative for rash.  Neurological:  Negative for loss of consciousness and headaches.       Objective:    Physical Exam Vitals and nursing note reviewed.  Constitutional:      General: She is not in acute distress.    Appearance: Normal appearance. She is not ill-appearing.  HENT:     Head: Normocephalic and atraumatic.     Right Ear: External ear normal.     Left Ear: External ear normal.  Eyes:     Extraocular Movements: Extraocular movements intact.     Pupils: Pupils are equal, round, and reactive to light.  Cardiovascular:     Rate and Rhythm: Normal rate and regular rhythm.     Heart sounds: Normal heart sounds. No murmur heard.    No gallop.  Pulmonary:     Effort: Pulmonary effort is normal. No respiratory distress.     Breath sounds: Normal breath sounds. No wheezing or rales.  Skin:    General: Skin is warm and dry.  Neurological:     Mental Status: She is alert and oriented to person, place, and time.  Psychiatric:        Judgment: Judgment normal.     BP 140/88   Pulse 79   Temp 98.1 F (36.7 C) (Oral)   Resp 18   Ht _0  (1.6 m)   Wt 170 lb 3.2 oz (77.2 kg)   SpO2 95%   BMI 30.15 kg/m  Wt Readings from Last 3 Encounters:  10/08/21 170 lb 3.2 oz (77.2 kg)  05/18/21 165 lb (74.8 kg)  05/04/21 165 lb (74.8 kg)    Diabetic Foot Exam - Simple   No data filed    Lab Results  Component Value Date   WBC 8.2 03/29/2021   HGB 12.8 03/29/2021   HCT 39.5 03/29/2021   PLT 215.0 03/29/2021   GLUCOSE 95 03/29/2021   CHOL 195 03/29/2021   TRIG 64.0 03/29/2021   HDL 100.30 03/29/2021   LDLDIRECT 103.2 10/15/2011   LDLCALC 81 03/29/2021   ALT 14 03/29/2021   AST 15 03/29/2021    NA 142 03/29/2021   K 4.7 03/29/2021   CL 106 03/29/2021   CREATININE 0.84 03/29/2021   BUN 15 03/29/2021   CO2 27 03/29/2021   TSH 2.42 06/02/2020    Lab Results  Component Value Date   TSH 2.42 06/02/2020   Lab Results  Component Value Date   WBC 8.2 03/29/2021   HGB 12.8 03/29/2021   HCT 39.5 03/29/2021   MCV 94.7 03/29/2021   PLT 215.0 03/29/2021   Lab Results  Component Value Date   NA 142 03/29/2021   K 4.7 03/29/2021   CO2 27 03/29/2021   GLUCOSE 95 03/29/2021   BUN 15 03/29/2021   CREATININE 0.84 03/29/2021   BILITOT 0.7 03/29/2021   ALKPHOS 56 03/29/2021   AST 15 03/29/2021   ALT 14 03/29/2021   PROT 6.7 03/29/2021   ALBUMIN 4.2 03/29/2021   CALCIUM 9.2 03/29/2021   EGFR 75 12/18/2020   GFR 71.09 03/29/2021   Lab Results  Component Value Date   CHOL 195 03/29/2021   Lab Results  Component Value Date   HDL 100.30 03/29/2021   Lab Results  Component Value Date   LDLCALC 81 03/29/2021   Lab Results  Component Value Date   TRIG 64.0 03/29/2021  Lab Results  Component Value Date   CHOLHDL 2 03/29/2021   No results found for: "HGBA1C"     Assessment & Plan:   Problem List Items Addressed This Visit       Unprioritized   Hyperlipidemia (Chronic)    Encourage heart healthy diet such as MIND or DASH diet, increase exercise, avoid trans fats, simple carbohydrates and processed foods, consider a krill or fish or flaxseed oil cap daily.       Relevant Orders   Comprehensive metabolic panel   Lipid panel   CBC with Differential/Platelet   Inflammatory arthritis - Primary   Relevant Orders   Ambulatory referral to Physical Therapy   Other Visit Diagnoses     Weakness of both lower extremities       Relevant Orders   Ambulatory referral to Physical Therapy   Plantar fasciitis, bilateral       Relevant Orders   Ambulatory referral to Physical Therapy        No orders of the defined types were placed in this encounter.   IAnn Held, DO, personally preformed the services described in this documentation.  All medical record entries made by the scribe were at my direction and in my presence.  I have reviewed the chart and discharge instructions (if applicable) and agree that the record reflects my personal performance and is accurate and complete. 10/08/2021   I,Amber Collins,acting as a scribe for [employer], DO.,have documented all relevant documentation on the behalf of Stephanie Held, DO,as directed by  Stephanie Held, DO while in the presence of Stephanie Held, DO.    Stephanie Held, DO

## 2021-10-09 ENCOUNTER — Encounter: Payer: Self-pay | Admitting: Family Medicine

## 2021-10-09 ENCOUNTER — Other Ambulatory Visit: Payer: Self-pay | Admitting: Family Medicine

## 2021-10-09 DIAGNOSIS — G2581 Restless legs syndrome: Secondary | ICD-10-CM

## 2021-10-09 MED ORDER — ROPINIROLE HCL 0.5 MG PO TABS: ORAL_TABLET | ORAL | 3 refills | Status: DC

## 2021-10-17 ENCOUNTER — Other Ambulatory Visit: Payer: Self-pay | Admitting: Family Medicine

## 2021-10-17 ENCOUNTER — Other Ambulatory Visit: Payer: Self-pay

## 2021-10-17 DIAGNOSIS — E785 Hyperlipidemia, unspecified: Secondary | ICD-10-CM

## 2021-10-17 MED ORDER — ATORVASTATIN CALCIUM 20 MG PO TABS: 20.0000 mg | ORAL_TABLET | Freq: Every day | ORAL | 1 refills | Status: DC

## 2021-11-06 ENCOUNTER — Ambulatory Visit (HOSPITAL_BASED_OUTPATIENT_CLINIC_OR_DEPARTMENT_OTHER): Payer: PPO | Attending: Family Medicine | Admitting: Physical Therapy

## 2021-11-06 ENCOUNTER — Other Ambulatory Visit: Payer: Self-pay

## 2021-11-06 ENCOUNTER — Encounter (HOSPITAL_BASED_OUTPATIENT_CLINIC_OR_DEPARTMENT_OTHER): Payer: Self-pay | Admitting: Physical Therapy

## 2021-11-06 DIAGNOSIS — R262 Difficulty in walking, not elsewhere classified: Secondary | ICD-10-CM | POA: Diagnosis not present

## 2021-11-06 DIAGNOSIS — M1990 Unspecified osteoarthritis, unspecified site: Secondary | ICD-10-CM | POA: Insufficient documentation

## 2021-11-06 DIAGNOSIS — M6281 Muscle weakness (generalized): Secondary | ICD-10-CM | POA: Diagnosis not present

## 2021-11-06 DIAGNOSIS — M722 Plantar fascial fibromatosis: Secondary | ICD-10-CM | POA: Diagnosis not present

## 2021-11-06 DIAGNOSIS — R29898 Other symptoms and signs involving the musculoskeletal system: Secondary | ICD-10-CM | POA: Insufficient documentation

## 2021-11-06 NOTE — Therapy (Signed)
OUTPATIENT PHYSICAL THERAPY LOWER EXTREMITY EVALUATION   Patient Name: Stephanie [name] MRN: [id number] DOB:[birth year], 71 y.o., female Today's Date: 11/06/2021   PT End of Session - 11/06/21 0932     Visit Number 1    Number of Visits 20    Date for PT Re-Evaluation 02/01/22    Authorization Type [employer]    PT Start Time 0930    PT Stop Time 1014    PT Time Calculation (min) 44 min    Activity Tolerance Patient tolerated treatment well    Behavior During Therapy WFL for tasks assessed/performed             Past Medical History:  Diagnosis Date   Arthritis    Cataract    bilateral sx   Complication of anesthesia    Per pt, hard to wake up past c-section/ 1 time.   DDD (degenerative disc disease)    in feet and hands   GERD (gastroesophageal reflux disease)    OTC PRN meds   Hx of colonic polyps 08/20/2016   Hyperlipidemia    on meds   Lactose intolerance    Osteopenia    Rheumatoid arthritis (HCC)    Seasonal allergies    Sleep apnea    does not use CPAP   Past Surgical History:  Procedure Laterality Date   CATARACT EXTRACTION Bilateral 2021   Dr. Herbert Deaner   CESAREAN SECTION     1980, 1982   COLONOSCOPY  2018   CG-MAC-Miralax (exc)-polyps   POLYPECTOMY  2018   polyps   TOTAL ABDOMINAL HYSTERECTOMY W/ BILATERAL SALPINGOOPHORECTOMY     Patient Active Problem List   Diagnosis Date Noted   SOB (shortness of breath) 09/25/2020   Inflammatory arthritis 08/09/2020   Bilateral wrist pain 07/11/2020   Fatigue 06/02/2020   Dizziness 06/02/2020   Arthralgia 06/02/2020   Palpitations 06/02/2020   Basal cell carcinoma (BCC) of forehead 04/24/2020   Lactose intolerance    Snoring 05/16/2018   OSA (obstructive sleep apnea) 05/16/2018   Restless legs 01/26/2018   Insomnia 01/26/2018   Snores 01/26/2018   Hx of colonic polyps 08/20/2016   BACK PAIN 02/29/2008   Hyperlipidemia 04/23/2007   Osteoarthritis 04/23/2007   [place] DISEASE, LUMBAR 04/23/2007     REFERRING PROVIDER: [name], DO  REFERRING DIAG:  M19.90 (ICD-10-CM) - Inflammatory arthritis  R29.898 (ICD-10-CM) - Weakness of both lower extremities  M72.2 (ICD-10-CM) - Plantar fasciitis, bilateral    THERAPY DIAG:  Muscle weakness (generalized)  Difficulty in walking, not elsewhere classified  Rationale for Evaluation and Treatment Rehabilitation  ONSET DATE: 1.5 yr ago  SUBJECTIVE:   SUBJECTIVE STATEMENT: About 1.5 yr ago dx with RA, meds have caused me to lose a lot of muscle mass and seeming to turn to fat. If I stoop down, I can't get back up. Need something to pull myself from the floor after playing with grand children. I am active, work in garden, a lot of days I get 10k steps. My feet are fine as long as I take my prednisone.   PERTINENT HISTORY: Osteopenia, RA  PAIN:  Are you having pain? No  PRECAUTIONS: None  WEIGHT BEARING RESTRICTIONS No  FALLS:  Has patient fallen in last 6 months? No  LIVING ENVIRONMENT: Lives with: lives with their family No stairs at home  OCCUPATION: n/a  PLOF: Independent  PATIENT GOALS increase strength, get up and down from floor.    OBJECTIVE:   PATIENT SURVEYS:  LEFS 69  MUSCLE LENGTH: WFL  POSTURE: anteiror pelvic tilt     LOWER EXTREMITY MMT:  MMT (lb) Right eval Left eval  Hip flexion    Hip extension    Hip abduction- in SL above knee 34 18.8  Hip adduction    Hip internal rotation    Hip external rotation    Knee flexion 27 24.2  Knee extension 42.7 44.8   (Blank rows = not tested)    FUNCTIONAL TESTS:  5 times sit to stand: 13    TODAY'S TREATMENT: Ab set Marching prog to alt knee ext Supine leg circles Sidleying diag hip abd   PATIENT EDUCATION:  Education details: Anatomy of condition, POC, HEP, exercise form/rationale Person educated: Patient Education method: Explanation, Demonstration, Tactile cues, Verbal cues, and Handouts Education comprehension:  verbalized understanding, returned demonstration, verbal cues required, tactile cues required, and needs further education   HOME EXERCISE PROGRAM: [id number]  ASSESSMENT:  CLINICAL IMPRESSION: Patient is a 71 y.o. F who was seen today for physical therapy evaluation and treatment for generalized, progressive weakness since beginning RA medications.    OBJECTIVE IMPAIRMENTS decreased activity tolerance, decreased balance, decreased endurance, difficulty walking, decreased strength, improper body mechanics, postural dysfunction, and pain.   ACTIVITY LIMITATIONS lifting, bending, standing, squatting, stairs, bed mobility, and locomotion level  PARTICIPATION LIMITATIONS: meal prep, cleaning, laundry, shopping, and community activity  PERSONAL FACTORS 1-2 comorbidities: see PMH  are also affecting patient's functional outcome.   REHAB POTENTIAL: Good  CLINICAL DECISION MAKING: Evolving/moderate complexity  EVALUATION COMPLEXITY: Moderate   GOALS: Goals reviewed with patient? Yes  SHORT TERM GOALS: Target date: 11/27/2021  Pt will be prepared with management program in order to tolerate trip to [place] Baseline: Goal status: INITIAL   LONG TERM GOALS: Target date: 02/01/22  Meed age appropriate strength goals via handheld dynamometry Baseline: goals: knee ext- 60lb, hip abd- 38lb Goal status: INITIAL  2.  LEFS to improve by MDC Baseline: MCD is 9 points Goal status: INITIAL  3.  Pt will be able to get up from the floor with min A from a piece of furniture Baseline: reports needing to pull herself up at eval Goal status: INITIAL  4.  5TSTS 10s or less Baseline:  Goal status: INITIAL     PLAN: PT FREQUENCY: 1-2x/week  PT DURATION: 10 weeks  PLANNED INTERVENTIONS: Therapeutic exercises, Therapeutic activity, Neuromuscular re-education, Balance training, Gait training, Patient/Family education, Self Care, Joint mobilization, Stair training, Aquatic Therapy, Dry  Needling, Electrical stimulation, Spinal mobilization, Cryotherapy, Moist heat, Taping, and Manual therapy  PLAN FOR NEXT SESSION: continue core stability training, gross quad activation, begin evaluating ability to get up/down from floor  Giuseppina Quinones C. Ardis Lawley PT, DPT 11/06/21 2:25 PM

## 2021-11-14 ENCOUNTER — Encounter (HOSPITAL_BASED_OUTPATIENT_CLINIC_OR_DEPARTMENT_OTHER): Payer: Self-pay | Admitting: Physical Therapy

## 2021-11-14 ENCOUNTER — Ambulatory Visit (HOSPITAL_BASED_OUTPATIENT_CLINIC_OR_DEPARTMENT_OTHER): Payer: PPO | Attending: Family Medicine | Admitting: Physical Therapy

## 2021-11-14 DIAGNOSIS — M6281 Muscle weakness (generalized): Secondary | ICD-10-CM | POA: Insufficient documentation

## 2021-11-14 DIAGNOSIS — R262 Difficulty in walking, not elsewhere classified: Secondary | ICD-10-CM | POA: Insufficient documentation

## 2021-11-14 DIAGNOSIS — R278 Other lack of coordination: Secondary | ICD-10-CM | POA: Diagnosis not present

## 2021-11-14 NOTE — Therapy (Addendum)
OUTPATIENT PHYSICAL THERAPY LOWER EXTREMITY EVALUATION   Patient Name: Stephanie [name] MRN: [id number] DOB:1950-06-30, 71 y.o., female Today's Date: 11/14/2021   PT End of Session - 11/14/21 0807     Visit Number 2    Number of Visits 20    Date for PT Re-Evaluation 02/01/22    Authorization Type [employer]    PT Start Time 0801    PT Stop Time 0839    PT Time Calculation (min) 38 min    Activity Tolerance Patient tolerated treatment well    Behavior During Therapy [facility] for tasks assessed/performed              Past Medical History:  Diagnosis Date   Arthritis    Cataract    bilateral sx   Complication of anesthesia    Per pt, hard to wake up past c-section/ 1 time.   DDD (degenerative disc disease)    in feet and hands   GERD (gastroesophageal reflux disease)    OTC PRN meds   Hx of colonic polyps 08/20/2016   Hyperlipidemia    on meds   Lactose intolerance    Osteopenia    Rheumatoid arthritis (HCC)    Seasonal allergies    Sleep apnea    does not use CPAP   Past Surgical History:  Procedure Laterality Date   CATARACT EXTRACTION Bilateral 2021   Dr. Herbert Deaner   CESAREAN SECTION     1980, 1982   COLONOSCOPY  2018   CG-MAC-Miralax (exc)-polyps   POLYPECTOMY  2018   polyps   TOTAL ABDOMINAL HYSTERECTOMY W/ BILATERAL SALPINGOOPHORECTOMY     Patient Active Problem List   Diagnosis Date Noted   SOB (shortness of breath) 09/25/2020   Inflammatory arthritis 08/09/2020   Bilateral wrist pain 07/11/2020   Fatigue 06/02/2020   Dizziness 06/02/2020   Arthralgia 06/02/2020   Palpitations 06/02/2020   Basal cell carcinoma (BCC) of forehead 04/24/2020   Lactose intolerance    Snoring 05/16/2018   OSA (obstructive sleep apnea) 05/16/2018   Restless legs 01/26/2018   Insomnia 01/26/2018   Snores 01/26/2018   Hx of colonic polyps 08/20/2016   BACK PAIN 02/29/2008   Hyperlipidemia 04/23/2007   Osteoarthritis 04/23/2007   [place] DISEASE, LUMBAR 04/23/2007     REFERRING PROVIDER: [name], DO  REFERRING DIAG:  M19.90 (ICD-10-CM) - Inflammatory arthritis  R29.898 (ICD-10-CM) - Weakness of both lower extremities  M72.2 (ICD-10-CM) - Plantar fasciitis, bilateral    THERAPY DIAG:  Muscle weakness (generalized)  Difficulty in walking, not elsewhere classified  Other lack of coordination  Rationale for Evaluation and Treatment Rehabilitation  ONSET DATE: 1.5 yr ago  SUBJECTIVE:   SUBJECTIVE STATEMENT:  Feeling good this morning, HEP is OK. Nothing new going on. Pain is OK as long as I take my prednisone.    PERTINENT HISTORY: Osteopenia, RA  PAIN:  Are you having pain? No and Yes: NPRS scale: 3/10 Pain location: B wrists  Pain description: "like 17 or 71 year old wrists burning pain"  Aggravating factors: overactivity  Relieving factors: hot water  PRECAUTIONS: None  WEIGHT BEARING RESTRICTIONS No  FALLS:  Has patient fallen in last 6 months? No  LIVING ENVIRONMENT: Lives with: lives with their family No stairs at home  OCCUPATION: n/a  PLOF: Independent  PATIENT GOALS increase strength, get up and down from floor.    OBJECTIVE:   PATIENT SURVEYS:  LEFS 69  MUSCLE LENGTH: WFL  POSTURE: anteiror pelvic tilt  LOWER EXTREMITY MMT:  MMT (lb) Right eval Left eval  Hip flexion    Hip extension    Hip abduction- in SL above knee 34 18.8  Hip adduction    Hip internal rotation    Hip external rotation    Knee flexion 27 24.2  Knee extension 42.7 44.8   (Blank rows = not tested)    FUNCTIONAL TESTS:  5 times sit to stand: 13    TODAY'S TREATMENT:  11/14/21  TherEx:  Nustep L4 x6 minutes BLEs only   - bridge with hip ABD into red TB 1x10  - sidelying clams x10 B red TB  - prone hip extensions 1x10 B red TB  - prone HS curls red TB 1x10 B            - walking bridges x10            - PPT with 3 second holds x15           - PPT with march x10 B          - PPT with SLR  x10 B           - PPT with opposite UE/LE raise x10 B            - LAQs with red TB 2x10 B slow controlled eccentric lower           - standing 3 way hips red TB above knees x10 B with TrA set           - hip hikes x10 B, then + hip hikes with ABD x10 B            - standing marches with TrA set x10 B light BUE support              Eval Ab set Marching prog to alt knee ext Supine leg circles Sidleying diag hip abd   PATIENT EDUCATION:  Education details: Anatomy of condition, POC, HEP, exercise form/rationale Person educated: Patient Education method: Explanation, Demonstration, Tactile cues, Verbal cues, and Handouts Education comprehension: verbalized understanding, returned demonstration, verbal cues required, tactile cues required, and needs further education   HOME EXERCISE PROGRAM: [id number]  ASSESSMENT:  CLINICAL IMPRESSION:  Pam arrives doing OK, no big changes since her evaluation, HEP is doing "OK". Warmed up on the Nustep then focused on core, proximal, and quad strength as per POC. Needed a lot of multimodal cues for correct exercise performance today, kinesthetic awareness quite impaired. Did well but did need intermittent rest breaks due to fatigue. Will continue to progress as able and tolerated.     OBJECTIVE IMPAIRMENTS decreased activity tolerance, decreased balance, decreased endurance, difficulty walking, decreased strength, improper body mechanics, postural dysfunction, and pain.   ACTIVITY LIMITATIONS lifting, bending, standing, squatting, stairs, bed mobility, and locomotion level  PARTICIPATION LIMITATIONS: meal prep, cleaning, laundry, shopping, and community activity  PERSONAL FACTORS 1-2 comorbidities: see PMH  are also affecting patient's functional outcome.   REHAB POTENTIAL: Good  CLINICAL DECISION MAKING: Evolving/moderate complexity  EVALUATION COMPLEXITY: Moderate   GOALS: Goals reviewed with patient? Yes  SHORT TERM GOALS: Target  date: 11/27/2021  Pt will be prepared with management program in order to tolerate trip to [place] Baseline: Goal status: INITIAL   LONG TERM GOALS: Target date: 02/01/22  Meed age appropriate strength goals via handheld dynamometry Baseline: goals: knee ext- 60lb, hip abd- 38lb Goal status: INITIAL  2.  LEFS to improve by MDC Baseline:  MCD is 9 points Goal status: INITIAL  3.  Pt will be able to get up from the floor with min A from a piece of furniture Baseline: reports needing to pull herself up at eval Goal status: INITIAL  4.  5TSTS 10s or less Baseline:  Goal status: INITIAL     PLAN: PT FREQUENCY: 1-2x/week  PT DURATION: 10 weeks  PLANNED INTERVENTIONS: Therapeutic exercises, Therapeutic activity, Neuromuscular re-education, Balance training, Gait training, Patient/Family education, Self Care, Joint mobilization, Stair training, Aquatic Therapy, Dry Needling, Electrical stimulation, Spinal mobilization, Cryotherapy, Moist heat, Taping, and Manual therapy  PLAN FOR NEXT SESSION: continue core stability training, gross quad activation, begin evaluating ability to get up/down from floor   Shaletha Humble U PT DPT PN2  11/14/2021, 8:40 AM  PHYSICAL THERAPY DISCHARGE SUMMARY  Visits from Start of Care: 2  Current functional level related to goals / functional outcomes: See above   Remaining deficits: See above   Education / Equipment: Anatomy of condition, POC, HEP, exercise form/rationale    Patient agrees to discharge. Patient goals were  "feeling better" per mychart cancel reason . Patient is being discharged due to being pleased with the current functional level. Jessica C. Hightower PT, DPT 05/02/22 9:21 AM

## 2021-11-19 ENCOUNTER — Ambulatory Visit (HOSPITAL_BASED_OUTPATIENT_CLINIC_OR_DEPARTMENT_OTHER): Payer: PPO | Admitting: Physical Therapy

## 2021-11-19 ENCOUNTER — Encounter (HOSPITAL_BASED_OUTPATIENT_CLINIC_OR_DEPARTMENT_OTHER): Payer: Self-pay

## 2021-12-17 ENCOUNTER — Ambulatory Visit (HOSPITAL_BASED_OUTPATIENT_CLINIC_OR_DEPARTMENT_OTHER): Payer: PPO | Admitting: Physical Therapy

## 2021-12-19 ENCOUNTER — Encounter (HOSPITAL_BASED_OUTPATIENT_CLINIC_OR_DEPARTMENT_OTHER): Payer: PPO | Admitting: Physical Therapy

## 2021-12-26 ENCOUNTER — Encounter (HOSPITAL_BASED_OUTPATIENT_CLINIC_OR_DEPARTMENT_OTHER): Payer: PPO | Admitting: Physical Therapy

## 2022-01-02 ENCOUNTER — Encounter (HOSPITAL_BASED_OUTPATIENT_CLINIC_OR_DEPARTMENT_OTHER): Payer: PPO | Admitting: Physical Therapy

## 2022-01-09 ENCOUNTER — Encounter (HOSPITAL_BASED_OUTPATIENT_CLINIC_OR_DEPARTMENT_OTHER): Payer: PPO | Admitting: Physical Therapy

## 2022-01-16 ENCOUNTER — Encounter (HOSPITAL_BASED_OUTPATIENT_CLINIC_OR_DEPARTMENT_OTHER): Payer: PPO | Admitting: Physical Therapy

## 2022-01-23 ENCOUNTER — Encounter (HOSPITAL_BASED_OUTPATIENT_CLINIC_OR_DEPARTMENT_OTHER): Payer: PPO | Admitting: Physical Therapy

## 2022-04-02 DIAGNOSIS — I788 Other diseases of capillaries: Secondary | ICD-10-CM | POA: Diagnosis not present

## 2022-04-02 DIAGNOSIS — D2372 Other benign neoplasm of skin of left lower limb, including hip: Secondary | ICD-10-CM | POA: Diagnosis not present

## 2022-04-02 DIAGNOSIS — L814 Other melanin hyperpigmentation: Secondary | ICD-10-CM | POA: Diagnosis not present

## 2022-04-02 DIAGNOSIS — Z85828 Personal history of other malignant neoplasm of skin: Secondary | ICD-10-CM | POA: Diagnosis not present

## 2022-04-02 DIAGNOSIS — D1801 Hemangioma of skin and subcutaneous tissue: Secondary | ICD-10-CM | POA: Diagnosis not present

## 2022-04-02 DIAGNOSIS — D2261 Melanocytic nevi of right upper limb, including shoulder: Secondary | ICD-10-CM | POA: Diagnosis not present

## 2022-04-02 DIAGNOSIS — D225 Melanocytic nevi of trunk: Secondary | ICD-10-CM | POA: Diagnosis not present

## 2022-04-02 DIAGNOSIS — L570 Actinic keratosis: Secondary | ICD-10-CM | POA: Diagnosis not present

## 2022-04-02 DIAGNOSIS — D485 Neoplasm of uncertain behavior of skin: Secondary | ICD-10-CM | POA: Diagnosis not present

## 2022-04-02 DIAGNOSIS — L820 Inflamed seborrheic keratosis: Secondary | ICD-10-CM | POA: Diagnosis not present

## 2022-04-12 ENCOUNTER — Telehealth: Payer: Self-pay | Admitting: Family Medicine

## 2022-04-12 NOTE — Telephone Encounter (Signed)
Copied from [place] [id number]. Topic: Medicare AWV >> Apr 12, 2022  2:19 PM Devoria Glassing wrote: Reason for CRM: Left message for patient to schedule Annual Wellness Visit(AWV).  Please schedule with Health Nurse Advisor at [facility]. Please call [phone] ask for [facility].

## 2022-04-25 ENCOUNTER — Ambulatory Visit (INDEPENDENT_AMBULATORY_CARE_PROVIDER_SITE_OTHER): Payer: PPO | Admitting: Family Medicine

## 2022-04-25 ENCOUNTER — Encounter: Payer: Self-pay | Admitting: Family Medicine

## 2022-04-25 VITALS — BP 124/80 | HR 76 | Temp 98.1°F | Resp 18 | Ht 63.0 in | Wt 169.6 lb

## 2022-04-25 DIAGNOSIS — G2581 Restless legs syndrome: Secondary | ICD-10-CM | POA: Diagnosis not present

## 2022-04-25 DIAGNOSIS — M1990 Unspecified osteoarthritis, unspecified site: Secondary | ICD-10-CM

## 2022-04-25 DIAGNOSIS — E785 Hyperlipidemia, unspecified: Secondary | ICD-10-CM | POA: Diagnosis not present

## 2022-04-25 DIAGNOSIS — Z0000 Encounter for general adult medical examination without abnormal findings: Secondary | ICD-10-CM

## 2022-04-25 LAB — COMPREHENSIVE METABOLIC PANEL
ALT: 18 U/L (ref 0–35)
AST: 21 U/L (ref 0–37)
Albumin: 4.3 g/dL (ref 3.5–5.2)
Alkaline Phosphatase: 70 U/L (ref 39–117)
BUN: 15 mg/dL (ref 6–23)
CO2: 25 mEq/L (ref 19–32)
Calcium: 9.4 mg/dL (ref 8.4–10.5)
Chloride: 105 mEq/L (ref 96–112)
Creatinine, Ser: 0.83 mg/dL (ref 0.40–1.20)
GFR: 71.58 mL/min (ref >60.00)
Glucose, Bld: 91 mg/dL (ref 70–99)
Potassium: 4.6 mEq/L (ref 3.5–5.1)
Sodium: 138 mEq/L (ref 135–145)
Total Bilirubin: 0.7 mg/dL (ref 0.2–1.2)
Total Protein: 6.8 g/dL (ref 6.0–8.3)

## 2022-04-25 LAB — CBC WITH DIFFERENTIAL/PLATELET
Basophils Absolute: 0 10*3/uL (ref 0.0–0.1)
Basophils Relative: 0.9 % (ref 0.0–3.0)
Eosinophils Absolute: 0.1 10*3/uL (ref 0.0–0.7)
Eosinophils Relative: 2 % (ref 0.0–5.0)
HCT: 42.2 % (ref 36.0–46.0)
Hemoglobin: 13.9 g/dL (ref 12.0–15.0)
Lymphocytes Relative: 30.5 % (ref 12.0–46.0)
Lymphs Abs: 1.6 10*3/uL (ref 0.7–4.0)
MCHC: 32.9 g/dL (ref 30.0–36.0)
MCV: 93 fl (ref 78.0–100.0)
Monocytes Absolute: 0.6 10*3/uL (ref 0.1–1.0)
Monocytes Relative: 11.1 % (ref 3.0–12.0)
Neutro Abs: 2.9 10*3/uL (ref 1.4–7.7)
Neutrophils Relative %: 55.5 % (ref 43.0–77.0)
Platelets: 224 10*3/uL (ref 150.0–400.0)
RBC: 4.54 Mil/uL (ref 3.87–5.11)
RDW: 12.6 % (ref 11.5–15.5)
WBC: 5.1 10*3/uL (ref 4.0–10.5)

## 2022-04-25 LAB — LIPID PANEL
Cholesterol: 193 mg/dL (ref 0–200)
HDL: 80.4 mg/dL (ref >39.00)
LDL Cholesterol: 99 mg/dL (ref 0–99)
NonHDL: 112.57
Total CHOL/HDL Ratio: 2
Triglycerides: 67 mg/dL (ref 0.0–149.0)
VLDL: 13.4 mg/dL (ref 0.0–40.0)

## 2022-04-25 MED ORDER — ATORVASTATIN CALCIUM 20 MG PO TABS: 20.0000 mg | ORAL_TABLET | Freq: Every day | ORAL | 1 refills | Status: DC

## 2022-04-25 MED ORDER — ROPINIROLE HCL 0.5 MG PO TABS: ORAL_TABLET | ORAL | 3 refills | Status: DC

## 2022-04-25 MED ORDER — HYDROXYCHLOROQUINE SULFATE 200 MG PO TABS: ORAL_TABLET | ORAL | 1 refills | Status: DC

## 2022-04-25 NOTE — Assessment & Plan Note (Signed)
Con't requip

## 2022-04-25 NOTE — Assessment & Plan Note (Signed)
Per rheum  On plaquenil

## 2022-04-25 NOTE — Progress Notes (Addendum)
Subjective:   By signing my name below, I, Stephanie [name], attest that this documentation has been prepared under the direction and in the presence of [name], DO. 04/25/2022   Patient ID: Stephanie [name], female    DOB: [birth year], 72 y.o.   MRN: [id number]  Chief Complaint  Patient presents with   Annual Exam    Pt states fasting     HPI Patient is in today for a comprehensive physical exam.   She is not longer taking prednisone at this time.  She continues following up with her dermatologist regularly.  She denies fever, new moles, congestion, sinus pain, sore throat, chest pain, palpitations, cough, shortness of breath, wheezing, nausea, vomiting, abdominal pain, diarrhea, constipation, dysuria, frequency, hematuria, new muscle pain, new joint pain, or headaches at this time.  She has no changes to her family medical history. She has no new surgical procedures to report.  She is not interested in receiving the shingles vaccines. She does not have the latest Covid-19 booster vaccine.  Her last mammogram was 04/02/2021. Results were normal. Repeat in 1 year.  Her last bone density scan was 04/02/2021. Results showed she is osteopenic. Repeat in 2 years.  Last colonoscopy was 05/18/2021. Results showed one 7 mm polyp in the rectum, three 3-10 mm polyps in the ascending colon and in the cecum, diverticulosis in the sigmoid colon, otherwise results are normal.    Past Medical History:  Diagnosis Date   Arthritis    Cataract    bilateral sx   Complication of anesthesia    Per pt, hard to wake up past c-section/ 1 time.   DDD (degenerative disc disease)    in feet and hands   GERD (gastroesophageal reflux disease)    OTC PRN meds   Hx of colonic polyps 08/20/2016   Hyperlipidemia    on meds   Lactose intolerance    Osteopenia    Rheumatoid arthritis (HCC)    Seasonal allergies    Sleep apnea    does not use CPAP    Past Surgical History:  Procedure Laterality  Date   CATARACT EXTRACTION Bilateral 2021   Dr. Herbert Deaner   CESAREAN SECTION     1980, 1982   COLONOSCOPY  2018   CG-MAC-Miralax (exc)-polyps   POLYPECTOMY  2018   polyps   TOTAL ABDOMINAL HYSTERECTOMY W/ BILATERAL SALPINGOOPHORECTOMY      Family History  Problem Relation Age of Onset   Hypertension Mother    Dementia Mother    Arthritis Mother    Alzheimer's disease Mother    Arthritis Father    Liver disease Father        NASH   Hypertension Father    Arthritis Sister    Liver disease Sister    Arthritis Brother    Liver disease Brother    Healthy Daughter    Healthy Son    Liver disease Paternal Aunt    Liver disease Paternal Aunt    Liver disease Paternal Aunt    Cystic fibrosis Other        niece   Colon cancer Neg Hx    Colon polyps Neg Hx    Esophageal cancer Neg Hx    Rectal cancer Neg Hx    Stomach cancer Neg Hx     Social History   Socioeconomic History   Marital status: Married    Spouse name: Not on file   Number of children: 2   Years of  education: Not on file   Highest education level: Not on file  Occupational History   Occupation: self  Tobacco Use   Smoking status: Never    Passive exposure: Never   Smokeless tobacco: Never  Vaping Use   Vaping Use: Never used  Substance and Sexual Activity   Alcohol use: Yes    Alcohol/week: 7.0 standard drinks of alcohol    Types: 7 Standard drinks or equivalent per week   Drug use: No   Sexual activity: Yes    Partners: Male  Other Topics Concern   Not on file  Social History Narrative   Married, 1 son and 1 daughter   Self-employed   2 caffienated beverages/day   Social Determinants of Health   Financial Resource Strain: Low Risk  (04/24/2021)   Overall Financial Resource Strain (CARDIA)    Difficulty of Paying Living Expenses: Not hard at all  Food Insecurity: No Food Insecurity (04/24/2021)   Hunger Vital Sign    Worried About Running Out of Food in the Last Year: Never true    [place] in the Last Year: Never true  Transportation Needs: No Transportation Needs (04/24/2021)   PRAPARE - Hydrologist (Medical): No    Lack of Transportation (Non-Medical): No  Physical Activity: Inactive (04/24/2021)   Exercise Vital Sign    Days of Exercise per Week: 0 days    Minutes of Exercise per Session: 0 min  Stress: No Stress Concern Present (04/24/2021)   [place]    Feeling of Stress : Not at all  Social Connections: [place] (04/24/2021)   Social Connection and Isolation Panel [NHANES]    Frequency of Communication with Friends and Family: Twice a week    Frequency of Social Gatherings with Friends and Family: More than three times a week    Attends Religious Services: 1 to 4 times per year    Active Member of [employer] or Organizations: Yes    Attends Music therapist: More than 4 times per year    Marital Status: Married  Human resources officer Violence: Not At Risk (04/24/2021)   Humiliation, Afraid, Rape, and Kick questionnaire    Fear of Current or Ex-Partner: No    Emotionally Abused: No    Physically Abused: No    Sexually Abused: No    Outpatient Medications Prior to Visit  Medication Sig Dispense Refill   CALCIUM PO Take 1 tablet by mouth daily as needed.     doxylamine, Sleep, (UNISOM) 25 MG tablet Take 25 mg by mouth at bedtime.     atorvastatin (LIPITOR) 20 MG tablet Take 1 tablet (20 mg total) by mouth daily. 90 tablet 1   hydroxychloroquine (PLAQUENIL) 200 MG tablet TAKE 1 & 1/2 (ONE & ONE-HALF) TABLETS BY MOUTH ONCE DAILY 135 tablet 1   rOPINIRole (REQUIP) 0.5 MG tablet 4 po qd 480 tablet 3   predniSONE (DELTASONE) 5 MG tablet Take 1 tablet (5 mg total) by mouth daily as needed. (Patient not taking: Reported on 04/25/2022) 90 tablet 1   No facility-administered medications prior to visit.    Allergies  Allergen Reactions   Penicillins  Anaphylaxis    Review of Systems  Constitutional:  Negative for fever and malaise/fatigue.  HENT:  Negative for congestion, sinus pain and sore throat.   Eyes:  Negative for blurred vision.  Respiratory:  Negative for cough, shortness of breath and wheezing.  Cardiovascular:  Negative for chest pain, palpitations and leg swelling.  Gastrointestinal:  Negative for abdominal pain, blood in stool, constipation, diarrhea, nausea and vomiting.  Genitourinary:  Negative for dysuria, frequency and hematuria.  Musculoskeletal:  Negative for falls.       (-)new muscle pain (-)new joint pain  Skin:  Negative for rash.       (-)New moles  Neurological:  Negative for dizziness, loss of consciousness and headaches.  Endo/Heme/Allergies:  Negative for environmental allergies.  Psychiatric/Behavioral:  Negative for depression. The patient is not nervous/anxious.        Objective:    Physical Exam Vitals and nursing note reviewed.  Constitutional:      General: She is not in acute distress.    Appearance: Normal appearance. She is not ill-appearing.  HENT:     Head: Normocephalic and atraumatic.     Right Ear: Tympanic membrane, ear canal and external ear normal.     Left Ear: Tympanic membrane, ear canal and external ear normal.  Eyes:     Extraocular Movements: Extraocular movements intact.     Pupils: Pupils are equal, round, and reactive to light.  Cardiovascular:     Rate and Rhythm: Normal rate and regular rhythm.     Heart sounds: Normal heart sounds. No murmur heard.    No gallop.  Pulmonary:     Effort: Pulmonary effort is normal. No respiratory distress.     Breath sounds: Normal breath sounds. No wheezing or rales.  Abdominal:     General: Bowel sounds are normal. There is no distension.     Palpations: Abdomen is soft.     Tenderness: There is no abdominal tenderness. There is no guarding.  Skin:    General: Skin is warm and dry.  Neurological:     General: No focal  deficit present.     Mental Status: She is alert and oriented to person, place, and time.  Psychiatric:        Judgment: Judgment normal.     BP 124/80 (BP Location: Left Arm, Patient Position: Sitting)   Pulse 76   Temp 98.1 F (36.7 C) (Oral)   Resp 18   Ht 5' 3"$  (1.6 m)   Wt 169 lb 9.6 oz (76.9 kg)   SpO2 97%   BMI 30.04 kg/m  Wt Readings from Last 3 Encounters:  04/25/22 169 lb 9.6 oz (76.9 kg)  10/08/21 170 lb 3.2 oz (77.2 kg)  05/18/21 165 lb (74.8 kg)       Assessment & Plan:  Preventative health care Assessment & Plan: Ghm utd Check labs    RLS (restless legs syndrome) Assessment & Plan: Con't requip   Orders: -     rOPINIRole HCl; 4 po qd  Dispense: 480 tablet; Refill: 3  Hyperlipidemia, unspecified hyperlipidemia type Assessment & Plan: Encourage heart healthy diet such as MIND or DASH diet, increase exercise, avoid trans fats, simple carbohydrates and processed foods, consider a krill or fish or flaxseed oil cap daily.    Orders: -     Atorvastatin Calcium; Take 1 tablet (20 mg total) by mouth daily.  Dispense: 90 tablet; Refill: 1 -     CBC with Differential/Platelet -     Comprehensive metabolic panel -     Lipid panel  Inflammatory arthritis Assessment & Plan: Per rheum  On plaquenil   Orders: -     Hydroxychloroquine Sulfate; TAKE 1 & 1/2 (ONE & ONE-HALF) TABLETS BY MOUTH ONCE DAILY  Dispense: 135 tablet; Refill: 1    I, [name], DO, personally preformed the services described in this documentation.  All medical record entries made by the scribe were at my direction and in my presence.  I have reviewed the chart and discharge instructions (if applicable) and agree that the record reflects my personal performance and is accurate and complete. 04/25/2022   I,Stephanie [name],acting as a scribe for [name], DO.,have documented all relevant documentation on the behalf of [name], DO,as directed by  [name], DO while in the presence of [name], DO.   [name], DO

## 2022-04-25 NOTE — Patient Instructions (Signed)
Preventive Care 65 Years and Older, Female Preventive care refers to lifestyle choices and visits with your health care provider that can promote health and wellness. Preventive care visits are also called wellness exams. What can I expect for my preventive care visit? Counseling Your health care provider may ask you questions about your: Medical history, including: Past medical problems. Family medical history. Pregnancy and menstrual history. History of falls. Current health, including: Memory and ability to understand (cognition). Emotional well-being. Home life and relationship well-being. Sexual activity and sexual health. Lifestyle, including: Alcohol, nicotine or tobacco, and drug use. Access to firearms. Diet, exercise, and sleep habits. Work and work environment. Sunscreen use. Safety issues such as seatbelt and bike helmet use. Physical exam Your health care provider will check your: Height and weight. These may be used to calculate your BMI (body mass index). BMI is a measurement that tells if you are at a healthy weight. Waist circumference. This measures the distance around your waistline. This measurement also tells if you are at a healthy weight and may help predict your risk of certain diseases, such as type 2 diabetes and high blood pressure. Heart rate and blood pressure. Body temperature. Skin for abnormal spots. What immunizations do I need?  Vaccines are usually given at various ages, according to a schedule. Your health care provider will recommend vaccines for you based on your age, medical history, and lifestyle or other factors, such as travel or where you work. What tests do I need? Screening Your health care provider may recommend screening tests for certain conditions. This may include: Lipid and cholesterol levels. Hepatitis C test. Hepatitis B test. HIV (human immunodeficiency virus) test. STI (sexually transmitted infection) testing, if you are at  risk. Lung cancer screening. Colorectal cancer screening. Diabetes screening. This is done by checking your blood sugar (glucose) after you have not eaten for a while (fasting). Mammogram. Talk with your health care provider about how often you should have regular mammograms. BRCA-related cancer screening. This may be done if you have a family history of breast, ovarian, tubal, or peritoneal cancers. Bone density scan. This is done to screen for osteoporosis. Talk with your health care provider about your test results, treatment options, and if necessary, the need for more tests. Follow these instructions at home: Eating and drinking  Eat a diet that includes fresh fruits and vegetables, whole grains, lean protein, and low-fat dairy products. Limit your intake of foods with high amounts of sugar, saturated fats, and salt. Take vitamin and mineral supplements as recommended by your health care provider. Do not drink alcohol if your health care provider tells you not to drink. If you drink alcohol: Limit how much you have to 0-1 drink a day. Know how much alcohol is in your drink. In the [place] one drink equals one 12 oz bottle of beer (355 mL), one 5 oz glass of wine (148 mL), or one 1 oz glass of hard liquor (44 mL). Lifestyle Brush your teeth every morning and night with fluoride toothpaste. Floss one time each day. Exercise for at least 30 minutes 5 or more days each week. Do not use any products that contain nicotine or tobacco. These products include cigarettes, chewing tobacco, and vaping devices, such as e-cigarettes. If you need help quitting, ask your health care provider. Do not use drugs. If you are sexually active, practice safe sex. Use a condom or other form of protection in order to prevent STIs. Take aspirin only as told by   your health care provider. Make sure that you understand how much to take and what form to take. Work with your health care provider to find out whether it  is safe and beneficial for you to take aspirin daily. Ask your health care provider if you need to take a cholesterol-lowering medicine (statin). Find healthy ways to manage stress, such as: Meditation, yoga, or listening to music. Journaling. Talking to a trusted person. Spending time with friends and family. Minimize exposure to UV radiation to reduce your risk of skin cancer. Safety Always wear your seat belt while driving or riding in a vehicle. Do not drive: If you have been drinking alcohol. Do not ride with someone who has been drinking. When you are tired or distracted. While texting. If you have been using any mind-altering substances or drugs. Wear a helmet and other protective equipment during sports activities. If you have firearms in your house, make sure you follow all gun safety procedures. What's next? Visit your health care provider once a year for an annual wellness visit. Ask your health care provider how often you should have your eyes and teeth checked. Stay up to date on all vaccines. This information is not intended to replace advice given to you by your health care provider. Make sure you discuss any questions you have with your health care provider. Document Revised: 08/23/2020 Document Reviewed: 08/23/2020 Elsevier Patient Education  2023 Elsevier Inc.  

## 2022-04-25 NOTE — Assessment & Plan Note (Signed)
Encourage heart healthy diet such as MIND or DASH diet, increase exercise, avoid trans fats, simple carbohydrates and processed foods, consider a krill or fish or flaxseed oil cap daily.  °

## 2022-04-25 NOTE — Assessment & Plan Note (Signed)
Ghm utd Check labs

## 2022-07-05 ENCOUNTER — Telehealth: Payer: Self-pay | Admitting: Family Medicine

## 2022-07-05 NOTE — Telephone Encounter (Signed)
Copied from CRM [id number]. Topic: Medicare AWV >> Jul 05, 2022 10:14 AM Payton Doughty wrote: Reason for CRM: Called patient to schedule Medicare Annual Wellness Visit (AWV). Left message for patient to call back and schedule Medicare Annual Wellness Visit (AWV).  Last date of AWV: 04/24/21  Please schedule an appointment at any time with Donne Anon, CMA  .  If any questions, please contact me.  Thank you ,  Verlee Rossetti; Care Guide Ambulatory Clinical Support [place] [facility] Direct Dial: [phone]

## 2022-09-18 DIAGNOSIS — M545 Low back pain, unspecified: Secondary | ICD-10-CM | POA: Diagnosis not present

## 2022-09-18 DIAGNOSIS — M25552 Pain in left hip: Secondary | ICD-10-CM | POA: Diagnosis not present

## 2022-12-11 NOTE — Progress Notes (Signed)
Office Visit Note  Patient: Stephanie [name]             Date of Birth: Apr 08, 1950           MRN: [id number]             PCP: Donato Schultz, DO Referring: Donato Schultz, * Visit Date: 12/25/2022   Subjective:  Follow-up   History of Present Illness: Stephanie [name] is a 72 y.o. female here for follow up for seronegative inflammatory arthritis on HCQ 300 mg daily.  Her last was about 18 months ago she had also been taking prednisone 5 mg daily but tapered off of this completely has remained off and this year.  Has followed up with her PCP for continued hydroxychloroquine prescription as she was tolerating this fine.  Currently has issue with increase in shoulder pain and stiffness on both sides.  Has not seen any new visible joint effusions or discoloration.  Also with a new problem of abnormal sensation on the right lateral thigh sometimes cold or sometimes feels sensation of numbness or of wetness on the skin surface.  Previous HPI 05/02/2021 Stephanie [name] is a 72 y.o. female here for follow up for seronegative inflammatory arthritis on HCQ 300 mg daily and prednisone 5 mg daily. She has felt some gradual recovery of her shortness of breath over time. Pulmonology clinic testing all looked fine. She is taking the prednisone most of the time, sometimes skipping doses and tends to notice increased pain starting with her ankles or plantar fasciitis after 2 or 3 days. She has been working with OT for about a month, so far no huge improvements but is slightly increasing wrist ROM.   Previous HPI 01/03/21 Stephanie [name] is a 72 y.o. female here for follow up with seronegative inflammatory arthritis on HCQ 300 mg PO daily and tapering down prednisone from 10 mg daily to 5 mg daily. Since our visit a series of studies starting from cardiac coronary CT scan showing liver lesions and abdominal CT showing multiple liver nodules possibly hemangiomata but is scheduled for MRI 10/29. She has  experienced some abdominal and central facial swelling with the ongoing prednisone use. She continues to have shortness of breath without cause seen on chest imaging or heart imaging so far.    Previous HPI 07/11/20 Stephanie [name] is a 72 y.o. female here for evaluation of positive ANA with fatigue and joint pain symptoms. She has increased pain in her bilateral hands and wrists for about 6 months with no known preceding health events or changes. She hs a prior history of osteoarthritis chronically affecting her fingers especially base of the thumb but not this current problem. She has previously experienced carpal tunnel syndrome when working with sewing but does not think this is similar. She notices improvement with NSAIDs but she cannot tolerate taking high doses of these or multiple times per day due to abdominal and epigastric pain and diarrhea. She does not notice much swelling, warmth or discoloration of the affected areas. Workup in primary care office demonstrated a positive ANA and negative RF test. She denies noticing other new health problems such as hair loss, oral ulcers, lymphadenopathy, raynaud's phenomenon, rashes, or fevers.    Review of Systems  Constitutional:  Positive for fatigue.  HENT:  Positive for mouth dryness. Negative for mouth sores.   Eyes: Negative.  Negative for dryness.  Respiratory: Negative.  Negative for shortness of breath.  Cardiovascular: Negative.  Negative for chest pain and palpitations.  Gastrointestinal: Negative.  Negative for blood in stool, constipation and diarrhea.  Endocrine: Negative.  Negative for increased urination.  Genitourinary: Negative.  Negative for involuntary urination.  Musculoskeletal:  Positive for joint pain, joint pain, joint swelling, muscle weakness and morning stiffness. Negative for gait problem, myalgias and myalgias.  Skin:  Negative for color change, rash, hair loss and sensitivity to sunlight.  Allergic/Immunologic:  Negative.  Negative for susceptible to infections.  Neurological: Negative.  Negative for dizziness and headaches.  Hematological: Negative.  Negative for swollen glands.  Psychiatric/Behavioral: Negative.  Negative for depressed mood and sleep disturbance. The patient is not nervous/anxious.     PMFS History:  Patient Active Problem List   Diagnosis Date Noted   Preventative health care 04/25/2022   SOB (shortness of breath) 09/25/2020   Inflammatory arthritis 08/09/2020   Bilateral wrist pain 07/11/2020   Fatigue 06/02/2020   Dizziness 06/02/2020   Arthralgia 06/02/2020   Palpitations 06/02/2020   Basal cell carcinoma (BCC) of forehead 04/24/2020   Lactose intolerance    Snoring 05/16/2018   OSA (obstructive sleep apnea) 05/16/2018   RLS (restless legs syndrome) 01/26/2018   Insomnia 01/26/2018   Snores 01/26/2018   Hx of colonic polyps 08/20/2016   BACK PAIN 02/29/2008   Hyperlipidemia 04/23/2007   Osteoarthritis 04/23/2007   DISC DISEASE, LUMBAR 04/23/2007    Past Medical History:  Diagnosis Date   Arthritis    Cataract    bilateral sx   Complication of anesthesia    Per pt, hard to wake up past c-section/ 1 time.   DDD (degenerative disc disease)    in feet and hands   GERD (gastroesophageal reflux disease)    OTC PRN meds   Hx of colonic polyps 08/20/2016   Hyperlipidemia    on meds   Lactose intolerance    Osteopenia    Rheumatoid arthritis (HCC)    Seasonal allergies    Sleep apnea    does not use CPAP    Family History  Problem Relation Age of Onset   Hypertension Mother    Dementia Mother    Arthritis Mother    Alzheimer's disease Mother    Arthritis Father    Liver disease Father        NASH   Hypertension Father    Arthritis Sister    Liver disease Sister    Arthritis Brother    Liver disease Brother    Healthy Daughter    Healthy Son    Liver disease Paternal Aunt    Liver disease Paternal Aunt    Liver disease Paternal Aunt    Cystic  fibrosis Other        niece   Colon cancer Neg Hx    Colon polyps Neg Hx    Esophageal cancer Neg Hx    Rectal cancer Neg Hx    Stomach cancer Neg Hx    Past Surgical History:  Procedure Laterality Date   CATARACT EXTRACTION Bilateral 2021   Dr. Elmer Picker   CESAREAN SECTION     1980, 1982   COLONOSCOPY  2018   CG-MAC-Miralax (exc)-polyps   POLYPECTOMY  2018   polyps   TOTAL ABDOMINAL HYSTERECTOMY W/ BILATERAL SALPINGOOPHORECTOMY     Social History   Social History Narrative   Married, 1 son and 1 daughter   Self-employed   2 caffienated beverages/day   Immunization History  Administered Date(s) Administered   Fluad Quad(high Dose  65+) 03/29/2021   Influenza, High Dose Seasonal PF 01/23/2018   Influenza,inj,Quad PF,6+ Mos 12/08/2012   Influenza-Unspecified 12/31/2019   PFIZER(Purple Top)SARS-COV-2 Vaccination 04/16/2019, 05/12/2019, 12/31/2019   Pneumococcal Conjugate-13 05/06/2017   Pneumococcal Polysaccharide-23 01/04/2020   Td 06/28/2004   Tdap 05/12/2012   Zoster, Live 05/12/2012     Objective: Vital Signs: BP (!) 145/91 (BP Location: Left Arm, Patient Position: Sitting, Cuff Size: Large)   Pulse 75   Resp 16   Ht 5\' 3"  (1.6 m)   Wt 166 lb (75.3 kg)   BMI 29.41 kg/m    Physical Exam Eyes:     Conjunctiva/sclera: Conjunctivae normal.  Cardiovascular:     Rate and Rhythm: Normal rate and regular rhythm.  Pulmonary:     Effort: Pulmonary effort is normal.     Breath sounds: Normal breath sounds.  Musculoskeletal:     Right lower leg: No edema.     Left lower leg: No edema.  Lymphadenopathy:     Cervical: No cervical adenopathy.  Skin:    General: Skin is warm and dry.     Findings: No rash.  Neurological:     Mental Status: She is alert.     Comments: Shoulder abduction strength 4/5 possibly more limited with pain Hip flexion strength 4/5 Elbows, wrists, and ankles 5/5 in flexion and extension  Psychiatric:        Mood and Affect: Mood normal.       Musculoskeletal Exam:  Shoulders full ROM mild tenderness to pressure bilaterally, some pain with active abduction above horizontal Elbows full ROM no tenderness or swelling Wrists full ROM no tenderness or swelling Fingers full ROM mild chronic second third MCP joint widening with no palpable swelling and full flexion range of motion Knees full ROM no tenderness or swelling Ankles full ROM no tenderness or swelling  Investigation: No additional findings.  Imaging: XR Shoulder Left  Result Date: 12/27/2022 X-ray left shoulder 4 views Marginal glenohumeral joint osteophyte without significant loss of joint space.  Acromiohumeral distance is normal.  Normal internal and external rotated humerus position.  AC joint appears normal.  No visible calcification or joint effusion. Impression Mild appearing glenohumeral joint osteoarthritis  XR Shoulder Right  Result Date: 12/27/2022 X-ray of shoulder 4 views There is joint space narrowing and prominent marginal osteophytes at the glenohumeral joint.  Acromiohumeral distance appears normal.  Internal and external rotated humerus position normal.  Acromiohumeral distance normal.  AC joint appears normal.  No abnormal calcification or visible joint effusion. Impression Moderately severe glenohumeral joint osteoarthritis   Recent Labs: Lab Results  Component Value Date   WBC 5.1 04/25/2022   HGB 13.9 04/25/2022   PLT 224.0 04/25/2022   NA 138 04/25/2022   K 4.6 04/25/2022   CL 105 04/25/2022   CO2 25 04/25/2022   GLUCOSE 91 04/25/2022   BUN 15 04/25/2022   CREATININE 0.83 04/25/2022   BILITOT 0.7 04/25/2022   ALKPHOS 70 04/25/2022   AST 21 04/25/2022   ALT 18 04/25/2022   PROT 6.8 04/25/2022   ALBUMIN 4.3 04/25/2022   CALCIUM 9.4 04/25/2022    Speciality Comments: No specialty comments available.  Procedures:  No procedures performed Allergies: Penicillins   Assessment / Plan:     Visit Diagnoses: Inflammatory arthritis -  Plan: Sedimentation rate, C-reactive protein, CK, Anti-DNA antibody, double-stranded, ANA, C3 and C4  Concern for inflammatory arthritis could be provoked after stopping steroids but I do not see any peripheral joint inflammation on  exam.  Will recheck sed rate CRP double-stranded DNA and complements for evidence of active inflammation.  Strength seems very slightly decreased in the proximal distribution will check a CK level.  Alternatively could be related to underlying joint osteoarthritis and/or deconditioning.  If inflammation numbers are high but have low threshold to try resuming low-dose steroids or discussing change in DMARD treatment.  High risk medication use - HCQ 300 mg daily.  Has been tolerating hydroxychloroquine fine see interval blood count and metabolic panel were okay.  When you check on whether she is getting appropriate retinal toxicity screening continue medication.  Primary osteoarthritis, unspecified site Chronic pain of both shoulders - Plan: XR Shoulder Left, XR Shoulder Right  X-ray of bilateral shoulders to assist with increase in pain and some soreness stiffness with range of motion.  There is glenohumeral joint osteoarthritis more advanced on right shoulder than left.  No particular evidence for inflammatory joint changes.  Meralgia paresthetica  Discussed this cause for lateral thigh symptoms are generally benign and self-limited.  Orders: Orders Placed This Encounter  Procedures   XR Shoulder Left   XR Shoulder Right   Sedimentation rate   C-reactive protein   CK   Anti-DNA antibody, double-stranded   ANA   C3 and C4   Anti-nuclear ab-titer (ANA titer)   No orders of the defined types were placed in this encounter.    Follow-Up Instructions: Return in about 4 weeks (around 01/22/2023) for ?RA on HCQ f/u 87mo.   Fuller Plan, MD  Note - This record has been created using [employer].  Chart creation errors have been sought, but may not  always  have been located. Such creation errors do not reflect on  the standard of medical care.

## 2022-12-18 ENCOUNTER — Other Ambulatory Visit: Payer: Self-pay | Admitting: Family Medicine

## 2022-12-18 DIAGNOSIS — E785 Hyperlipidemia, unspecified: Secondary | ICD-10-CM

## 2022-12-25 ENCOUNTER — Encounter: Payer: Self-pay | Admitting: Internal Medicine

## 2022-12-25 ENCOUNTER — Ambulatory Visit: Payer: PPO

## 2022-12-25 ENCOUNTER — Ambulatory Visit: Payer: PPO | Attending: Internal Medicine | Admitting: Internal Medicine

## 2022-12-25 VITALS — BP 145/91 | HR 75 | Resp 16 | Ht 63.0 in | Wt 166.0 lb

## 2022-12-25 DIAGNOSIS — M25511 Pain in right shoulder: Secondary | ICD-10-CM

## 2022-12-25 DIAGNOSIS — M1990 Unspecified osteoarthritis, unspecified site: Secondary | ICD-10-CM | POA: Diagnosis not present

## 2022-12-25 DIAGNOSIS — Z7952 Long term (current) use of systemic steroids: Secondary | ICD-10-CM

## 2022-12-25 DIAGNOSIS — M25512 Pain in left shoulder: Secondary | ICD-10-CM

## 2022-12-25 DIAGNOSIS — Z79899 Other long term (current) drug therapy: Secondary | ICD-10-CM | POA: Diagnosis not present

## 2022-12-25 DIAGNOSIS — M2550 Pain in unspecified joint: Secondary | ICD-10-CM

## 2022-12-25 DIAGNOSIS — M1991 Primary osteoarthritis, unspecified site: Secondary | ICD-10-CM | POA: Diagnosis not present

## 2022-12-25 DIAGNOSIS — G8929 Other chronic pain: Secondary | ICD-10-CM

## 2022-12-25 NOTE — Patient Instructions (Signed)
Your right thigh numbness is due to meralgia paresthetica.  I am checking for evidence of systemic inflammation or muscle damage or turnover. I am also repeating several of your antibody tests that were low positive result before.  I am checking shoulder xrays for evidence of osteoarthritis or any inflammatory changes.

## 2022-12-27 LAB — SEDIMENTATION RATE: Sed Rate: 6 mm/h (ref 0–30)

## 2022-12-27 LAB — ANTI-NUCLEAR AB-TITER (ANA TITER): ANA Titer 1: 1:80 {titer} — ABNORMAL HIGH

## 2022-12-27 LAB — ANTI-DNA ANTIBODY, DOUBLE-STRANDED: ds DNA Ab: 3 [IU]/mL

## 2022-12-27 LAB — C-REACTIVE PROTEIN: CRP: <3 mg/L (ref <8.0)

## 2022-12-27 LAB — CK: Total CK: 117 U/L (ref 29–143)

## 2022-12-27 LAB — C3 AND C4
C3 Complement: 143 mg/dL (ref 83–193)
C4 Complement: 29 mg/dL (ref 15–57)

## 2022-12-27 LAB — ANA: Anti Nuclear Antibody (ANA): POSITIVE — AB

## 2023-01-20 IMAGING — CT CT CHEST-ABD-PELV W/ CM
2 of 11 series · 11 of 46 positions shown, 15 images · IV contrast (OMNIPAQUE 300)
Comparison: CT chest coronary angiogram, 12/21/2020

CLINICAL DATA: Suspicious hepatic lesions incidentally identified
by prior CT chest coronary angiogram

EXAM:
CT CHEST, ABDOMEN, AND PELVIS WITH CONTRAST
TECHNIQUE: Multidetector CT imaging of the chest, abdomen and pelvis was
performed following the standard protocol during bolus
administration of intravenous contrast.
CONTRAST:  100mL OMNIPAQUE IOHEXOL 350 MG/ML SOLN, additional oral
enteric contrast

[Series 5: venous phase 3.0 br38 · axial · portal-venous · 0.71mm/px · z∈[-566,-78]mm · 9 of 197 slices shown, 13 images]
[im 17/197  soft-tissue]
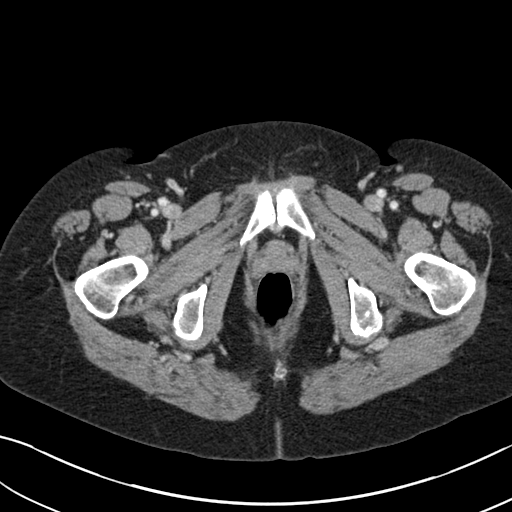
[im 17/197  bone]
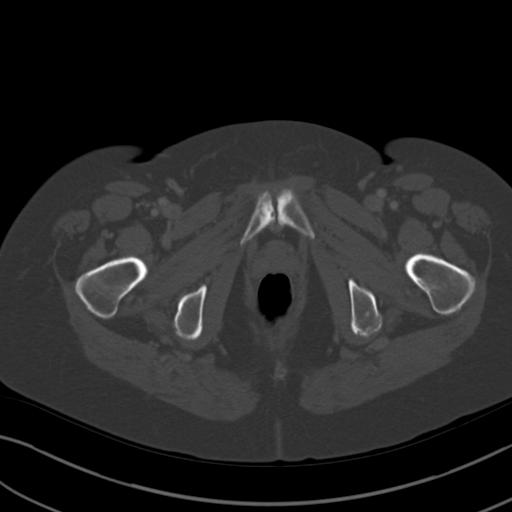
[im 50/197  soft-tissue]
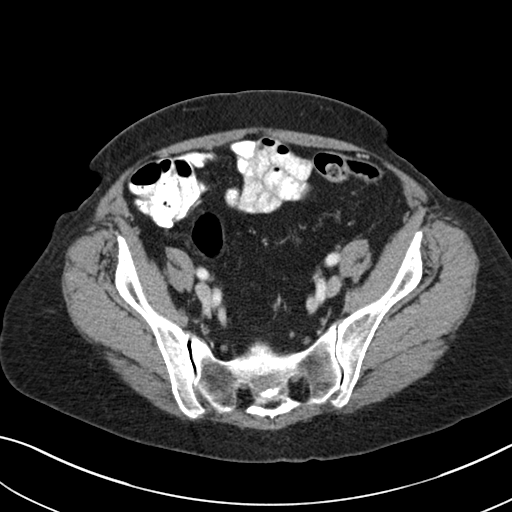
[im 66/197  soft-tissue]
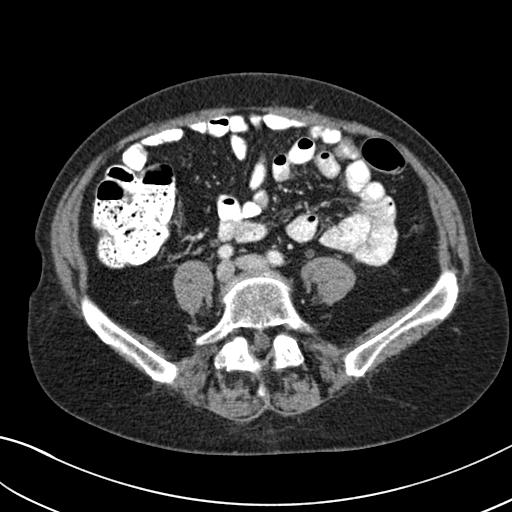
[im 82/197  soft-tissue]
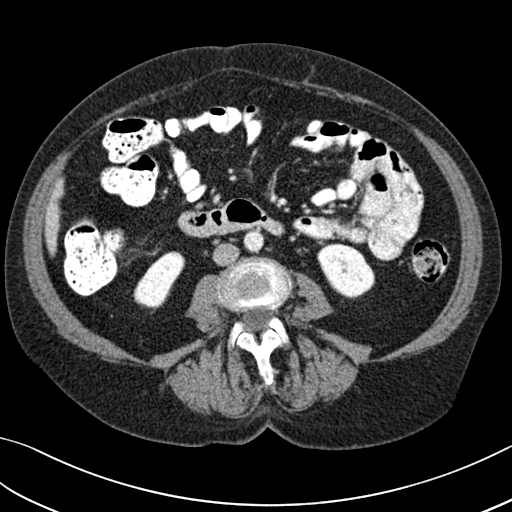
[im 115/197  soft-tissue]
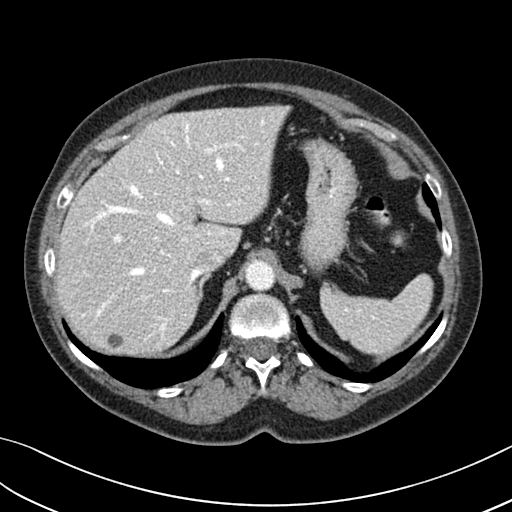
[im 131/197  soft-tissue]
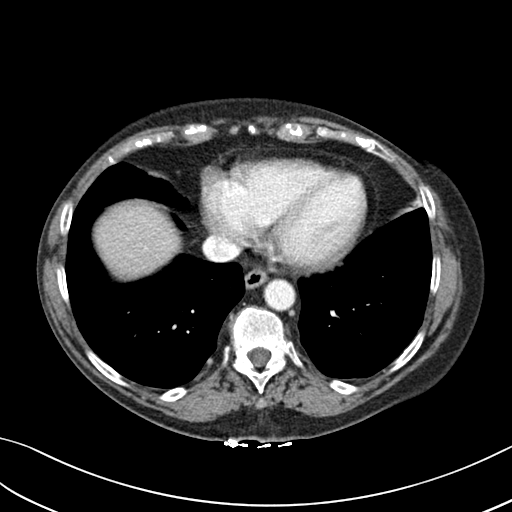
[im 131/197  lung]
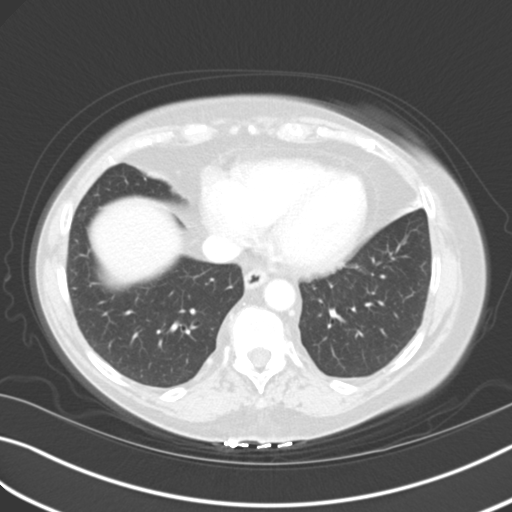
[im 148/197  soft-tissue]
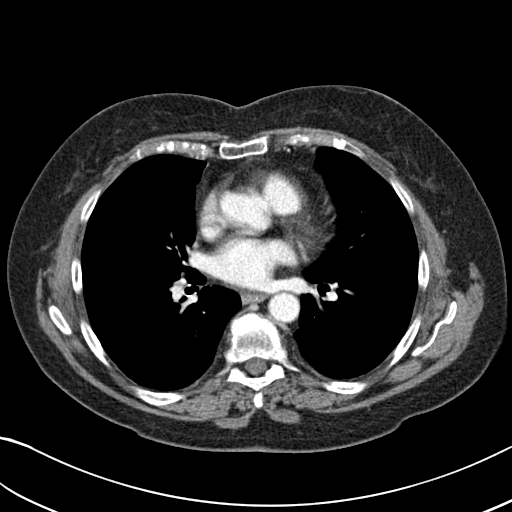
[im 148/197  lung]
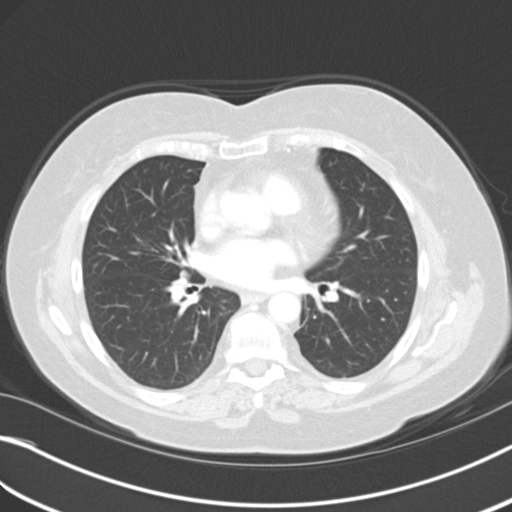
[im 164/197  lung]
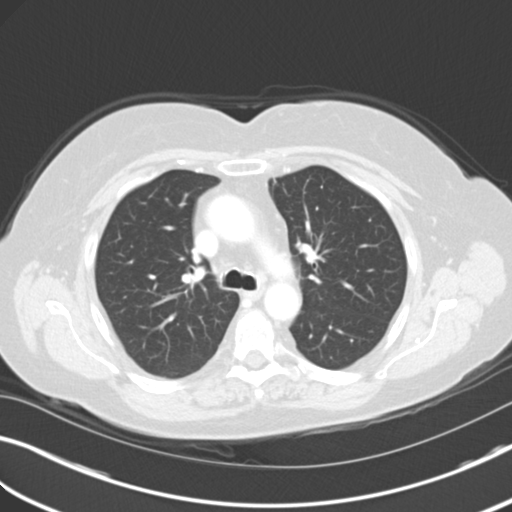
[im 180/197  soft-tissue]
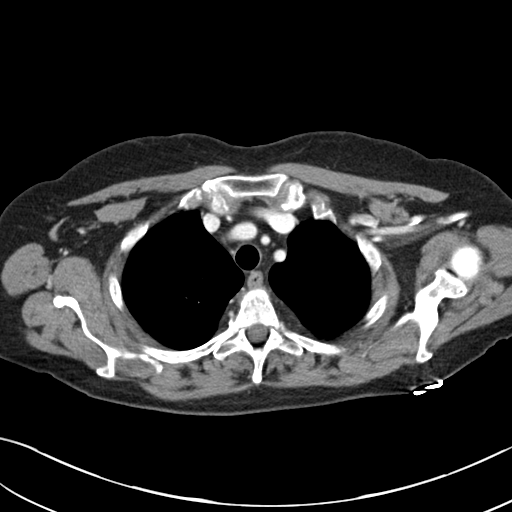
[im 180/197  lung]
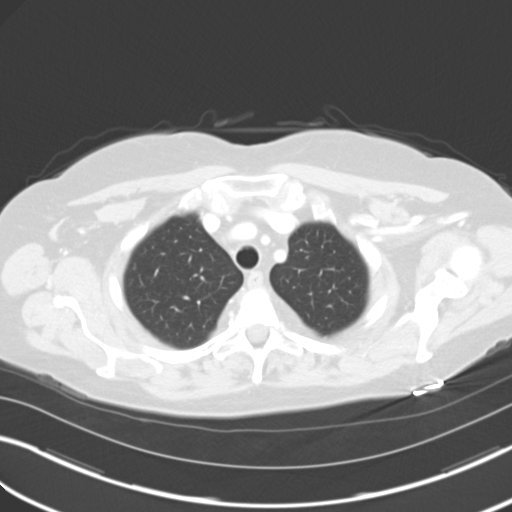

[Series 11: venous coronal · coronal · portal-venous · 0.74mm/px · 2 of 133 slices shown]
[im 45/133  soft-tissue]
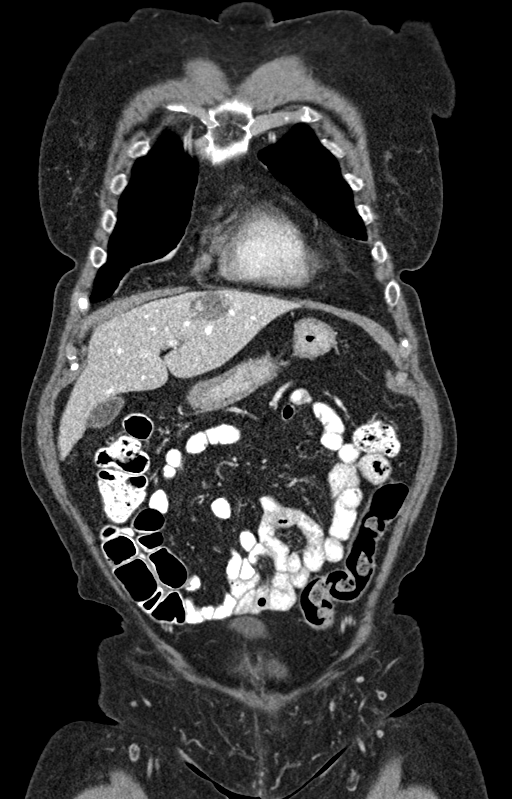
[im 89/133  soft-tissue]
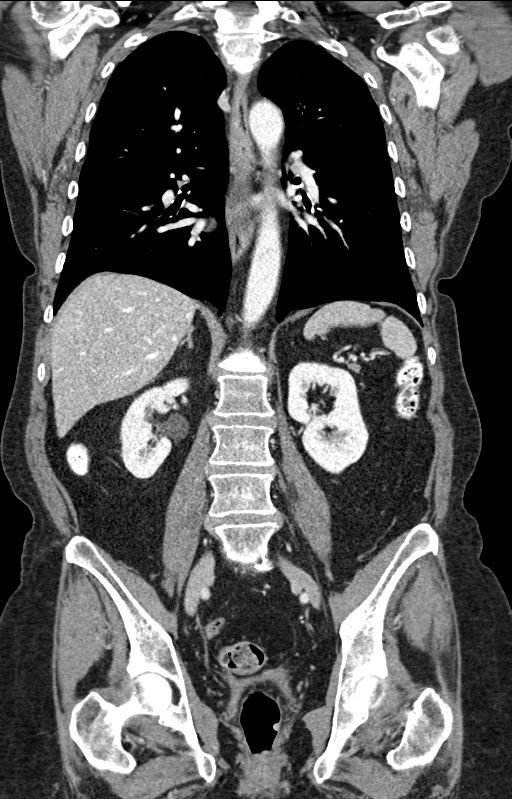

[11 of 46 positions shown; findings below may reference images not displayed]

FINDINGS: CT CHEST FINDINGS

Cardiovascular: No significant vascular findings. Normal heart size.
No pericardial effusion.

Mediastinum/Nodes: No enlarged mediastinal, hilar, or axillary lymph
nodes. Thyroid gland, trachea, and esophagus demonstrate no
significant findings.

Lungs/Pleura: There is a 0.6 cm subpleural nodule of the left lung
base (series 6, image 74). No pleural effusion or pneumothorax.

Musculoskeletal: No chest wall mass or suspicious bone lesions
identified.

CT ABDOMEN PELVIS FINDINGS

Hepatobiliary: There are intermediate attenuation, rim hypoenhancing
lesions of the left lobe of the liver, measuring 3.1 x 2.4 cm
(series 5, image 77) and 3.1 x 2.5 cm (series 5, image 74). These
demonstrate some heterogeneous peripheral and internal contrast
enhancement on delayed phase imaging (series 8, image 9). There are
multiple additional smaller lesions scattered throughout the liver,
which are too small to confidently characterize but generally fluid
attenuation and likely small cysts or hemangiomata (e.g. Series 5,
image 83, 76). No gallstones, gallbladder wall thickening, or
biliary dilatation.

Pancreas: Unremarkable. No pancreatic ductal dilatation or
surrounding inflammatory changes.

Spleen: Normal in size without significant abnormality.

Adrenals/Urinary Tract: Adrenal glands are unremarkable. Kidneys are
normal, without renal calculi, solid lesion, or hydronephrosis.
Bladder is unremarkable.

Stomach/Bowel: Stomach is within normal limits. Appendix appears
normal. No evidence of bowel wall thickening, distention, or
inflammatory changes.

Vascular/Lymphatic: No significant vascular findings are present. No
enlarged abdominal or pelvic lymph nodes.

Reproductive: Status post hysterectomy.

Other: No abdominal wall hernia or abnormality. No abdominopelvic
ascites.

Musculoskeletal: No acute or significant osseous findings.
IMPRESSION: 1. There are intermediate attenuation, rim hypoenhancing lesions of
the left lobe of the liver, measuring 3.1 x 2.4 cm and 3.1 x 2.5 cm.
These demonstrate some heterogeneous peripheral and internal
contrast enhancement on delayed phase imaging. Although some imaging
features suggest unusual hemangiomata, these lesions are not
definitively benign and remain worrisome for metastases. These could
be more confidently characterized by multiphasic contrast enhanced
MR, or assessed for metabolic activity on FDG PET-CT.
2. There are multiple additional smaller lesions scattered
throughout the liver, which are too small to confidently
characterize but generally fluid attenuation and likely small cysts
or hemangiomata. As above, these could be definitively characterized
by MR.
3. There is a 0.6 cm subpleural nodule of the left lung base. This
is statistically likely to be benign and incidental. Attention on
follow-up. If managed by [HOSPITAL] criteria for incidental
pulmonary nodules (i.e. liver lesions are proven benign by further
imaging) Non-contrast chest CT at 6-12 months is recommended. If the
nodule is stable at time of repeat CT, then future CT at 18-24
months (from today's scan) is considered optional for low-risk
patients, but is recommended for high-risk patients. This
recommendation follows the consensus statement: Guidelines for
Management of Incidental Pulmonary Nodules Detected on CT Images:
4. No evidence of primary malignancy, or other evidence of
metastatic disease in the chest, abdomen, or pelvis.
5. Status post hysterectomy.

## 2023-01-21 ENCOUNTER — Encounter: Payer: Self-pay | Admitting: Family Medicine

## 2023-01-21 ENCOUNTER — Ambulatory Visit (INDEPENDENT_AMBULATORY_CARE_PROVIDER_SITE_OTHER): Payer: PPO | Admitting: Family Medicine

## 2023-01-21 VITALS — BP 140/80 | HR 79 | Temp 98.1°F | Resp 18 | Ht 63.0 in | Wt 163.0 lb

## 2023-01-21 DIAGNOSIS — Z23 Encounter for immunization: Secondary | ICD-10-CM | POA: Diagnosis not present

## 2023-01-21 DIAGNOSIS — R030 Elevated blood-pressure reading, without diagnosis of hypertension: Secondary | ICD-10-CM | POA: Diagnosis not present

## 2023-01-21 DIAGNOSIS — E785 Hyperlipidemia, unspecified: Secondary | ICD-10-CM | POA: Diagnosis not present

## 2023-01-21 DIAGNOSIS — M1990 Unspecified osteoarthritis, unspecified site: Secondary | ICD-10-CM | POA: Diagnosis not present

## 2023-01-21 DIAGNOSIS — G2581 Restless legs syndrome: Secondary | ICD-10-CM

## 2023-01-21 LAB — COMPREHENSIVE METABOLIC PANEL
ALT: 15 U/L (ref 0–35)
AST: 18 U/L (ref 0–37)
Albumin: 4.2 g/dL (ref 3.5–5.2)
Alkaline Phosphatase: 75 U/L (ref 39–117)
BUN: 18 mg/dL (ref 6–23)
CO2: 25 meq/L (ref 19–32)
Calcium: 9 mg/dL (ref 8.4–10.5)
Chloride: 106 meq/L (ref 96–112)
Creatinine, Ser: 0.76 mg/dL (ref 0.40–1.20)
GFR: 79.15 mL/min (ref >60.00)
Glucose, Bld: 87 mg/dL (ref 70–99)
Potassium: 4.4 meq/L (ref 3.5–5.1)
Sodium: 141 meq/L (ref 135–145)
Total Bilirubin: 0.6 mg/dL (ref 0.2–1.2)
Total Protein: 6.5 g/dL (ref 6.0–8.3)

## 2023-01-21 LAB — LIPID PANEL
Cholesterol: 183 mg/dL (ref 0–200)
HDL: 75 mg/dL (ref >39.00)
LDL Cholesterol: 84 mg/dL (ref 0–99)
NonHDL: 107.93
Total CHOL/HDL Ratio: 2
Triglycerides: 119 mg/dL (ref 0.0–149.0)
VLDL: 23.8 mg/dL (ref 0.0–40.0)

## 2023-01-21 LAB — CBC WITH DIFFERENTIAL/PLATELET
Basophils Absolute: 0 10*3/uL (ref 0.0–0.1)
Basophils Relative: 0.8 % (ref 0.0–3.0)
Eosinophils Absolute: 0.2 10*3/uL (ref 0.0–0.7)
Eosinophils Relative: 2.8 % (ref 0.0–5.0)
HCT: 41.5 % (ref 36.0–46.0)
Hemoglobin: 13.8 g/dL (ref 12.0–15.0)
Lymphocytes Relative: 26.9 % (ref 12.0–46.0)
Lymphs Abs: 1.5 10*3/uL (ref 0.7–4.0)
MCHC: 33.3 g/dL (ref 30.0–36.0)
MCV: 95.1 fL (ref 78.0–100.0)
Monocytes Absolute: 0.6 10*3/uL (ref 0.1–1.0)
Monocytes Relative: 9.8 % (ref 3.0–12.0)
Neutro Abs: 3.4 10*3/uL (ref 1.4–7.7)
Neutrophils Relative %: 59.7 % (ref 43.0–77.0)
Platelets: 220 10*3/uL (ref 150.0–400.0)
RBC: 4.36 Mil/uL (ref 3.87–5.11)
RDW: 13.2 % (ref 11.5–15.5)
WBC: 5.6 10*3/uL (ref 4.0–10.5)

## 2023-01-21 LAB — TSH: TSH: 2.09 u[IU]/mL (ref 0.35–5.50)

## 2023-01-21 MED ORDER — TRAMADOL HCL 50 MG PO TABS: 50.0000 mg | ORAL_TABLET | Freq: Three times a day (TID) | ORAL | 0 refills | Status: AC | PRN

## 2023-01-21 NOTE — Assessment & Plan Note (Signed)
Ok to day Well controlled, no changes to meds. Encouraged heart healthy diet such as the DASH diet and exercise as tolerated.   F/u for cpe or sooner as needed

## 2023-01-21 NOTE — Assessment & Plan Note (Signed)
Tolerating statin, encouraged heart healthy diet, avoid trans fats, minimize simple carbs and saturated fats. Increase exercise as tolerated 

## 2023-01-21 NOTE — Progress Notes (Signed)
Established Patient Office Visit  Subjective   Patient ID: Stephanie [name], female    DOB: [birth year]  Age: 72 y.o. MRN: [id number]  Chief Complaint  Patient presents with   Hypertension   Follow-up    HPI Discussed the use of AI scribe software for clinical note transcription with the patient, who gave verbal consent to proceed.  History of Present Illness   The patient, with a history of hypertension, presented with concerns about elevated blood pressure readings at home, which were higher than her usual baseline. She reported a recent episode of feeling unwell, characterized by a mild headache located at the back of the head, lack of energy, and fatigue. These symptoms were unusual for the patient, who typically maintains a high energy level. There were no associated symptoms of chest pain, shortness of breath, or palpitations.  The patient also reported ongoing joint pain, particularly in the hands and shoulders. The shoulder pain was a new symptom, while the hand pain was a chronic issue. The patient had been managing the pain with naproxen sodium and heat application, but found these measures to be insufficient. She had previously tried Celebrex, but it caused significant gastric distress. The patient had also been using Requip for restless legs syndrome, but noted that it took two hours to take effect.  The patient's pain was severe enough to disrupt her sleep, which she believed might be contributing to her elevated blood pressure. She expressed a willingness to try new medications to manage her pain and improve her sleep quality. The patient was also dealing with an elevated ANA level, which had improved compared to two years ago.      Patient Active Problem List   Diagnosis Date Noted   Elevated BP without diagnosis of hypertension 01/21/2023   Preventative health care 04/25/2022   SOB (shortness of breath) 09/25/2020   Inflammatory arthritis 08/09/2020   Bilateral wrist pain  07/11/2020   Fatigue 06/02/2020   Dizziness 06/02/2020   Arthralgia 06/02/2020   Palpitations 06/02/2020   Basal cell carcinoma (BCC) of forehead 04/24/2020   Lactose intolerance    Snoring 05/16/2018   OSA (obstructive sleep apnea) 05/16/2018   RLS (restless legs syndrome) 01/26/2018   Insomnia 01/26/2018   Snores 01/26/2018   Hx of colonic polyps 08/20/2016   BACK PAIN 02/29/2008   Hyperlipidemia 04/23/2007   Osteoarthritis 04/23/2007   DISC DISEASE, LUMBAR 04/23/2007   Past Medical History:  Diagnosis Date   Arthritis    Cataract    bilateral sx   Complication of anesthesia    Per pt, hard to wake up past c-section/ 1 time.   DDD (degenerative disc disease)    in feet and hands   GERD (gastroesophageal reflux disease)    OTC PRN meds   Hx of colonic polyps 08/20/2016   Hyperlipidemia    on meds   Lactose intolerance    Osteopenia    Rheumatoid arthritis (HCC)    Seasonal allergies    Sleep apnea    does not use CPAP   Past Surgical History:  Procedure Laterality Date   CATARACT EXTRACTION Bilateral 2021   Dr. Ericka Pontiff SECTION     1980, 1982   COLONOSCOPY  2018   CG-MAC-Miralax (exc)-polyps   POLYPECTOMY  2018   polyps   TOTAL ABDOMINAL HYSTERECTOMY W/ BILATERAL SALPINGOOPHORECTOMY     Social History   Tobacco Use   Smoking status: Never    Passive exposure: Never  Smokeless tobacco: Never  Vaping Use   Vaping status: Never Used  Substance Use Topics   Alcohol use: Yes    Alcohol/week: 7.0 standard drinks of alcohol    Types: 7 Standard drinks or equivalent per week   Drug use: No   Social History   Socioeconomic History   Marital status: Married    Spouse name: Not on file   Number of children: 2   Years of education: Not on file   Highest education level: Not on file  Occupational History   Occupation: self  Tobacco Use   Smoking status: Never    Passive exposure: Never   Smokeless tobacco: Never  Vaping Use   Vaping  status: Never Used  Substance and Sexual Activity   Alcohol use: Yes    Alcohol/week: 7.0 standard drinks of alcohol    Types: 7 Standard drinks or equivalent per week   Drug use: No   Sexual activity: Yes    Partners: Male  Other Topics Concern   Not on file  Social History Narrative   Married, 1 son and 1 daughter   Self-employed   2 caffienated beverages/day   Social Determinants of Health   Financial Resource Strain: Low Risk  (04/24/2021)   Overall Financial Resource Strain (CARDIA)    Difficulty of Paying Living Expenses: Not hard at all  Food Insecurity: No Food Insecurity (04/24/2021)   Hunger Vital Sign    Worried About Running Out of Food in the Last Year: Never true    Ran Out of Food in the Last Year: Never true  Transportation Needs: No Transportation Needs (04/24/2021)   PRAPARE - Administrator, Civil Service (Medical): No    Lack of Transportation (Non-Medical): No  Physical Activity: Inactive (04/24/2021)   Exercise Vital Sign    Days of Exercise per Week: 0 days    Minutes of Exercise per Session: 0 min  Stress: No Stress Concern Present (04/24/2021)   [employer] of Occupational Health - Occupational Stress Questionnaire    Feeling of Stress : Not at all  Social Connections: Socially Integrated (04/24/2021)   Social Connection and Isolation Panel [NHANES]    Frequency of Communication with Friends and Family: Twice a week    Frequency of Social Gatherings with Friends and Family: More than three times a week    Attends Religious Services: 1 to 4 times per year    Active Member of [employer] or Organizations: Yes    Attends Banker Meetings: More than 4 times per year    Marital Status: Married  Catering manager Violence: Not At Risk (04/24/2021)   Humiliation, Afraid, Rape, and Kick questionnaire    Fear of Current or Ex-Partner: No    Emotionally Abused: No    Physically Abused: No    Sexually Abused: No   Family Status   Relation Name Status   Mother  Deceased   Father  Deceased   Sister  Alive   Brother  Alive   Brother  Alive   Daughter  Alive   Son  Alive   Pat Aunt  (Not Specified)   Emelda Brothers  (Not Specified)   Emelda Brothers  (Not Specified)   Other  (Not Specified)   Neg Hx  (Not Specified)  No partnership data on file   Family History  Problem Relation Age of Onset   Hypertension Mother    Dementia Mother    Arthritis Mother    Alzheimer's  disease Mother    Arthritis Father    Liver disease Father        NASH   Hypertension Father    Arthritis Sister    Liver disease Sister    Arthritis Brother    Liver disease Brother    Healthy Daughter    Healthy Son    Liver disease Paternal Aunt    Liver disease Paternal Aunt    Liver disease Paternal Aunt    Cystic fibrosis Other        niece   Colon cancer Neg Hx    Colon polyps Neg Hx    Esophageal cancer Neg Hx    Rectal cancer Neg Hx    Stomach cancer Neg Hx    Allergies  Allergen Reactions   Penicillins Anaphylaxis   Neosporin [Bacitracin-Polymyxin B] Dermatitis      Review of Systems  Constitutional:  Positive for malaise/fatigue. Negative for chills and fever.  HENT:  Negative for congestion and hearing loss.   Eyes:  Negative for blurred vision and discharge.  Respiratory:  Negative for cough, sputum production and shortness of breath.   Cardiovascular:  Negative for chest pain, palpitations and leg swelling.  Gastrointestinal:  Negative for abdominal pain, blood in stool, constipation, diarrhea, heartburn, nausea and vomiting.  Genitourinary:  Negative for dysuria, frequency, hematuria and urgency.  Musculoskeletal:  Positive for joint pain. Negative for back pain, falls and myalgias.  Skin:  Negative for rash.  Neurological:  Positive for headaches. Negative for dizziness, sensory change, loss of consciousness and weakness.  Endo/Heme/Allergies:  Negative for environmental allergies. Does not bruise/bleed easily.   Psychiatric/Behavioral:  Negative for depression and suicidal ideas. The patient is not nervous/anxious and does not have insomnia.       Objective:     BP (!) 140/80 (BP Location: Left Arm, Patient Position: Sitting, Cuff Size: Normal)   Pulse 79   Temp 98.1 F (36.7 C) (Oral)   Resp 18   Ht 5\' 3"  (1.6 m)   Wt 163 lb (73.9 kg)   SpO2 100%   BMI 28.87 kg/m  BP Readings from Last 3 Encounters:  01/21/23 (!) 140/80  12/25/22 (!) 145/91  04/25/22 124/80   Wt Readings from Last 3 Encounters:  01/21/23 163 lb (73.9 kg)  12/25/22 166 lb (75.3 kg)  04/25/22 169 lb 9.6 oz (76.9 kg)   SpO2 Readings from Last 3 Encounters:  01/21/23 100%  04/25/22 97%  10/08/21 95%      Physical Exam Vitals and nursing note reviewed.  Constitutional:      General: She is not in acute distress.    Appearance: Normal appearance. She is well-developed.  HENT:     Head: Normocephalic and atraumatic.  Eyes:     General: No scleral icterus.       Right eye: No discharge.        Left eye: No discharge.  Cardiovascular:     Rate and Rhythm: Normal rate and regular rhythm.     Heart sounds: No murmur heard. Pulmonary:     Effort: Pulmonary effort is normal. No respiratory distress.     Breath sounds: Normal breath sounds.  Musculoskeletal:        General: Tenderness present. Normal range of motion.     Right shoulder: Tenderness present.     Left shoulder: Tenderness present.     Right hand: Deformity and bony tenderness present.     Left hand: Deformity and bony tenderness present.  Cervical back: Normal range of motion and neck supple.     Right lower leg: No edema.     Left lower leg: No edema.  Skin:    General: Skin is warm and dry.  Neurological:     Mental Status: She is alert and oriented to person, place, and time.  Psychiatric:        Mood and Affect: Mood normal.        Behavior: Behavior normal.        Thought Content: Thought content normal.        Judgment: Judgment  normal.      No results found for any visits on 01/21/23.  Last CBC Lab Results  Component Value Date   WBC 5.1 04/25/2022   HGB 13.9 04/25/2022   HCT 42.2 04/25/2022   MCV 93.0 04/25/2022   MCH 30.6 01/04/2020   RDW 12.6 04/25/2022   PLT 224.0 04/25/2022   Last metabolic panel Lab Results  Component Value Date   GLUCOSE 91 04/25/2022   NA 138 04/25/2022   K 4.6 04/25/2022   CL 105 04/25/2022   CO2 25 04/25/2022   BUN 15 04/25/2022   CREATININE 0.83 04/25/2022   GFR 71.58 04/25/2022   CALCIUM 9.4 04/25/2022   PROT 6.8 04/25/2022   ALBUMIN 4.3 04/25/2022   BILITOT 0.7 04/25/2022   ALKPHOS 70 04/25/2022   AST 21 04/25/2022   ALT 18 04/25/2022   Last lipids Lab Results  Component Value Date   CHOL 193 04/25/2022   HDL 80.40 04/25/2022   LDLCALC 99 04/25/2022   LDLDIRECT 103.2 10/15/2011   TRIG 67.0 04/25/2022   CHOLHDL 2 04/25/2022   Last hemoglobin A1c No results found for: "HGBA1C" Last thyroid functions Lab Results  Component Value Date   TSH 2.42 06/02/2020   Last vitamin D Lab Results  Component Value Date   VD25OH 34.79 09/25/2020   Last vitamin B12 and Folate Lab Results  Component Value Date   VITAMINB12 918 (H) 09/25/2020      The 10-year ASCVD risk score (Arnett DK, et al., 2019) is: 10.3%    Assessment & Plan:   Problem List Items Addressed This Visit       Unprioritized   RLS (restless legs syndrome)    Con't requip      Inflammatory arthritis    Per rheum      Relevant Medications   traMADol (ULTRAM) 50 MG tablet   Hyperlipidemia - Primary (Chronic)    Tolerating statin, encouraged heart healthy diet, avoid trans fats, minimize simple carbs and saturated fats. Increase exercise as tolerated       Relevant Orders   Comprehensive metabolic panel   Lipid panel   TSH   Elevated BP without diagnosis of hypertension    Ok to day Well controlled, no changes to meds. Encouraged heart healthy diet such as the DASH diet  and exercise as tolerated.   F/u for cpe or sooner as needed       Relevant Orders   CBC with Differential/Platelet   Comprehensive metabolic panel   Lipid panel   TSH   Other Visit Diagnoses     Need for influenza vaccination       Relevant Orders   Flu Vaccine Trivalent High Dose (Fluad)     Assessment and Plan    Rheumatoid Arthritis   She reports chronic joint pain, especially in the hands and shoulders, with an increase in shoulder pain and persistent hand pain.  She is currently on hydroxychloroquine, having previously been on prednisone, and has expressed a preference for effective treatment over cost, showing willingness to pay for newer medications if necessary. We discussed the potential for new biologic medications and the risks and benefits compared to her current treatment. We will continue hydroxychloroquine and discuss new biologic medications with a rheumatologist. We will also consider paraffin wax treatment and heated gloves for hand pain, and she can use Voltaren gel and Salonpas patches as needed.  Chronic Pain   She experiences chronic pain in her hands and shoulders, worsened by activities like woodworking and gardening, which impacts her sleep quality. She occasionally uses naproxen sodium, which causes less gastric distress than other NSAIDs, and cannot tolerate Celebrex. We discussed tramadol as a nighttime pain relief option, including its status as a controlled substance and the need for a urine drug screen. We will prescribe tramadol 15 tablets for nighttime use, perform a urine drug screen, and monitor pain relief and side effects.  Hypertension   She has elevated blood pressure readings at home and in-office, along with mild headache and fatigue but no chest pain, shortness of breath, or palpitations. Possible contributing factors include pain, poor sleep, and dietary salt intake. We discussed the importance of monitoring blood pressure, reducing salt intake,  and explained how pain and poor sleep can elevate blood pressure. We will monitor blood pressure at home, reduce dietary salt intake, and recheck blood pressure in 3-4 months.  Restless Legs Syndrome   She takes immediate-release Requip but experiences delayed onset of action, taking two doses at 6:30 PM and two more at bedtime. We discussed the timing of medication and the importance of adherence to the schedule. She will continue the current Requip regimen and set reminders to take medication on time.  General Health Maintenance   She is up to date on flu shots but has not received COVID-19 vaccinations. Shingles and Tdap vaccines are due, while mammogram and colonoscopy are up to date. We will administer the flu shot today and recommend shingles and Tdap vaccines at the pharmacy.  Follow-up   We will schedule a physical in 3-4 months to follow up on blood pressure and pain management.        Return in about 3 months (around 04/23/2023), or if symptoms worsen or fail to improve, for annual exam, fasting.    Donato Schultz, DO

## 2023-01-21 NOTE — Assessment & Plan Note (Signed)
Con't requip  

## 2023-01-21 NOTE — Assessment & Plan Note (Signed)
Per rheum 

## 2023-01-21 NOTE — Patient Instructions (Signed)

## 2023-01-29 NOTE — Progress Notes (Unsigned)
Office Visit Note  Patient: Stephanie [name]             Date of Birth: 03/26/1950           MRN: [id number]             PCP: Donato Schultz, DO Referring: Donato Schultz, * Visit Date: 02/11/2023   Subjective:  Follow-up (Patient states yesterday she was at the orthopedic doctor and has some xrays taken. Patient states she brought a CD. )   History of Present Illness: Stephanie [name] is a 72 y.o. female here for follow up for seronegative inflammatory arthritis on HCQ 300 mg daily.  Overall feels her joint pain is not very well-controlled daily symptoms of bilateral hand pain.  Also currently has increased left sided pain after she fell onto that side while decorating for Christmas.  Had pain at the shoulder and along chest wall this was evaluated and had x-rays not demonstrating any fracture.  She has been taking naproxen as needed but this only partially controls her symptoms and gets upset stomach if taking it at a consistent twice daily dosing.  Previous HPI 12/25/2022 Stephanie [name] is a 72 y.o. female here for follow up for seronegative inflammatory arthritis on HCQ 300 mg daily.  Her last was about 18 months ago she had also been taking prednisone 5 mg daily but tapered off of this completely has remained off and this year.  Has followed up with her PCP for continued hydroxychloroquine prescription as she was tolerating this fine.  Currently has issue with increase in shoulder pain and stiffness on both sides.  Has not seen any new visible joint effusions or discoloration.  Also with a new problem of abnormal sensation on the right lateral thigh sometimes cold or sometimes feels sensation of numbness or of wetness on the skin surface.   Previous HPI 05/02/2021 Stephanie [name] is a 72 y.o. female here for follow up for seronegative inflammatory arthritis on HCQ 300 mg daily and prednisone 5 mg daily. She has felt some gradual recovery of her shortness of breath over time.  Pulmonology clinic testing all looked fine. She is taking the prednisone most of the time, sometimes skipping doses and tends to notice increased pain starting with her ankles or plantar fasciitis after 2 or 3 days. She has been working with OT for about a month, so far no huge improvements but is slightly increasing wrist ROM.   Previous HPI 01/03/21 Stephanie [name] is a 72 y.o. female here for follow up with seronegative inflammatory arthritis on HCQ 300 mg PO daily and tapering down prednisone from 10 mg daily to 5 mg daily. Since our visit a series of studies starting from cardiac coronary CT scan showing liver lesions and abdominal CT showing multiple liver nodules possibly hemangiomata but is scheduled for MRI 10/29. She has experienced some abdominal and central facial swelling with the ongoing prednisone use. She continues to have shortness of breath without cause seen on chest imaging or heart imaging so far.    Previous HPI 07/11/20 Stephanie [name] is a 72 y.o. female here for evaluation of positive ANA with fatigue and joint pain symptoms. She has increased pain in her bilateral hands and wrists for about 6 months with no known preceding health events or changes. She hs a prior history of osteoarthritis chronically affecting her fingers especially base of the thumb but not this current problem. She has previously  experienced carpal tunnel syndrome when working with sewing but does not think this is similar. She notices improvement with NSAIDs but she cannot tolerate taking high doses of these or multiple times per day due to abdominal and epigastric pain and diarrhea. She does not notice much swelling, warmth or discoloration of the affected areas. Workup in primary care office demonstrated a positive ANA and negative RF test. She denies noticing other new health problems such as hair loss, oral ulcers, lymphadenopathy, raynaud's phenomenon, rashes, or fevers.   Review of Systems  Constitutional:   Positive for fatigue.  HENT:  Positive for mouth dryness. Negative for mouth sores.   Eyes:  Negative for dryness.  Respiratory:  Negative for shortness of breath.   Cardiovascular:  Negative for chest pain and palpitations.  Gastrointestinal:  Negative for blood in stool, constipation and diarrhea.  Endocrine: Negative for increased urination.  Genitourinary:  Negative for involuntary urination.  Musculoskeletal:  Positive for joint pain, joint pain, joint swelling, myalgias, muscle weakness, morning stiffness, muscle tenderness and myalgias. Negative for gait problem.  Skin:  Negative for color change, rash, hair loss and sensitivity to sunlight.  Allergic/Immunologic: Negative for susceptible to infections.  Neurological:  Negative for dizziness and headaches.  Hematological:  Negative for swollen glands.  Psychiatric/Behavioral:  Positive for sleep disturbance. Negative for depressed mood. The patient is not nervous/anxious.     PMFS History:  Patient Active Problem List   Diagnosis Date Noted   Elevated BP without diagnosis of hypertension 01/21/2023   Preventative health care 04/25/2022   SOB (shortness of breath) 09/25/2020   Inflammatory arthritis 08/09/2020   Bilateral wrist pain 07/11/2020   Fatigue 06/02/2020   Dizziness 06/02/2020   Arthralgia 06/02/2020   Palpitations 06/02/2020   Basal cell carcinoma (BCC) of forehead 04/24/2020   Lactose intolerance    Snoring 05/16/2018   OSA (obstructive sleep apnea) 05/16/2018   RLS (restless legs syndrome) 01/26/2018   Insomnia 01/26/2018   Snores 01/26/2018   Hx of colonic polyps 08/20/2016   BACK PAIN 02/29/2008   Hyperlipidemia 04/23/2007   Osteoarthritis 04/23/2007   DISC DISEASE, LUMBAR 04/23/2007    Past Medical History:  Diagnosis Date   Arthritis    Cataract    bilateral sx   Complication of anesthesia    Per pt, hard to wake up past c-section/ 1 time.   DDD (degenerative disc disease)    in feet and hands    GERD (gastroesophageal reflux disease)    OTC PRN meds   Hx of colonic polyps 08/20/2016   Hyperlipidemia    on meds   Lactose intolerance    Osteopenia    Rheumatoid arthritis (HCC)    Seasonal allergies    Sleep apnea    does not use CPAP   Wrist sprain     Family History  Problem Relation Age of Onset   Hypertension Mother    Dementia Mother    Arthritis Mother    Alzheimer's disease Mother    Arthritis Father    Liver disease Father        NASH   Hypertension Father    Arthritis Sister    Liver disease Sister    Arthritis Brother    Vision loss Brother    Liver disease Brother    Healthy Daughter    Healthy Son    Liver disease Paternal Aunt    Liver disease Paternal Aunt    Liver disease Paternal Aunt    Cystic fibrosis  Other        niece   Colon cancer Neg Hx    Colon polyps Neg Hx    Esophageal cancer Neg Hx    Rectal cancer Neg Hx    Stomach cancer Neg Hx    Past Surgical History:  Procedure Laterality Date   CATARACT EXTRACTION Bilateral 2021   Dr. Elmer Picker   CESAREAN SECTION     1980, 1982   COLONOSCOPY  2018   CG-MAC-Miralax (exc)-polyps   POLYPECTOMY  2018   polyps   TOTAL ABDOMINAL HYSTERECTOMY W/ BILATERAL SALPINGOOPHORECTOMY     Social History   Social History Narrative   Married, 1 son and 1 daughter   Self-employed   2 caffienated beverages/day   Immunization History  Administered Date(s) Administered   Fluad Quad(high Dose 65+) 03/29/2021   Fluad Trivalent(High Dose 65+) 01/21/2023   Influenza, High Dose Seasonal PF 01/23/2018   Influenza,inj,Quad PF,6+ Mos 12/08/2012   Influenza-Unspecified 12/31/2019   PFIZER(Purple Top)SARS-COV-2 Vaccination 04/16/2019, 05/12/2019, 12/31/2019   Pneumococcal Conjugate-13 05/06/2017   Pneumococcal Polysaccharide-23 01/04/2020   Td 06/28/2004   Tdap 05/12/2012   Zoster, Live 05/12/2012     Objective: Vital Signs: BP 133/83 (BP Location: Right Arm, Patient Position: Sitting, Cuff Size:  Normal)   Pulse 78   Resp 14   Ht 5\' 3"  (1.6 m)   Wt 165 lb (74.8 kg)   BMI 29.23 kg/m    Physical Exam Cardiovascular:     Rate and Rhythm: Normal rate and regular rhythm.  Pulmonary:     Effort: Pulmonary effort is normal.     Breath sounds: Normal breath sounds.  Skin:    General: Skin is warm and dry.     Findings: No rash.  Neurological:     Mental Status: She is alert.  Psychiatric:        Mood and Affect: Mood normal.      Musculoskeletal Exam:  Shoulders left side full ROM but with some pain active abduction above horizontal Elbows full ROM no tenderness or swelling Wrists full ROM no tenderness or swelling Fingers full ROM mild chronic 2nd-3rd MCP joint widening with, no palpable swelling and full flexion range of motion Knees full ROM no tenderness or swelling Ankles full ROM no tenderness or swelling    Investigation: No additional findings.  Imaging: No results found.  Recent Labs: Lab Results  Component Value Date   WBC 5.6 01/21/2023   HGB 13.8 01/21/2023   PLT 220.0 01/21/2023   NA 141 01/21/2023   K 4.4 01/21/2023   CL 106 01/21/2023   CO2 25 01/21/2023   GLUCOSE 87 01/21/2023   BUN 18 01/21/2023   CREATININE 0.76 01/21/2023   BILITOT 0.6 01/21/2023   ALKPHOS 75 01/21/2023   AST 18 01/21/2023   ALT 15 01/21/2023   PROT 6.5 01/21/2023   ALBUMIN 4.2 01/21/2023   CALCIUM 9.0 01/21/2023    Speciality Comments: No specialty comments available.  Procedures:  No procedures performed Allergies: Penicillins and Neosporin [bacitracin-polymyxin b]   Assessment / Plan:     Visit Diagnoses: Inflammatory arthritis - If inflammation numbers are high but have low threshold to try resuming low-dose steroids or discussing change in DMARD treatment. - Plan: diclofenac (VOLTAREN) 75 MG EC tablet, DULoxetine (CYMBALTA) 30 MG capsule, hydroxychloroquine (PLAQUENIL) 200 MG tablet  Previous labs normalized for inflammation and no peripheral joint  synovitis appreciable on exam.  Based on this I do not think there is evidence of active inflammatory  arthritis so symptoms are either primarily OA or at least the inflammation is controlled on hydroxychloroquine.  Continue hydroxychloroquine 300 mg daily.  High risk medication use - HCQ 300 mg daily.  Primary osteoarthritis, unspecified site  I believe more of current complaints are related to generalized osteoarthritis.  May be an exacerbation due to winter could be from change activity also may be some exacerbation with recent injury.  We discussed some possible treatments.  Recommend trial of adding duloxetine 30 mg once daily as maintenance drug for chronic OA pain.  Also prescribe diclofenac 75 mg to take as needed in place of naproxen to see if this is more effective.  Also reviewed supplemental treatment options besides prescription medication.  Orders: No orders of the defined types were placed in this encounter.  Meds ordered this encounter  Medications   diclofenac (VOLTAREN) 75 MG EC tablet    Sig: Take 1 tablet (75 mg total) by mouth 2 (two) times daily as needed. Take with food    Dispense:  60 tablet    Refill:  2   DULoxetine (CYMBALTA) 30 MG capsule    Sig: Take 1 capsule (30 mg total) by mouth daily.    Dispense:  30 capsule    Refill:  2   hydroxychloroquine (PLAQUENIL) 200 MG tablet    Sig: Take 1 tablet (200 mg total) by mouth daily. TAKE 1 & 1/2 (ONE & ONE-HALF) TABLETS BY MOUTH ONCE DAILY    Dispense:  135 tablet    Refill:  0     Follow-Up Instructions: Return in about 2 months (around 04/14/2023) for RA/OA on HCQ/+NSAIDs/+SNRI f/u 2mos.   Fuller Plan, MD  Note - This record has been created using [employer].  Chart creation errors have been sought, but may not always  have been located. Such creation errors do not reflect on  the standard of medical care.

## 2023-01-30 DIAGNOSIS — M25532 Pain in left wrist: Secondary | ICD-10-CM | POA: Diagnosis not present

## 2023-02-10 DIAGNOSIS — M25512 Pain in left shoulder: Secondary | ICD-10-CM | POA: Diagnosis not present

## 2023-02-10 DIAGNOSIS — M542 Cervicalgia: Secondary | ICD-10-CM | POA: Diagnosis not present

## 2023-02-11 ENCOUNTER — Encounter: Payer: Self-pay | Admitting: Internal Medicine

## 2023-02-11 ENCOUNTER — Ambulatory Visit: Payer: PPO | Attending: Internal Medicine | Admitting: Internal Medicine

## 2023-02-11 VITALS — BP 133/83 | HR 78 | Resp 14 | Ht 63.0 in | Wt 165.0 lb

## 2023-02-11 DIAGNOSIS — M1991 Primary osteoarthritis, unspecified site: Secondary | ICD-10-CM

## 2023-02-11 DIAGNOSIS — M25511 Pain in right shoulder: Secondary | ICD-10-CM

## 2023-02-11 DIAGNOSIS — G8929 Other chronic pain: Secondary | ICD-10-CM | POA: Diagnosis not present

## 2023-02-11 DIAGNOSIS — G5710 Meralgia paresthetica, unspecified lower limb: Secondary | ICD-10-CM | POA: Diagnosis not present

## 2023-02-11 DIAGNOSIS — M1990 Unspecified osteoarthritis, unspecified site: Secondary | ICD-10-CM

## 2023-02-11 DIAGNOSIS — M25512 Pain in left shoulder: Secondary | ICD-10-CM | POA: Diagnosis not present

## 2023-02-11 DIAGNOSIS — Z79899 Other long term (current) drug therapy: Secondary | ICD-10-CM | POA: Diagnosis not present

## 2023-02-11 MED ORDER — HYDROXYCHLOROQUINE SULFATE 200 MG PO TABS: 200.0000 mg | ORAL_TABLET | Freq: Every day | ORAL | 0 refills | Status: DC

## 2023-02-11 MED ORDER — DICLOFENAC SODIUM 75 MG PO TBEC: 75.0000 mg | DELAYED_RELEASE_TABLET | Freq: Two times a day (BID) | ORAL | 2 refills | Status: DC | PRN

## 2023-02-11 MED ORDER — DULOXETINE HCL 30 MG PO CPEP: 30.0000 mg | ORAL_CAPSULE | Freq: Every day | ORAL | 2 refills | Status: DC

## 2023-04-03 NOTE — Progress Notes (Signed)
 Office Visit Note  Patient: Stephanie [name]             Date of Birth: 1950-04-25           MRN: [id number]             PCP: Antonio Cyndee Jamee JONELLE, DO Referring: Antonio Cyndee Jamee JONELLE, * Visit Date: 04/16/2023   Subjective:  Follow-up (Patient states she is taking 1 tablet of hydroxychloroquine  daily. Patient states she is not sure if she likes the cymbalta  because it make her feel a little zombified. )   Discussed the use of AI scribe software for clinical note transcription with the patient, who gave verbal consent to proceed.  History of Present Illness   Stephanie [name] is a 73 y.o. female here for follow up for seronegative inflammatory arthritis and osteoarthritis on HCQ 200 mg daily, cymbalta  30 mg daily, and diclofenac  75 mg daily.    She has been trying diclofenac  (Voltaren ) instead of naproxen, along with Cymbalta , to manage her symptoms. Her ANA titer has decreased since her last visit, and other inflammatory markers were negative.  She experiences side effects from Cymbalta , feeling like a 'zombie' with a lack of ambition and increased reading, but notes a reduction in pain. She is currently taking Cymbalta  30 mg every two days and Voltaren  once a day instead of twice. She is unsure which medication is most effective for her pain relief.  She has been experiencing gastrointestinal issues, including diarrhea and acid reflux, which she attributes to either her diet or medication. She is lactose intolerant and recently discovered an intolerance to eggs. She has been taking more Tums for acid reflux and is adjusting her diet to avoid triggers like tomato sauce.  She reports dry mouth, particularly at night, and a decreased appetite, leading to weight loss. She notes that she gained weight while on prednisone , but her weight has been decreasing since stopping it a year and a half ago. No recent infections and describes herself as an introvert who does not go out much, which she  believes has kept her healthy.  Her home environment has high humidity, which she maintains for her plants, and she does not use a humidifier. She has a room with high humidity due to her collection of large plants and trees.    Previous HPI 02/11/2023 Stephanie [name] is a 73 y.o. female here for follow up for seronegative inflammatory arthritis on HCQ 300 mg daily.  Overall feels her joint pain is not very well-controlled daily symptoms of bilateral hand pain.  Also currently has increased left sided pain after she fell onto that side while decorating for Christmas.  Had pain at the shoulder and along chest wall this was evaluated and had x-rays not demonstrating any fracture.  She has been taking naproxen as needed but this only partially controls her symptoms and gets upset stomach if taking it at a consistent twice daily dosing.   12/25/2022 Stephanie [name] is a 73 y.o. female here for follow up for seronegative inflammatory arthritis on HCQ 300 mg daily.  Her last was about 18 months ago she had also been taking prednisone  5 mg daily but tapered off of this completely has remained off and this year.  Has followed up with her PCP for continued hydroxychloroquine  prescription as she was tolerating this fine.  Currently has issue with increase in shoulder pain and stiffness on both sides.  Has not seen any new visible  joint effusions or discoloration.  Also with a new problem of abnormal sensation on the right lateral thigh sometimes cold or sometimes feels sensation of numbness or of wetness on the skin surface.   05/02/2021 Stephanie [name] is a 73 y.o. female here for follow up for seronegative inflammatory arthritis on HCQ 300 mg daily and prednisone  5 mg daily. She has felt some gradual recovery of her shortness of breath over time. Pulmonology clinic testing all looked fine. She is taking the prednisone  most of the time, sometimes skipping doses and tends to notice increased pain starting with her  ankles or plantar fasciitis after 2 or 3 days. She has been working with OT for about a month, so far no huge improvements but is slightly increasing wrist ROM.   01/03/21 [name] is a 73 y.o. female here for follow up with seronegative inflammatory arthritis on HCQ 300 mg PO daily and tapering down prednisone  from 10 mg daily to 5 mg daily. Since our visit a series of studies starting from cardiac coronary CT scan showing liver lesions and abdominal CT showing multiple liver nodules possibly hemangiomata but is scheduled for MRI 10/29. She has experienced some abdominal and central facial swelling with the ongoing prednisone  use. She continues to have shortness of breath without cause seen on chest imaging or heart imaging so far.    07/11/20 Stephanie [name] is a 73 y.o. female here for evaluation of positive ANA with fatigue and joint pain symptoms. She has increased pain in her bilateral hands and wrists for about 6 months with no known preceding health events or changes. She hs a prior history of osteoarthritis chronically affecting her fingers especially base of the thumb but not this current problem. She has previously experienced carpal tunnel syndrome when working with sewing but does not think this is similar. She notices improvement with NSAIDs but she cannot tolerate taking high doses of these or multiple times per day due to abdominal and epigastric pain and diarrhea. She does not notice much swelling, warmth or discoloration of the affected areas. Workup in primary care office demonstrated a positive ANA and negative RF test. She denies noticing other new health problems such as hair loss, oral ulcers, lymphadenopathy, raynaud's phenomenon, rashes, or fevers.   Review of Systems  Constitutional:  Negative for fatigue.  HENT:  Positive for mouth dryness. Negative for mouth sores.   Eyes:  Negative for dryness.  Respiratory:  Negative for shortness of breath.   Cardiovascular:  Negative  for chest pain and palpitations.  Gastrointestinal:  Positive for diarrhea. Negative for blood in stool and constipation.  Endocrine: Negative for increased urination.  Genitourinary:  Negative for involuntary urination.  Musculoskeletal:  Positive for joint pain, joint pain and joint swelling. Negative for gait problem, myalgias, muscle weakness, morning stiffness, muscle tenderness and myalgias.  Skin:  Negative for color change, rash, hair loss and sensitivity to sunlight.  Allergic/Immunologic: Negative for susceptible to infections.  Neurological:  Negative for dizziness and headaches.  Hematological:  Negative for swollen glands.  Psychiatric/Behavioral:  Positive for sleep disturbance. Negative for depressed mood. The patient is not nervous/anxious.     PMFS History:  Patient Active Problem List   Diagnosis Date Noted   Elevated BP without diagnosis of hypertension 01/21/2023   Preventative health care 04/25/2022   SOB (shortness of breath) 09/25/2020   Inflammatory arthritis 08/09/2020   Bilateral wrist pain 07/11/2020   Fatigue 06/02/2020   Dizziness 06/02/2020  Arthralgia 06/02/2020   Palpitations 06/02/2020   Basal cell carcinoma (BCC) of forehead 04/24/2020   Lactose intolerance    Snoring 05/16/2018   OSA (obstructive sleep apnea) 05/16/2018   RLS (restless legs syndrome) 01/26/2018   Insomnia 01/26/2018   Snores 01/26/2018   Hx of colonic polyps 08/20/2016   BACK PAIN 02/29/2008   Hyperlipidemia 04/23/2007   Osteoarthritis 04/23/2007   DISC DISEASE, LUMBAR 04/23/2007    Past Medical History:  Diagnosis Date   Arthritis    Cataract    bilateral sx   Complication of anesthesia    Per pt, hard to wake up past c-section/ 1 time.   DDD (degenerative disc disease)    in feet and hands   GERD (gastroesophageal reflux disease)    OTC PRN meds   Hx of colonic polyps 08/20/2016   Hyperlipidemia    on meds   Lactose intolerance    Osteopenia    Rheumatoid  arthritis (HCC)    Seasonal allergies    Sleep apnea    does not use CPAP   Wrist sprain     Family History  Problem Relation Age of Onset   Hypertension Mother    Dementia Mother    Arthritis Mother    Alzheimer's disease Mother    Arthritis Father    Liver disease Father        NASH   Hypertension Father    Arthritis Sister    Liver disease Sister    Arthritis Brother    Vision loss Brother    Liver disease Brother    Healthy Daughter    Healthy Son    Liver disease Paternal Aunt    Liver disease Paternal Aunt    Liver disease Paternal Aunt    Cystic fibrosis Other        niece   Colon cancer Neg Hx    Colon polyps Neg Hx    Esophageal cancer Neg Hx    Rectal cancer Neg Hx    Stomach cancer Neg Hx    Past Surgical History:  Procedure Laterality Date   CATARACT EXTRACTION Bilateral 2021   Dr. Cleatus   CESAREAN SECTION     1980, 1982   COLONOSCOPY  2018   CG-MAC-Miralax (exc)-polyps   POLYPECTOMY  2018   polyps   TOTAL ABDOMINAL HYSTERECTOMY W/ BILATERAL SALPINGOOPHORECTOMY     Social History   Social History Narrative   Married, 1 son and 1 daughter   Self-employed   2 caffienated beverages/day   Immunization History  Administered Date(s) Administered   Fluad Quad(high Dose 65+) 03/29/2021   Fluad Trivalent(High Dose 65+) 01/21/2023   Influenza, High Dose Seasonal PF 01/23/2018   Influenza,inj,Quad PF,6+ Mos 12/08/2012   Influenza-Unspecified 12/31/2019   PFIZER(Purple Top)SARS-COV-2 Vaccination 04/16/2019, 05/12/2019, 12/31/2019   Pneumococcal Conjugate-13 05/06/2017   Pneumococcal Polysaccharide-23 01/04/2020   Td 06/28/2004   Tdap 05/12/2012   Zoster, Live 05/12/2012     Objective: Vital Signs: BP 132/84 (BP Location: Left Arm, Patient Position: Sitting, Cuff Size: Normal)   Pulse 83   Resp 16   Ht 5' 3 (1.6 m)   Wt 159 lb (72.1 kg)   BMI 28.17 kg/m    Physical Exam Eyes:     Conjunctiva/sclera: Conjunctivae normal.  Cardiovascular:      Rate and Rhythm: Normal rate and regular rhythm.  Pulmonary:     Breath sounds: Normal breath sounds.     Comments: Pursed lip breathing Lymphadenopathy:  Cervical: No cervical adenopathy.  Skin:    General: Skin is warm and dry.     Comments: Recently frozen off skin spots on both hands  Neurological:     Mental Status: She is alert.  Psychiatric:        Mood and Affect: Mood normal.      Musculoskeletal Exam:  Shoulders full ROM no tenderness or swelling Elbows full ROM no tenderness or swelling Wrists full ROM no tenderness or swelling Fingers full ROM, heberdon's nodes, no palpable synovitis or focal tenderenss Knees full ROM no tenderness or swelling   Investigation: No additional findings.  Imaging: No results found.  Recent Labs: Lab Results  Component Value Date   WBC 5.6 01/21/2023   HGB 13.8 01/21/2023   PLT 220.0 01/21/2023   NA 141 01/21/2023   K 4.4 01/21/2023   CL 106 01/21/2023   CO2 25 01/21/2023   GLUCOSE 87 01/21/2023   BUN 18 01/21/2023   CREATININE 0.76 01/21/2023   BILITOT 0.6 01/21/2023   ALKPHOS 75 01/21/2023   AST 18 01/21/2023   ALT 15 01/21/2023   PROT 6.5 01/21/2023   ALBUMIN 4.2 01/21/2023   CALCIUM  9.0 01/21/2023    Speciality Comments: No specialty comments available.  Procedures:  No procedures performed Allergies: Penicillins and Neosporin [bacitracin-polymyxin b]   Assessment / Plan:     Visit Diagnoses: Inflammatory arthritis Seronegative inflammatory arthritis Generalized osteoarthritis ANA titer decreased and other measures negative for increased inflammation. Patient reports decreased pain but also decreased ambition and a zombie like feeling since starting Cymbalta  and Diclofenac . -Will try Cymbalta  to 20mg  daily to potentially decrease side effects. -Continue Diclofenac  75 mg once daily. -Checking BMP for kidney function due to potential impact of Diclofenac .  High risk medication use Need to review  or else update HCQ retinal toxicity screening for continued use. Checking BMP on long term use of HCQ and NSAIDs. Had recent CBC normal.  Gastrointestinal Distress Patient reports increased acid reflux and diarrhea. Unclear if symptoms are related to Diclofenac , dietary changes, or new food intolerances. -Continue current regimen of Diclofenac  once daily and monitor symptoms. -Consider dietary modifications to manage acid reflux.  Dry Mouth Patient reports dry mouth, particularly overnight. Possibly related to Cymbalta . -Continue current regimen of Cymbalta  and monitor symptoms.  Weight Loss Patient reports unintentional weight loss. No clear cause identified. -Monitor weight and consider further evaluation if weight loss continues.    Orders: No orders of the defined types were placed in this encounter.  No orders of the defined types were placed in this encounter.    Follow-Up Instructions: No follow-ups on file.   Lonni LELON Ester, MD  Note - This record has been created using [employer].  Chart creation errors have been sought, but may not always  have been located. Such creation errors do not reflect on  the standard of medical care.

## 2023-04-10 DIAGNOSIS — Z85828 Personal history of other malignant neoplasm of skin: Secondary | ICD-10-CM | POA: Diagnosis not present

## 2023-04-10 DIAGNOSIS — I788 Other diseases of capillaries: Secondary | ICD-10-CM | POA: Diagnosis not present

## 2023-04-10 DIAGNOSIS — L821 Other seborrheic keratosis: Secondary | ICD-10-CM | POA: Diagnosis not present

## 2023-04-10 DIAGNOSIS — D225 Melanocytic nevi of trunk: Secondary | ICD-10-CM | POA: Diagnosis not present

## 2023-04-10 DIAGNOSIS — L570 Actinic keratosis: Secondary | ICD-10-CM | POA: Diagnosis not present

## 2023-04-10 DIAGNOSIS — D1801 Hemangioma of skin and subcutaneous tissue: Secondary | ICD-10-CM | POA: Diagnosis not present

## 2023-04-10 DIAGNOSIS — D485 Neoplasm of uncertain behavior of skin: Secondary | ICD-10-CM | POA: Diagnosis not present

## 2023-04-16 ENCOUNTER — Encounter: Payer: Self-pay | Admitting: Internal Medicine

## 2023-04-16 ENCOUNTER — Ambulatory Visit: Payer: PPO | Attending: Internal Medicine | Admitting: Internal Medicine

## 2023-04-16 VITALS — BP 132/84 | HR 83 | Resp 16 | Ht 63.0 in | Wt 159.0 lb

## 2023-04-16 DIAGNOSIS — M1991 Primary osteoarthritis, unspecified site: Secondary | ICD-10-CM

## 2023-04-16 DIAGNOSIS — M1990 Unspecified osteoarthritis, unspecified site: Secondary | ICD-10-CM

## 2023-04-16 DIAGNOSIS — Z79899 Other long term (current) drug therapy: Secondary | ICD-10-CM | POA: Diagnosis not present

## 2023-04-16 MED ORDER — HYDROXYCHLOROQUINE SULFATE 200 MG PO TABS: 200.0000 mg | ORAL_TABLET | Freq: Every day | ORAL | 1 refills | Status: DC

## 2023-04-16 MED ORDER — DULOXETINE HCL 20 MG PO CPEP: 20.0000 mg | ORAL_CAPSULE | Freq: Every day | ORAL | 5 refills | Status: DC

## 2023-04-17 LAB — BASIC METABOLIC PANEL WITH GFR
BUN: 16 mg/dL (ref 7–25)
CO2: 25 mmol/L (ref 20–32)
Calcium: 9.7 mg/dL (ref 8.6–10.4)
Chloride: 103 mmol/L (ref 98–110)
Creat: 0.83 mg/dL (ref 0.60–1.00)
Glucose, Bld: 83 mg/dL (ref 65–99)
Potassium: 5.3 mmol/L (ref 3.5–5.3)
Sodium: 138 mmol/L (ref 135–146)
eGFR: 75 mL/min/{1.73_m2} (ref >=60)

## 2023-04-17 LAB — SEDIMENTATION RATE: Sed Rate: 2 mm/h (ref 0–30)

## 2023-04-29 ENCOUNTER — Encounter: Payer: Self-pay | Admitting: Family Medicine

## 2023-04-29 ENCOUNTER — Ambulatory Visit (INDEPENDENT_AMBULATORY_CARE_PROVIDER_SITE_OTHER): Payer: PPO | Admitting: Family Medicine

## 2023-04-29 VITALS — BP 120/78 | HR 72 | Temp 97.7°F | Resp 18 | Ht 63.0 in | Wt 160.8 lb

## 2023-04-29 DIAGNOSIS — M1990 Unspecified osteoarthritis, unspecified site: Secondary | ICD-10-CM | POA: Diagnosis not present

## 2023-04-29 DIAGNOSIS — E785 Hyperlipidemia, unspecified: Secondary | ICD-10-CM

## 2023-04-29 DIAGNOSIS — G2581 Restless legs syndrome: Secondary | ICD-10-CM

## 2023-04-29 DIAGNOSIS — Z0000 Encounter for general adult medical examination without abnormal findings: Secondary | ICD-10-CM | POA: Diagnosis not present

## 2023-04-29 LAB — CBC WITH DIFFERENTIAL/PLATELET
Basophils Absolute: 0.1 10*3/uL (ref 0.0–0.1)
Basophils Relative: 1.3 % (ref 0.0–3.0)
Eosinophils Absolute: 0.2 10*3/uL (ref 0.0–0.7)
Eosinophils Relative: 4.1 % (ref 0.0–5.0)
HCT: 40.6 % (ref 36.0–46.0)
Hemoglobin: 13.5 g/dL (ref 12.0–15.0)
Lymphocytes Relative: 28.8 % (ref 12.0–46.0)
Lymphs Abs: 1.5 10*3/uL (ref 0.7–4.0)
MCHC: 33.2 g/dL (ref 30.0–36.0)
MCV: 94.5 fL (ref 78.0–100.0)
Monocytes Absolute: 0.6 10*3/uL (ref 0.1–1.0)
Monocytes Relative: 12.1 % — ABNORMAL HIGH (ref 3.0–12.0)
Neutro Abs: 2.8 10*3/uL (ref 1.4–7.7)
Neutrophils Relative %: 53.7 % (ref 43.0–77.0)
Platelets: 222 10*3/uL (ref 150.0–400.0)
RBC: 4.3 Mil/uL (ref 3.87–5.11)
RDW: 13.8 % (ref 11.5–15.5)
WBC: 5.2 10*3/uL (ref 4.0–10.5)

## 2023-04-29 LAB — COMPREHENSIVE METABOLIC PANEL
ALT: 22 U/L (ref 0–35)
AST: 23 U/L (ref 0–37)
Albumin: 4.3 g/dL (ref 3.5–5.2)
Alkaline Phosphatase: 66 U/L (ref 39–117)
BUN: 18 mg/dL (ref 6–23)
CO2: 25 meq/L (ref 19–32)
Calcium: 9.2 mg/dL (ref 8.4–10.5)
Chloride: 106 meq/L (ref 96–112)
Creatinine, Ser: 0.75 mg/dL (ref 0.40–1.20)
GFR: 80.27 mL/min (ref >60.00)
Glucose, Bld: 96 mg/dL (ref 70–99)
Potassium: 4.9 meq/L (ref 3.5–5.1)
Sodium: 140 meq/L (ref 135–145)
Total Bilirubin: 0.8 mg/dL (ref 0.2–1.2)
Total Protein: 6.7 g/dL (ref 6.0–8.3)

## 2023-04-29 LAB — LIPID PANEL
Cholesterol: 187 mg/dL (ref 0–200)
HDL: 87.7 mg/dL (ref >39.00)
LDL Cholesterol: 82 mg/dL (ref 0–99)
NonHDL: 99.57
Total CHOL/HDL Ratio: 2
Triglycerides: 87 mg/dL (ref 0.0–149.0)
VLDL: 17.4 mg/dL (ref 0.0–40.0)

## 2023-04-29 LAB — TSH: TSH: 3.27 u[IU]/mL (ref 0.35–5.50)

## 2023-04-29 NOTE — Progress Notes (Signed)
 Established Patient Office Visit  Subjective   Patient ID: Stephanie [name], female    DOB: 08/25/1950  Age: 73 y.o. MRN: [id number]  Chief Complaint  Patient presents with   Annual Exam    Pt states fasting     HPI Discussed the use of AI scribe software for clinical note transcription with the patient, who gave verbal consent to proceed.  History of Present Illness   Kimberlye Dilger [name] "Elita Quick" is a 73 year old female who presents for an annual physical exam.  She has recently experienced a change in her arthritis medication. Initially prescribed Cymbalta at 30 mg, the dose was reduced to 20 mg due to side effects, including feeling like a 'zombie' and experiencing significant xerostomia. She feels better on the reduced dose, although the xerostomia persists, potentially due to a combination of medications, including Unisom for sleep.  She has a history of high ANA levels, which led to considerations of lupus or rheumatoid arthritis. Her ANA levels have decreased over time, and she has not received the COVID vaccine based on previous advice. She notes that her ANA levels have continued to drop since not receiving the vaccine.  She has not received the shingles vaccine or a tetanus booster. She has had her influenza and pneumonia vaccines. She has not scheduled a mammogram and is uncertain about future colonoscopies.  She is currently taking Plaquenil and acknowledges the need for regular ophthalmologic exams due to this medication. She visits the dentist twice a year but does not regularly see an ophthalmologist.  There is a family history of her father passing away suddenly at the age of 71, two weeks after receiving a COVID vaccine. No other changes in her family history are reported.      Patient Active Problem List   Diagnosis Date Noted   Elevated BP without diagnosis of hypertension 01/21/2023   Preventative health care 04/25/2022   SOB (shortness of breath) 09/25/2020    Inflammatory arthritis 08/09/2020   Bilateral wrist pain 07/11/2020   Fatigue 06/02/2020   Dizziness 06/02/2020   Arthralgia 06/02/2020   Palpitations 06/02/2020   Basal cell carcinoma (BCC) of forehead 04/24/2020   Lactose intolerance    Snoring 05/16/2018   OSA (obstructive sleep apnea) 05/16/2018   RLS (restless legs syndrome) 01/26/2018   Insomnia 01/26/2018   Snores 01/26/2018   Hx of colonic polyps 08/20/2016   BACK PAIN 02/29/2008   Hyperlipidemia 04/23/2007   Osteoarthritis 04/23/2007   DISC DISEASE, LUMBAR 04/23/2007   Past Medical History:  Diagnosis Date   Arthritis    Cataract    bilateral sx   Complication of anesthesia    Per pt, hard to wake up past c-section/ 1 time.   DDD (degenerative disc disease)    in feet and hands   GERD (gastroesophageal reflux disease)    OTC PRN meds   Hx of colonic polyps 08/20/2016   Hyperlipidemia    on meds   Lactose intolerance    Osteopenia    Rheumatoid arthritis (HCC)    Seasonal allergies    Sleep apnea    does not use CPAP   Wrist sprain    Past Surgical History:  Procedure Laterality Date   CATARACT EXTRACTION Bilateral 2021   Dr. Ericka Pontiff SECTION     1980, 1982   COLONOSCOPY  2018   CG-MAC-Miralax (exc)-polyps   POLYPECTOMY  2018   polyps   TOTAL ABDOMINAL HYSTERECTOMY W/ BILATERAL SALPINGOOPHORECTOMY  Social History   Tobacco Use   Smoking status: Never    Passive exposure: Never   Smokeless tobacco: Never  Vaping Use   Vaping status: Never Used  Substance Use Topics   Alcohol use: Yes    Alcohol/week: 7.0 standard drinks of alcohol    Types: 7 Standard drinks or equivalent per week   Drug use: No   Social History   Socioeconomic History   Marital status: Married    Spouse name: Not on file   Number of children: 2   Years of education: Not on file   Highest education level: Not on file  Occupational History   Occupation: self  Tobacco Use   Smoking status: Never     Passive exposure: Never   Smokeless tobacco: Never  Vaping Use   Vaping status: Never Used  Substance and Sexual Activity   Alcohol use: Yes    Alcohol/week: 7.0 standard drinks of alcohol    Types: 7 Standard drinks or equivalent per week   Drug use: No   Sexual activity: Yes    Partners: Male  Other Topics Concern   Not on file  Social History Narrative   Married, 1 son and 1 daughter   Self-employed   2 caffienated beverages/day   Social Drivers of Health   Financial Resource Strain: Low Risk  (04/24/2021)   Overall Financial Resource Strain (CARDIA)    Difficulty of Paying Living Expenses: Not hard at all  Food Insecurity: No Food Insecurity (04/24/2021)   Hunger Vital Sign    Worried About Running Out of Food in the Last Year: Never true    Ran Out of Food in the Last Year: Never true  Transportation Needs: No Transportation Needs (04/24/2021)   PRAPARE - Administrator, Civil Service (Medical): No    Lack of Transportation (Non-Medical): No  Physical Activity: Inactive (04/24/2021)   Exercise Vital Sign    Days of Exercise per Week: 0 days    Minutes of Exercise per Session: 0 min  Stress: No Stress Concern Present (04/24/2021)   [employer] of Occupational Health - Occupational Stress Questionnaire    Feeling of Stress : Not at all  Social Connections: Socially Integrated (04/24/2021)   Social Connection and Isolation Panel [NHANES]    Frequency of Communication with Friends and Family: Twice a week    Frequency of Social Gatherings with Friends and Family: More than three times a week    Attends Religious Services: 1 to 4 times per year    Active Member of [employer] or Organizations: Yes    Attends Banker Meetings: More than 4 times per year    Marital Status: Married  Catering manager Violence: Not At Risk (04/24/2021)   Humiliation, Afraid, Rape, and Kick questionnaire    Fear of Current or Ex-Partner: No    Emotionally Abused: No     Physically Abused: No    Sexually Abused: No   Family Status  Relation Name Status   Mother  Deceased   Father  Deceased   Sister  Alive   Brother  Alive   Brother  Alive   Daughter  Alive   Son  Alive   Pat Aunt  (Not Specified)   Emelda Brothers  (Not Specified)   Emelda Brothers  (Not Specified)   Other  (Not Specified)   Neg Hx  (Not Specified)  No partnership data on file   Family History  Problem Relation Age of  Onset   Hypertension Mother    Dementia Mother    Arthritis Mother    Alzheimer's disease Mother    Arthritis Father    Liver disease Father        NASH   Hypertension Father    Arthritis Sister    Liver disease Sister    Arthritis Brother    Vision loss Brother    Liver disease Brother    Healthy Daughter    Healthy Son    Liver disease Paternal Aunt    Liver disease Paternal Aunt    Liver disease Paternal Aunt    Cystic fibrosis Other        niece   Colon cancer Neg Hx    Colon polyps Neg Hx    Esophageal cancer Neg Hx    Rectal cancer Neg Hx    Stomach cancer Neg Hx    Allergies  Allergen Reactions   Penicillins Anaphylaxis   Neosporin [Bacitracin-Polymyxin B] Dermatitis      Review of Systems  Constitutional:  Negative for fever and malaise/fatigue.  HENT:  Negative for congestion.   Eyes:  Negative for blurred vision.  Respiratory:  Negative for shortness of breath.   Cardiovascular:  Negative for chest pain, palpitations and leg swelling.  Gastrointestinal:  Negative for abdominal pain, blood in stool and nausea.  Genitourinary:  Negative for dysuria and frequency.  Musculoskeletal:  Negative for falls.  Skin:  Negative for rash.  Neurological:  Negative for dizziness, loss of consciousness and headaches.  Endo/Heme/Allergies:  Negative for environmental allergies.  Psychiatric/Behavioral:  Negative for depression. The patient is not nervous/anxious.       Objective:     BP 120/78 (BP Location: Left Arm, Patient Position: Sitting)    Pulse 72   Temp 97.7 F (36.5 C) (Oral)   Resp 18   Ht 5\' 3"  (1.6 m)   Wt 160 lb 12.8 oz (72.9 kg)   SpO2 99%   BMI 28.48 kg/m  BP Readings from Last 3 Encounters:  04/29/23 120/78  04/16/23 132/84  02/11/23 133/83   Wt Readings from Last 3 Encounters:  04/29/23 160 lb 12.8 oz (72.9 kg)  04/16/23 159 lb (72.1 kg)  02/11/23 165 lb (74.8 kg)   SpO2 Readings from Last 3 Encounters:  04/29/23 99%  01/21/23 100%  04/25/22 97%      Physical Exam Vitals and nursing note reviewed.  Constitutional:      General: She is not in acute distress.    Appearance: Normal appearance. She is well-developed.  HENT:     Head: Normocephalic and atraumatic.     Right Ear: Tympanic membrane, ear canal and external ear normal. There is no impacted cerumen.     Left Ear: Tympanic membrane, ear canal and external ear normal. There is no impacted cerumen.     Nose: Nose normal.     Mouth/Throat:     Mouth: Mucous membranes are moist.     Pharynx: Oropharynx is clear. No oropharyngeal exudate or posterior oropharyngeal erythema.  Eyes:     General: No scleral icterus.       Right eye: No discharge.        Left eye: No discharge.     Conjunctiva/sclera: Conjunctivae normal.     Pupils: Pupils are equal, round, and reactive to light.  Neck:     Thyroid: No thyromegaly or thyroid tenderness.     Vascular: No JVD.  Cardiovascular:     Rate and Rhythm:  Normal rate and regular rhythm.     Heart sounds: Normal heart sounds. No murmur heard. Pulmonary:     Effort: Pulmonary effort is normal. No respiratory distress.     Breath sounds: Normal breath sounds.  Abdominal:     General: Bowel sounds are normal. There is no distension.     Palpations: Abdomen is soft. There is no mass.     Tenderness: There is no abdominal tenderness. There is no guarding or rebound.  Genitourinary:    Vagina: Normal.  Musculoskeletal:        General: Normal range of motion.     Cervical back: Normal range of  motion and neck supple.     Right lower leg: No edema.     Left lower leg: No edema.  Lymphadenopathy:     Cervical: No cervical adenopathy.  Skin:    General: Skin is warm and dry.     Findings: No erythema or rash.  Neurological:     Mental Status: She is alert and oriented to person, place, and time.     Cranial Nerves: No cranial nerve deficit.     Deep Tendon Reflexes: Reflexes are normal and symmetric.  Psychiatric:        Mood and Affect: Mood normal.        Behavior: Behavior normal.        Thought Content: Thought content normal.        Judgment: Judgment normal.      Results for orders placed or performed in visit on 04/29/23  CBC with Differential/Platelet  Result Value Ref Range   WBC 5.2 4.0 - 10.5 K/uL   RBC 4.30 3.87 - 5.11 Mil/uL   Hemoglobin 13.5 12.0 - 15.0 g/dL   HCT 09.8 11.9 - 14.7 %   MCV 94.5 78.0 - 100.0 fl   MCHC 33.2 30.0 - 36.0 g/dL   RDW 82.9 56.2 - 13.0 %   Platelets 222.0 150.0 - 400.0 K/uL   Neutrophils Relative % 53.7 43.0 - 77.0 %   Lymphocytes Relative 28.8 12.0 - 46.0 %   Monocytes Relative 12.1 (H) 3.0 - 12.0 %   Eosinophils Relative 4.1 0.0 - 5.0 %   Basophils Relative 1.3 0.0 - 3.0 %   Neutro Abs 2.8 1.4 - 7.7 K/uL   Lymphs Abs 1.5 0.7 - 4.0 K/uL   Monocytes Absolute 0.6 0.1 - 1.0 K/uL   Eosinophils Absolute 0.2 0.0 - 0.7 K/uL   Basophils Absolute 0.1 0.0 - 0.1 K/uL  Comprehensive metabolic panel  Result Value Ref Range   Sodium 140 135 - 145 mEq/L   Potassium 4.9 3.5 - 5.1 mEq/L   Chloride 106 96 - 112 mEq/L   CO2 25 19 - 32 mEq/L   Glucose, Bld 96 70 - 99 mg/dL   BUN 18 6 - 23 mg/dL   Creatinine, Ser 8.65 0.40 - 1.20 mg/dL   Total Bilirubin 0.8 0.2 - 1.2 mg/dL   Alkaline Phosphatase 66 39 - 117 U/L   AST 23 0 - 37 U/L   ALT 22 0 - 35 U/L   Total Protein 6.7 6.0 - 8.3 g/dL   Albumin 4.3 3.5 - 5.2 g/dL   GFR 78.46 >96.29 mL/min   Calcium 9.2 8.4 - 10.5 mg/dL  Lipid panel  Result Value Ref Range   Cholesterol 187 0 -  200 mg/dL   Triglycerides 52.8 0.0 - 149.0 mg/dL   HDL 41.32 >44.01 mg/dL   VLDL 02.7 0.0 -  40.0 mg/dL   LDL Cholesterol 82 0 - 99 mg/dL   Total CHOL/HDL Ratio 2    NonHDL 99.57   TSH  Result Value Ref Range   TSH 3.27 0.35 - 5.50 uIU/mL    Last CBC Lab Results  Component Value Date   WBC 5.2 04/29/2023   HGB 13.5 04/29/2023   HCT 40.6 04/29/2023   MCV 94.5 04/29/2023   MCH 30.6 01/04/2020   RDW 13.8 04/29/2023   PLT 222.0 04/29/2023   Last metabolic panel Lab Results  Component Value Date   GLUCOSE 96 04/29/2023   NA 140 04/29/2023   K 4.9 04/29/2023   CL 106 04/29/2023   CO2 25 04/29/2023   BUN 18 04/29/2023   CREATININE 0.75 04/29/2023   GFR 80.27 04/29/2023   CALCIUM 9.2 04/29/2023   PROT 6.7 04/29/2023   ALBUMIN 4.3 04/29/2023   BILITOT 0.8 04/29/2023   ALKPHOS 66 04/29/2023   AST 23 04/29/2023   ALT 22 04/29/2023   Last lipids Lab Results  Component Value Date   CHOL 187 04/29/2023   HDL 87.70 04/29/2023   LDLCALC 82 04/29/2023   LDLDIRECT 103.2 10/15/2011   TRIG 87.0 04/29/2023   CHOLHDL 2 04/29/2023   Last hemoglobin A1c No results found for: "HGBA1C" Last thyroid functions Lab Results  Component Value Date   TSH 3.27 04/29/2023   Last vitamin D Lab Results  Component Value Date   VD25OH 34.79 09/25/2020   Last vitamin B12 and Folate Lab Results  Component Value Date   VITAMINB12 918 (H) 09/25/2020      The 10-year ASCVD risk score (Arnett DK, et al., 2019) is: 8.6%    Assessment & Plan:   Problem List Items Addressed This Visit       Unprioritized   RLS (restless legs syndrome)   Inflammatory arthritis   Relevant Orders   CBC with Differential/Platelet (Completed)   Comprehensive metabolic panel (Completed)   Lipid panel (Completed)   TSH (Completed)   Hyperlipidemia (Chronic)   Relevant Orders   CBC with Differential/Platelet (Completed)   Comprehensive metabolic panel (Completed)   Lipid panel (Completed)   TSH  (Completed)   Preventative health care - Primary   Ghm utd Check labs  See AVS  Health Maintenance  Topic Date Due   Medicare Annual Wellness (AWV)  04/24/2022   DTaP/Tdap/Td (3 - Td or Tdap) 05/13/2022   COVID-19 Vaccine (4 - 2024-25 season) 11/10/2022   Zoster Vaccines- Shingrix (1 of 2) 07/27/2023 (Originally 03/15/1969)   MAMMOGRAM  04/28/2024 (Originally 04/02/2022)   Colonoscopy  05/18/2024   Pneumonia Vaccine 8+ Years old  Completed   INFLUENZA VACCINE  Completed   DEXA SCAN  Completed   Hepatitis C Screening  Completed   HPV VACCINES  Aged Out        Assessment and Plan    Arthritis Currently taking Cymbalta 20 mg for arthritis, with symptoms improved after reducing the initial 30 mg dose due to excessive sedation and dry mouth. Benefits of continuing Cymbalta at the current dose were discussed, and the plan is to maintain this dosage.  Dry Mouth Experiencing dry mouth, likely from Cymbalta and Unisom. Discussed the option of discontinuing Unisom to assess symptom improvement, which is under consideration.  COVID-19 Vaccination Advised by Dr. Dimple Casey to avoid further COVID-19 vaccinations due to high ANA levels and potential autoimmune concerns. ANA levels have decreased since stopping the vaccine. After discussing personal and family adverse experiences, the decision is to  forgo further vaccinations.  Plaquenil Monitoring On Plaquenil, necessitating regular eye exams to monitor for retinal toxicity. The importance of ophthalmologic evaluations was emphasized, and regular eye exams will be scheduled.  General Health Maintenance During a routine physical examination, the importance of vaccinations and screenings was discussed. Shingles and tetanus vaccines have not been received, and there is hesitancy about scheduling a mammogram. The colonoscopy is up to date, and flu and pneumonia vaccines are current. The plan includes administering the shingles and tetanus vaccines,  scheduling a mammogram if desired, and conducting basic labs.  Follow-up A follow-up will occur after lab results are available, and vaccines will be administered at the pharmacy downstairs.        Return in about 6 months (around 10/27/2023), or if symptoms worsen or fail to improve.    Donato Schultz, DO

## 2023-04-29 NOTE — Patient Instructions (Signed)
 Preventive Care 43 Years and Older, Female Preventive care refers to lifestyle choices and visits with your health care provider that can promote health and wellness. Preventive care visits are also called wellness exams. What can I expect for my preventive care visit? Counseling Your health care provider may ask you questions about your: Medical history, including: Past medical problems. Family medical history. Pregnancy and menstrual history. History of falls. Current health, including: Memory and ability to understand (cognition). Emotional well-being. Home life and relationship well-being. Sexual activity and sexual health. Lifestyle, including: Alcohol, nicotine or tobacco, and drug use. Access to firearms. Diet, exercise, and sleep habits. Work and work Astronomer. Sunscreen use. Safety issues such as seatbelt and bike helmet use. Physical exam Your health care provider will check your: Height and weight. These may be used to calculate your BMI (body mass index). BMI is a measurement that tells if you are at a healthy weight. Waist circumference. This measures the distance around your waistline. This measurement also tells if you are at a healthy weight and may help predict your risk of certain diseases, such as type 2 diabetes and high blood pressure. Heart rate and blood pressure. Body temperature. Skin for abnormal spots. What immunizations do I need?  Vaccines are usually given at various ages, according to a schedule. Your health care provider will recommend vaccines for you based on your age, medical history, and lifestyle or other factors, such as travel or where you work. What tests do I need? Screening Your health care provider may recommend screening tests for certain conditions. This may include: Lipid and cholesterol levels. Hepatitis C test. Hepatitis B test. HIV (human immunodeficiency virus) test. STI (sexually transmitted infection) testing, if you are at  risk. Lung cancer screening. Colorectal cancer screening. Diabetes screening. This is done by checking your blood sugar (glucose) after you have not eaten for a while (fasting). Mammogram. Talk with your health care provider about how often you should have regular mammograms. BRCA-related cancer screening. This may be done if you have a family history of breast, ovarian, tubal, or peritoneal cancers. Bone density scan. This is done to screen for osteoporosis. Talk with your health care provider about your test results, treatment options, and if necessary, the need for more tests. Follow these instructions at home: Eating and drinking  Eat a diet that includes fresh fruits and vegetables, whole grains, lean protein, and low-fat dairy products. Limit your intake of foods with high amounts of sugar, saturated fats, and salt. Take vitamin and mineral supplements as recommended by your health care provider. Do not drink alcohol if your health care provider tells you not to drink. If you drink alcohol: Limit how much you have to 0-1 drink a day. Know how much alcohol is in your drink. In the [place] one drink equals one 12 oz bottle of beer (355 mL), one 5 oz glass of wine (148 mL), or one 1 oz glass of hard liquor (44 mL). Lifestyle Brush your teeth every morning and night with fluoride toothpaste. Floss one time each day. Exercise for at least 30 minutes 5 or more days each week. Do not use any products that contain nicotine or tobacco. These products include cigarettes, chewing tobacco, and vaping devices, such as e-cigarettes. If you need help quitting, ask your health care provider. Do not use drugs. If you are sexually active, practice safe sex. Use a condom or other form of protection in order to prevent STIs. Take aspirin only as told by  your health care provider. Make sure that you understand how much to take and what form to take. Work with your health care provider to find out whether it  is safe and beneficial for you to take aspirin daily. Ask your health care provider if you need to take a cholesterol-lowering medicine (statin). Find healthy ways to manage stress, such as: Meditation, yoga, or listening to music. Journaling. Talking to a trusted person. Spending time with friends and family. Minimize exposure to UV radiation to reduce your risk of skin cancer. Safety Always wear your seat belt while driving or riding in a vehicle. Do not drive: If you have been drinking alcohol. Do not ride with someone who has been drinking. When you are tired or distracted. While texting. If you have been using any mind-altering substances or drugs. Wear a helmet and other protective equipment during sports activities. If you have firearms in your house, make sure you follow all gun safety procedures. What's next? Visit your health care provider once a year for an annual wellness visit. Ask your health care provider how often you should have your eyes and teeth checked. Stay up to date on all vaccines. This information is not intended to replace advice given to you by your health care provider. Make sure you discuss any questions you have with your health care provider. Document Revised: 08/23/2020 Document Reviewed: 08/23/2020 Elsevier Patient Education  2024 [employer].

## 2023-04-29 NOTE — Assessment & Plan Note (Signed)
 Ghm utd Check labs  See AVS  Health Maintenance  Topic Date Due   Medicare Annual Wellness (AWV)  04/24/2022   DTaP/Tdap/Td (3 - Td or Tdap) 05/13/2022   COVID-19 Vaccine (4 - 2024-25 season) 11/10/2022   Zoster Vaccines- Shingrix (1 of 2) 07/27/2023 (Originally 03/15/1969)   MAMMOGRAM  04/28/2024 (Originally 04/02/2022)   Colonoscopy  05/18/2024   Pneumonia Vaccine 27+ Years old  Completed   INFLUENZA VACCINE  Completed   DEXA SCAN  Completed   Hepatitis C Screening  Completed   HPV VACCINES  Aged Out

## 2023-05-07 ENCOUNTER — Encounter: Payer: Self-pay | Admitting: Family Medicine

## 2023-06-09 ENCOUNTER — Other Ambulatory Visit: Payer: Self-pay | Admitting: Internal Medicine

## 2023-06-09 ENCOUNTER — Other Ambulatory Visit: Payer: Self-pay | Admitting: Family Medicine

## 2023-06-09 DIAGNOSIS — M1990 Unspecified osteoarthritis, unspecified site: Secondary | ICD-10-CM

## 2023-06-09 DIAGNOSIS — G2581 Restless legs syndrome: Secondary | ICD-10-CM

## 2023-06-09 NOTE — Telephone Encounter (Signed)
 Last Fill: 02/11/2023  Labs: 04/29/2023 Monocytes Relative 12.1  Next Visit: 10/22/2023  Last Visit: 04/16/2023  DX: Inflammatory arthritis   Current Dose per office note 04/16/2023:  Diclofenac 75 mg once daily.   Okay to refill Diclofenac?

## 2023-06-20 ENCOUNTER — Other Ambulatory Visit: Payer: Self-pay | Admitting: Family Medicine

## 2023-06-20 DIAGNOSIS — E785 Hyperlipidemia, unspecified: Secondary | ICD-10-CM

## 2023-07-16 ENCOUNTER — Telehealth: Payer: Self-pay | Admitting: Family Medicine

## 2023-07-16 NOTE — Telephone Encounter (Signed)
 Copied from CRM [id number]. Topic: Medicare AWV >> Jul 16, 2023  2:17 PM Juliana Ocean wrote: Reason for CRM: LVM 07/16/2023 to schedule AWV. Please schedule Virtual or Telehealth visits ONLY  Stephanie [name]; Care Guide Ambulatory Clinical Support [place] [facility] Direct Dial: [phone]

## 2023-09-15 ENCOUNTER — Telehealth: Payer: Self-pay | Admitting: Family Medicine

## 2023-09-15 NOTE — Telephone Encounter (Signed)
 Copied from CRM [id number]. Topic: Medicare AWV >> Sep 15, 2023 12:57 PM Nathanel DEL wrote: Reason for CRM: LVM 09/15/2023 to schedule AWV. Please schedule Virtual or Telehealth visits ONLY.   Nathanel Paschal; Care Guide Ambulatory Clinical Support [place] [facility] Direct Dial: [phone]

## 2023-10-10 NOTE — Progress Notes (Signed)
 Office Visit Note  Patient: Stephanie [name]             Date of Birth: 03/11/1951           MRN: [id number]             PCP: Antonio Cyndee Jamee JONELLE, DO Referring: Antonio Cyndee Jamee JONELLE, * Visit Date: 10/22/2023   Subjective:  Follow-up (Saturday patient started having some brain fog, has gotten better but still noticeable.)    Discussed the use of AI scribe software for clinical note transcription with the patient, who gave verbal consent to proceed.  History of Present Illness   Stephanie [name] is a 73 y.o. female here for follow up for seronegative inflammatory arthritis and osteoarthritis on HCQ 200 mg daily, cymbalta  20 mg daily, and diclofenac  75 mg daily.    She has been experiencing significant brain fog since Saturday after returning from a vacation. Described as 'really bad,' it felt like not having drunk coffee despite doing so. This brain fog persisted until the previous night, affecting her confidence in driving due to feeling like her 'automatic pilot had been turned off.' She notes feeling somewhat better today.  She was recently exposed to COVID-19 through her son-in-law, with whom she spent 8 to 11 days. However, she has not experienced fever, cough, or other symptoms associated with COVID-19.  Her sleep schedule was significantly altered during her trip, and she took more Requip  than usual to manage symptoms during long plane rides and a train journey in [place] . She took Requip  throughout the day on travel days.  She has a history of joint pain and stiffness but has not taken tramadol  in a long time. She continues to take diclofenac  once daily. She notes a pinkish coloration around her eyes that has been present since the last visit, without puffiness or significant change.  No recent viral illnesses or antibiotic use since the last visit. No unusual bruising, although she engages in activities like cutting rhododendron with a chainsaw.  She has noticed weight loss,  having lost ten pounds prior to the last visit, and her weight has remained stable since then. She attributes this to efforts to maintain her weight, even after an 'all you can eat fruit cruise.'        Previous HPI 04/16/2023 Stephanie [name] is a 73 y.o. female here for follow up for seronegative inflammatory arthritis and osteoarthritis on HCQ 200 mg daily, cymbalta  30 mg daily, and diclofenac  75 mg daily.     She has been trying diclofenac  (Voltaren ) instead of naproxen, along with Cymbalta , to manage her symptoms. Her ANA titer has decreased since her last visit, and other inflammatory markers were negative.   She experiences side effects from Cymbalta , feeling like a 'zombie' with a lack of ambition and increased reading, but notes a reduction in pain. She is currently taking Cymbalta  30 mg every two days and Voltaren  once a day instead of twice. She is unsure which medication is most effective for her pain relief.   She has been experiencing gastrointestinal issues, including diarrhea and acid reflux, which she attributes to either her diet or medication. She is lactose intolerant and recently discovered an intolerance to eggs. She has been taking more Tums for acid reflux and is adjusting her diet to avoid triggers like tomato sauce.   She reports dry mouth, particularly at night, and a decreased appetite, leading to weight loss. She notes that she gained weight while  on prednisone , but her weight has been decreasing since stopping it a year and a half ago. No recent infections and describes herself as an introvert who does not go out much, which she believes has kept her healthy.   Her home environment has high humidity, which she maintains for her plants, and she does not use a humidifier. She has a room with high humidity due to her collection of large plants and trees.      Previous HPI 02/11/2023 Stephanie [name] is a 73 y.o. female here for follow up for seronegative inflammatory  arthritis on HCQ 300 mg daily.  Overall feels her joint pain is not very well-controlled daily symptoms of bilateral hand pain.  Also currently has increased left sided pain after she fell onto that side while decorating for Christmas.  Had pain at the shoulder and along chest wall this was evaluated and had x-rays not demonstrating any fracture.  She has been taking naproxen as needed but this only partially controls her symptoms and gets upset stomach if taking it at a consistent twice daily dosing.   12/25/2022 Stephanie [name] is a 73 y.o. female here for follow up for seronegative inflammatory arthritis on HCQ 300 mg daily.  Her last was about 18 months ago she had also been taking prednisone  5 mg daily but tapered off of this completely has remained off and this year.  Has followed up with her PCP for continued hydroxychloroquine  prescription as she was tolerating this fine.  Currently has issue with increase in shoulder pain and stiffness on both sides.  Has not seen any new visible joint effusions or discoloration.  Also with a new problem of abnormal sensation on the right lateral thigh sometimes cold or sometimes feels sensation of numbness or of wetness on the skin surface.   05/02/2021 Stephanie [name] is a 73 y.o. female here for follow up for seronegative inflammatory arthritis on HCQ 300 mg daily and prednisone  5 mg daily. She has felt some gradual recovery of her shortness of breath over time. Pulmonology clinic testing all looked fine. She is taking the prednisone  most of the time, sometimes skipping doses and tends to notice increased pain starting with her ankles or plantar fasciitis after 2 or 3 days. She has been working with OT for about a month, so far no huge improvements but is slightly increasing wrist ROM.   01/03/21 Stephanie [name] is a 73 y.o. female here for follow up with seronegative inflammatory arthritis on HCQ 300 mg PO daily and tapering down prednisone  from 10 mg daily to 5  mg daily. Since our visit a series of studies starting from cardiac coronary CT scan showing liver lesions and abdominal CT showing multiple liver nodules possibly hemangiomata but is scheduled for MRI 10/29. She has experienced some abdominal and central facial swelling with the ongoing prednisone  use. She continues to have shortness of breath without cause seen on chest imaging or heart imaging so far.    07/11/20 Stephanie [name] is a 73 y.o. female here for evaluation of positive ANA with fatigue and joint pain symptoms. She has increased pain in her bilateral hands and wrists for about 6 months with no known preceding health events or changes. She hs a prior history of osteoarthritis chronically affecting her fingers especially base of the thumb but not this current problem. She has previously experienced carpal tunnel syndrome when working with sewing but does not think this is similar. She notices improvement  with NSAIDs but she cannot tolerate taking high doses of these or multiple times per day due to abdominal and epigastric pain and diarrhea. She does not notice much swelling, warmth or discoloration of the affected areas. Workup in primary care office demonstrated a positive ANA and negative RF test. She denies noticing other new health problems such as hair loss, oral ulcers, lymphadenopathy, raynaud's phenomenon, rashes, or fevers.   Review of Systems  Constitutional:  Negative for fatigue.  HENT:  Positive for mouth dryness. Negative for mouth sores.   Eyes:  Negative for dryness.  Respiratory:  Negative for shortness of breath.   Cardiovascular:  Negative for chest pain and palpitations.  Gastrointestinal:  Positive for constipation and diarrhea. Negative for blood in stool.  Endocrine: Negative for increased urination.  Genitourinary:  Negative for involuntary urination.  Musculoskeletal:  Positive for joint swelling and muscle weakness. Negative for joint pain, gait problem, joint pain,  myalgias, morning stiffness, muscle tenderness and myalgias.  Skin:  Negative for color change, rash, hair loss and sensitivity to sunlight.  Allergic/Immunologic: Negative for susceptible to infections.  Neurological:  Positive for headaches. Negative for dizziness.  Hematological:  Negative for swollen glands.  Psychiatric/Behavioral:  Positive for sleep disturbance. Negative for depressed mood. The patient is not nervous/anxious.     PMFS History:  Patient Active Problem List   Diagnosis Date Noted   Vitamin D  deficiency 10/22/2023   Elevated BP without diagnosis of hypertension 01/21/2023   Preventative health care 04/25/2022   SOB (shortness of breath) 09/25/2020   Inflammatory arthritis 08/09/2020   Bilateral wrist pain 07/11/2020   Fatigue 06/02/2020   Dizziness 06/02/2020   Arthralgia 06/02/2020   Palpitations 06/02/2020   Basal cell carcinoma (BCC) of forehead 04/24/2020   Lactose intolerance    Snoring 05/16/2018   OSA (obstructive sleep apnea) 05/16/2018   RLS (restless legs syndrome) 01/26/2018   Insomnia 01/26/2018   Snores 01/26/2018   Hx of colonic polyps 08/20/2016   BACK PAIN 02/29/2008   Hyperlipidemia 04/23/2007   Osteoarthritis 04/23/2007   DISC DISEASE, LUMBAR 04/23/2007    Past Medical History:  Diagnosis Date   Arthritis    Cataract    bilateral sx   Complication of anesthesia    Per pt, hard to wake up past c-section/ 1 time.   DDD (degenerative disc disease)    in feet and hands   GERD (gastroesophageal reflux disease)    OTC PRN meds   Hx of colonic polyps 08/20/2016   Hyperlipidemia    on meds   Lactose intolerance    Osteopenia    Rheumatoid arthritis (HCC)    Seasonal allergies    Sleep apnea    does not use CPAP   Wrist sprain     Family History  Problem Relation Age of Onset   Hypertension Mother    Dementia Mother    Arthritis Mother    Alzheimer's disease Mother    Arthritis Father    Liver disease Father        NASH    Hypertension Father    Arthritis Sister    Liver disease Sister    Arthritis Brother    Vision loss Brother    Liver disease Brother    Healthy Daughter    Healthy Son    Liver disease Paternal Aunt    Liver disease Paternal Aunt    Liver disease Paternal Aunt    Cystic fibrosis Other  niece   Colon cancer Neg Hx    Colon polyps Neg Hx    Esophageal cancer Neg Hx    Rectal cancer Neg Hx    Stomach cancer Neg Hx    Past Surgical History:  Procedure Laterality Date   CATARACT EXTRACTION Bilateral 2021   Dr. Cleatus   CESAREAN SECTION     1980, 1982   COLONOSCOPY  2018   CG-MAC-Miralax (exc)-polyps   POLYPECTOMY  2018   polyps   TOTAL ABDOMINAL HYSTERECTOMY W/ BILATERAL SALPINGOOPHORECTOMY     Social History   Social History Narrative   Married, 1 son and 1 daughter   Self-employed   2 caffienated beverages/day   Immunization History  Administered Date(s) Administered   Fluad Quad(high Dose 65+) 03/29/2021   Fluad Trivalent(High Dose 65+) 01/21/2023   Influenza, High Dose Seasonal PF 01/23/2018   Influenza,inj,Quad PF,6+ Mos 12/08/2012   Influenza-Unspecified 12/31/2019   PFIZER(Purple Top)SARS-COV-2 Vaccination 04/16/2019, 05/12/2019, 12/31/2019   Pneumococcal Conjugate-13 05/06/2017   Pneumococcal Polysaccharide-23 01/04/2020   Td 06/28/2004   Tdap 05/12/2012   Zoster, Live 05/12/2012     Objective: Vital Signs: BP 123/73 (BP Location: Left Arm, Patient Position: Sitting, Cuff Size: Normal)   Pulse 76   Resp 16   Ht 5' 3 (1.6 m)   Wt 154 lb 3.2 oz (69.9 kg)   BMI 27.32 kg/m    Physical Exam HENT:     Mouth/Throat:     Mouth: Mucous membranes are moist.     Pharynx: Oropharynx is clear.  Eyes:     Conjunctiva/sclera: Conjunctivae normal.  Cardiovascular:     Rate and Rhythm: Normal rate and regular rhythm.  Pulmonary:     Effort: Pulmonary effort is normal.     Breath sounds: Normal breath sounds.  Musculoskeletal:     Right lower leg: No  edema.     Left lower leg: No edema.  Lymphadenopathy:     Cervical: No cervical adenopathy.  Skin:    General: Skin is warm and dry.  Neurological:     Mental Status: She is alert.  Psychiatric:        Mood and Affect: Mood normal.      Musculoskeletal Exam:  Shoulders full ROM right side slightly painful with full internal rotation while raised horizontal or with full overhead abduction, no focal tenderness to pressure or palpable swelling, no pain radiation Elbows full ROM no tenderness or swelling Wrists full ROM no tenderness or swelling Fingers full ROM mild chronic 2nd-3rd MCP joint widening with no palpable swelling and full flexion range of motion Knees full ROM no tenderness or swelling Ankles full ROM no tenderness or swelling  Investigation: No additional findings.  Imaging: No results found.  Recent Labs: Lab Results  Component Value Date   WBC 5.2 04/29/2023   HGB 13.5 04/29/2023   PLT 222.0 04/29/2023   NA 140 04/29/2023   K 4.9 04/29/2023   CL 106 04/29/2023   CO2 25 04/29/2023   GLUCOSE 96 04/29/2023   BUN 18 04/29/2023   CREATININE 0.75 04/29/2023   BILITOT 0.8 04/29/2023   ALKPHOS 66 04/29/2023   AST 23 04/29/2023   ALT 22 04/29/2023   PROT 6.7 04/29/2023   ALBUMIN 4.3 04/29/2023   CALCIUM  9.2 04/29/2023    Speciality Comments: No specialty comments available.  Procedures:  No procedures performed Allergies: Penicillins and Neosporin [bacitracin-polymyxin b]   Assessment / Plan:     Visit Diagnoses: Inflammatory arthritis - Cymbalta  to  20mg  daily - Plan: hydroxychloroquine  (PLAQUENIL ) 200 MG tablet, diclofenac  (VOLTAREN ) 75 MG EC tablet, Sedimentation rate, C-reactive protein, C3 and C4, Anti-DNA antibody, double-stranded, Centromere Antibodies Joint pain reasonably well-controlled on current regiment with Plaquenil  diclofenac  and Cymbalta  at decreased dose.  No peripheral joint synovitis or dactylitis on exam. - Checking sed rate CRP  complements double-stranded ENA anticentromere antibodies reassessing for disease activity - Continue hydroxychloroquine  200 mg daily - Continue diclofenac  75 mg daily - Continue Cymbalta  20 mg daily  High risk medication use - Diclofenac  75 mg once daily. Hydroxychloroquine  200 mg daily, needs a PLQ eye exam. - Plan: CBC with Differential/Platelet, Comprehensive metabolic panel with GFR Has been tolerating medication well without specific side effects.  No serious interval infections. - Do not currently have up-to-date retinal toxicity screening exam on file so we will need to reach out for record of this.  - Checking CBC and CMP for medication monitoring on continued long-term use of hydroxychloroquine  and diclofenac .  Vitamin D  deficiency - Plan: VITAMIN D  25 Hydroxy (Vit-D Deficiency, Fractures) Last vitamin D  level checked was a few years ago.  With increasing generalized symptoms especially brain fog will recheck vitamins for any metabolic contribution. - Rechecking serum vitamin D   Right shoulder pain Shoulder discomfort with grinding sensation, good range of motion, no significant osteoarthritis.  Seems more consistent for bursitis or rotator cuff arthropathy, possibly with mild secondary impingement syndrome.  Likely secondary impingement due to supraspinatus tendon involvement. - Provide exercises for shoulder rehabilitation and strengthening.   Orders: Orders Placed This Encounter  Procedures   Sedimentation rate   C-reactive protein   C3 and C4   CBC with Differential/Platelet   Comprehensive metabolic panel with GFR   VITAMIN D  25 Hydroxy (Vit-D Deficiency, Fractures)   Anti-DNA antibody, double-stranded   Centromere Antibodies   Meds ordered this encounter  Medications   DULoxetine  (CYMBALTA ) 20 MG capsule    Sig: Take 1 capsule (20 mg total) by mouth daily.    Dispense:  90 capsule    Refill:  0   hydroxychloroquine  (PLAQUENIL ) 200 MG tablet    Sig: Take 1 tablet  (200 mg total) by mouth daily.    Dispense:  90 tablet    Refill:  0   diclofenac  (VOLTAREN ) 75 MG EC tablet    Sig: Take 1 tablet (75 mg total) by mouth daily.    Dispense:  90 tablet    Refill:  0     Follow-Up Instructions: Return in about 6 months (around 04/23/2024) for RA on HCQ/NSAID/CYM f/u 6mos.   Lonni LELON Ester, MD  Note - This record has been created using [employer].  Chart creation errors have been sought, but may not always  have been located. Such creation errors do not reflect on  the standard of medical care.

## 2023-10-22 ENCOUNTER — Ambulatory Visit: Payer: PPO | Attending: Internal Medicine | Admitting: Internal Medicine

## 2023-10-22 ENCOUNTER — Encounter: Payer: Self-pay | Admitting: Internal Medicine

## 2023-10-22 VITALS — BP 123/73 | HR 76 | Resp 16 | Ht 63.0 in | Wt 154.2 lb

## 2023-10-22 DIAGNOSIS — M1991 Primary osteoarthritis, unspecified site: Secondary | ICD-10-CM | POA: Diagnosis not present

## 2023-10-22 DIAGNOSIS — M1990 Unspecified osteoarthritis, unspecified site: Secondary | ICD-10-CM

## 2023-10-22 DIAGNOSIS — Z79899 Other long term (current) drug therapy: Secondary | ICD-10-CM

## 2023-10-22 DIAGNOSIS — E559 Vitamin D deficiency, unspecified: Secondary | ICD-10-CM | POA: Diagnosis not present

## 2023-10-22 MED ORDER — DICLOFENAC SODIUM 75 MG PO TBEC: 75.0000 mg | DELAYED_RELEASE_TABLET | Freq: Every day | ORAL | 0 refills | Status: DC

## 2023-10-22 MED ORDER — HYDROXYCHLOROQUINE SULFATE 200 MG PO TABS: 200.0000 mg | ORAL_TABLET | Freq: Every day | ORAL | 0 refills | Status: AC

## 2023-10-22 MED ORDER — DULOXETINE HCL 20 MG PO CPEP: 20.0000 mg | ORAL_CAPSULE | Freq: Every day | ORAL | 0 refills | Status: DC

## 2023-10-23 ENCOUNTER — Other Ambulatory Visit: Payer: Self-pay | Admitting: Family Medicine

## 2023-10-23 DIAGNOSIS — E785 Hyperlipidemia, unspecified: Secondary | ICD-10-CM

## 2023-10-23 LAB — C-REACTIVE PROTEIN: CRP: <3 mg/L (ref <8.0)

## 2023-10-23 LAB — CBC WITH DIFFERENTIAL/PLATELET
Absolute Lymphocytes: 1410 {cells}/uL (ref 850–3900)
Absolute Monocytes: 461 {cells}/uL (ref 200–950)
Basophils Absolute: 53 {cells}/uL (ref 0–200)
Basophils Relative: 1 %
Eosinophils Absolute: 143 {cells}/uL (ref 15–500)
Eosinophils Relative: 2.7 %
HCT: 41.9 % (ref 35.0–45.0)
Hemoglobin: 13.7 g/dL (ref 11.7–15.5)
MCH: 31.3 pg (ref 27.0–33.0)
MCHC: 32.7 g/dL (ref 32.0–36.0)
MCV: 95.7 fL (ref 80.0–100.0)
MPV: 11.2 fL (ref 7.5–12.5)
Monocytes Relative: 8.7 %
Neutro Abs: 3233 {cells}/uL (ref 1500–7800)
Neutrophils Relative %: 61 %
Platelets: 222 Thousand/uL (ref 140–400)
RBC: 4.38 Million/uL (ref 3.80–5.10)
RDW: 11.7 % (ref 11.0–15.0)
Total Lymphocyte: 26.6 %
WBC: 5.3 Thousand/uL (ref 3.8–10.8)

## 2023-10-23 LAB — COMPREHENSIVE METABOLIC PANEL WITH GFR
AG Ratio: 1.8 (calc) (ref 1.0–2.5)
ALT: 18 U/L (ref 6–29)
AST: 21 U/L (ref 10–35)
Albumin: 4.4 g/dL (ref 3.6–5.1)
Alkaline phosphatase (APISO): 72 U/L (ref 37–153)
BUN: 13 mg/dL (ref 7–25)
CO2: 25 mmol/L (ref 20–32)
Calcium: 9.8 mg/dL (ref 8.6–10.4)
Chloride: 106 mmol/L (ref 98–110)
Creat: 0.84 mg/dL (ref 0.60–1.00)
Globulin: 2.5 g/dL (ref 1.9–3.7)
Glucose, Bld: 100 mg/dL — ABNORMAL HIGH (ref 65–99)
Potassium: 5.6 mmol/L — ABNORMAL HIGH (ref 3.5–5.3)
Sodium: 140 mmol/L (ref 135–146)
Total Bilirubin: 0.7 mg/dL (ref 0.2–1.2)
Total Protein: 6.9 g/dL (ref 6.1–8.1)
eGFR: 74 mL/min/1.73m2 (ref >=60)

## 2023-10-23 LAB — CENTROMERE ANTIBODIES: Centromere Ab Screen: 2.5 AI — AB

## 2023-10-23 LAB — C3 AND C4
C3 Complement: 160 mg/dL (ref 83–193)
C4 Complement: 33 mg/dL (ref 15–57)

## 2023-10-23 LAB — ANTI-DNA ANTIBODY, DOUBLE-STRANDED: ds DNA Ab: 3 [IU]/mL

## 2023-10-23 LAB — SEDIMENTATION RATE: Sed Rate: 6 mm/h (ref 0–30)

## 2023-10-23 LAB — VITAMIN D 25 HYDROXY (VIT D DEFICIENCY, FRACTURES): Vit D, 25-Hydroxy: 31 ng/mL (ref 30–100)

## 2023-11-06 ENCOUNTER — Encounter: Payer: Self-pay | Admitting: Pharmacist

## 2023-11-06 NOTE — Progress Notes (Signed)
 Pharmacy Quality Measure Review  This patient is appearing on a report for being at risk of failing the adherence measure for cholesterol (statin) medications this calendar year.   Medication: atorvastatin   Last fill date: 06/23/2023 for 90 day supply  Reviewed recent refill history in Dr Annemarie database. Actual last refill date was 10/23/2023 for 30 day supply. Patient has no refills remaining. Next appointment with PCP is not currently scheduled but last visit was 04/29/2023 for CPE - Dr Antonio GLENWOOD Meth notes recommend RTC in 6 months.  CMA that refill last atorvastatin  did send patient a message to make an appointment with PCP but patient is questioning if she needs appointment since she just had labs check with her rheumatologist - Dr Jeannetta.  Will check with PCP about recommendation for follow up. See refill request from 10/23/2023 - forwarded to PCP.   SABRAtbes

## 2023-11-06 NOTE — Telephone Encounter (Signed)
 Forwarding to PCP to review for recommended follow up.

## 2023-11-24 ENCOUNTER — Other Ambulatory Visit: Payer: Self-pay | Admitting: Pharmacist

## 2023-11-24 ENCOUNTER — Encounter: Payer: Self-pay | Admitting: Pharmacist

## 2023-11-24 DIAGNOSIS — E785 Hyperlipidemia, unspecified: Secondary | ICD-10-CM

## 2023-11-24 MED ORDER — ATORVASTATIN CALCIUM 20 MG PO TABS: 20.0000 mg | ORAL_TABLET | Freq: Every day | ORAL | 0 refills | Status: DC

## 2023-11-24 NOTE — Progress Notes (Signed)
 Pharmacy Quality Measure Review  This patient is appearing on a report for being at risk of failing the adherence measure for cholesterol (statin) medications this calendar year.   Medication: atorvastatin   Last fill date: 10/23/2023 for 30 day supply  Patient NTBS by provider.  Sent in Rx for 30 days and forwarded request to Dr Rosalba scheduler to make a follow up appointment for cholesterol / labs  Madelin Ray, PharmD Clinical Pharmacist [facility]

## 2023-11-24 NOTE — Telephone Encounter (Signed)
Left voicemail to schedule f.u

## 2023-11-24 NOTE — Telephone Encounter (Signed)
 Approved 30 day of refills. Patient is due to see PCP. Sent message to Dr Rosalba scheduler (see note from 11/06/2023)

## 2023-12-08 ENCOUNTER — Encounter: Payer: Self-pay | Admitting: Pharmacist

## 2024-01-08 ENCOUNTER — Telehealth: Payer: Self-pay | Admitting: Pharmacist

## 2024-01-08 DIAGNOSIS — E785 Hyperlipidemia, unspecified: Secondary | ICD-10-CM

## 2024-01-08 MED ORDER — ATORVASTATIN CALCIUM 20 MG PO TABS: 20.0000 mg | ORAL_TABLET | Freq: Every day | ORAL | 0 refills | Status: DC

## 2024-01-08 NOTE — Progress Notes (Signed)
 Pharmacy Quality Measure Review  This patient is appearing on a report for being at risk of failing the adherence measure for cholesterol (statin) medications this calendar year.   Medication: atorvastatin   Last fill date: 11/23/2023 for 30 day supply  Patient NTBS by provider. Spoke to patient today - make follow up appointment for 01/13/2024 at 10:20am.  Sent in Rx for 30 days.   Madelin Ray, PharmD Clinical Pharmacist [facility]

## 2024-01-13 ENCOUNTER — Ambulatory Visit: Admitting: Family Medicine

## 2024-01-13 ENCOUNTER — Ambulatory Visit: Payer: Self-pay | Admitting: Family Medicine

## 2024-01-13 ENCOUNTER — Encounter: Payer: Self-pay | Admitting: Family Medicine

## 2024-01-13 VITALS — BP 138/88 | HR 61 | Temp 97.6°F | Resp 16 | Ht 63.0 in | Wt 150.8 lb

## 2024-01-13 DIAGNOSIS — G2581 Restless legs syndrome: Secondary | ICD-10-CM | POA: Diagnosis not present

## 2024-01-13 DIAGNOSIS — E785 Hyperlipidemia, unspecified: Secondary | ICD-10-CM

## 2024-01-13 LAB — LIPID PANEL
Cholesterol: 215 mg/dL — ABNORMAL HIGH (ref 0–200)
HDL: 83.1 mg/dL (ref >39.00)
LDL Cholesterol: 115 mg/dL — ABNORMAL HIGH (ref 0–99)
NonHDL: 131.76
Total CHOL/HDL Ratio: 3
Triglycerides: 86 mg/dL (ref 0.0–149.0)
VLDL: 17.2 mg/dL (ref 0.0–40.0)

## 2024-01-13 LAB — COMPREHENSIVE METABOLIC PANEL WITH GFR
ALT: 13 U/L (ref 0–35)
AST: 15 U/L (ref 0–37)
Albumin: 4.2 g/dL (ref 3.5–5.2)
Alkaline Phosphatase: 77 U/L (ref 39–117)
BUN: 16 mg/dL (ref 6–23)
CO2: 25 meq/L (ref 19–32)
Calcium: 9.4 mg/dL (ref 8.4–10.5)
Chloride: 103 meq/L (ref 96–112)
Creatinine, Ser: 0.73 mg/dL (ref 0.40–1.20)
GFR: 82.5 mL/min (ref >60.00)
Glucose, Bld: 90 mg/dL (ref 70–99)
Potassium: 4.4 meq/L (ref 3.5–5.1)
Sodium: 139 meq/L (ref 135–145)
Total Bilirubin: 0.8 mg/dL (ref 0.2–1.2)
Total Protein: 6.5 g/dL (ref 6.0–8.3)

## 2024-01-13 LAB — CBC WITH DIFFERENTIAL/PLATELET
Basophils Absolute: 0 K/uL (ref 0.0–0.1)
Basophils Relative: 0.6 % (ref 0.0–3.0)
Eosinophils Absolute: 0.2 K/uL (ref 0.0–0.7)
Eosinophils Relative: 2.7 % (ref 0.0–5.0)
HCT: 40.5 % (ref 36.0–46.0)
Hemoglobin: 13.4 g/dL (ref 12.0–15.0)
Lymphocytes Relative: 31.2 % (ref 12.0–46.0)
Lymphs Abs: 1.9 K/uL (ref 0.7–4.0)
MCHC: 33.1 g/dL (ref 30.0–36.0)
MCV: 93.7 fl (ref 78.0–100.0)
Monocytes Absolute: 0.6 K/uL (ref 0.1–1.0)
Monocytes Relative: 10.3 % (ref 3.0–12.0)
Neutro Abs: 3.4 K/uL (ref 1.4–7.7)
Neutrophils Relative %: 55.2 % (ref 43.0–77.0)
Platelets: 208 K/uL (ref 150.0–400.0)
RBC: 4.32 Mil/uL (ref 3.87–5.11)
RDW: 13 % (ref 11.5–15.5)
WBC: 6.2 K/uL (ref 4.0–10.5)

## 2024-01-13 MED ORDER — ROPINIROLE HCL 1 MG PO TABS: ORAL_TABLET | ORAL | 3 refills | Status: AC

## 2024-01-13 NOTE — Assessment & Plan Note (Signed)
 Tolerating statin, encouraged heart healthy diet, avoid trans fats, minimize simple carbs and saturated fats. Increase exercise as tolerated

## 2024-01-13 NOTE — Progress Notes (Signed)
 Subjective:    Patient ID: Stephanie [name], female    DOB: [birth year], 73 y.o.   MRN: [id number]  Chief Complaint  Patient presents with   Hyperlipidemia   Follow-up    HPI Patient is in today for f/u chol.  Discussed the use of AI scribe software for clinical note transcription with the patient, who gave verbal consent to proceed.  History of Present Illness Stephanie [name] is a 73 year old female who presents for a cholesterol check-up.  She had a previous visit to a rheumatologist about two months ago where extensive blood work was performed, costing approximately $1100. She has not received follow-up communication regarding the results, although she noted that only a couple of things were slightly abnormal.  She takes Requip , with a dosage of four pills once daily, splitting the dose to two pills at 6:30 AM and two at 10:00 AM to avoid excessive drowsiness. Taking all four pills at once causes her to fall asleep early in the evening. She inquires about the safety of taking additional doses when traveling or attending events to prevent restlessness.  She has been taking Vitamin D3 at a dose of 2000 IU daily after noticing her levels were low normal. Magnesium did not help with her cramps and jumpiness, but she has been feeling well lately with good energy levels.  She experiences occasional odd pains in her feet but not for long periods. No recent flu or COVID vaccinations. She has been actively using a chainsaw for yard work, cutting [employer], and reports no issues with breathing.  She had cataract surgery in 2021 and reports improved vision, although she wears prescription glasses for night driving. She continues to work in various capacities, including as a arboriculturist, software engineer, and neurosurgeon.  She discusses her experience with electric vehicles, mentioning a previous [employer]ar that was recalled and a current Mercedes SUV, noting the vehicle's range and charging  capabilities, particularly when traveling to the mountains.    Past Medical History:  Diagnosis Date   Arthritis    Cataract    bilateral sx   Complication of anesthesia    Per pt, hard to wake up past c-section/ 1 time.   DDD (degenerative disc disease)    in feet and hands   GERD (gastroesophageal reflux disease)    OTC PRN meds   Hx of colonic polyps 08/20/2016   Hyperlipidemia    on meds   Lactose intolerance    Osteopenia    Rheumatoid arthritis (HCC)    Seasonal allergies    Sleep apnea    does not use CPAP   Wrist sprain     Past Surgical History:  Procedure Laterality Date   CATARACT EXTRACTION Bilateral 2021   Dr. Cleatus   CESAREAN SECTION     1980, 1982   COLONOSCOPY  2018   CG-MAC-Miralax (exc)-polyps   POLYPECTOMY  2018   polyps   TOTAL ABDOMINAL HYSTERECTOMY W/ BILATERAL SALPINGOOPHORECTOMY      Family History  Problem Relation Age of Onset   Hypertension Mother    Dementia Mother    Arthritis Mother    Alzheimer's disease Mother    Arthritis Father    Liver disease Father        NASH   Hypertension Father    Arthritis Sister    Liver disease Sister    Arthritis Brother    Vision loss Brother    Liver disease Brother    Healthy Daughter  Healthy Son    Liver disease Paternal Aunt    Liver disease Paternal Aunt    Liver disease Paternal Aunt    Cystic fibrosis Other        niece   Colon cancer Neg Hx    Colon polyps Neg Hx    Esophageal cancer Neg Hx    Rectal cancer Neg Hx    Stomach cancer Neg Hx     Social History   Socioeconomic History   Marital status: Married    Spouse name: Not on file   Number of children: 2   Years of education: Not on file   Highest education level: Not on file  Occupational History   Occupation: self  Tobacco Use   Smoking status: Never    Passive exposure: Never   Smokeless tobacco: Never  Vaping Use   Vaping status: Never Used  Substance and Sexual Activity   Alcohol use: Yes     Alcohol/week: 7.0 standard drinks of alcohol    Types: 7 Standard drinks or equivalent per week   Drug use: No   Sexual activity: Yes    Partners: Male  Other Topics Concern   Not on file  Social History Narrative   Married, 1 son and 1 daughter   Self-employed   2 caffienated beverages/day   Social Drivers of Health   Financial Resource Strain: Low Risk  (04/24/2021)   Overall Financial Resource Strain (CARDIA)    Difficulty of Paying Living Expenses: Not hard at all  Food Insecurity: No Food Insecurity (04/24/2021)   Hunger Vital Sign    Worried About Running Out of Food in the Last Year: Never true    Ran Out of Food in the Last Year: Never true  Transportation Needs: No Transportation Needs (04/24/2021)   PRAPARE - Administrator, Civil Service (Medical): No    Lack of Transportation (Non-Medical): No  Physical Activity: Inactive (04/24/2021)   Exercise Vital Sign    Days of Exercise per Week: 0 days    Minutes of Exercise per Session: 0 min  Stress: No Stress Concern Present (04/24/2021)   [employer] of Occupational Health - Occupational Stress Questionnaire    Feeling of Stress : Not at all  Social Connections: Socially Integrated (04/24/2021)   Social Connection and Isolation Panel    Frequency of Communication with Friends and Family: Twice a week    Frequency of Social Gatherings with Friends and Family: More than three times a week    Attends Religious Services: 1 to 4 times per year    Active Member of [employer] or Organizations: Yes    Attends Engineer, Structural: More than 4 times per year    Marital Status: Married  Catering Manager Violence: Not At Risk (04/24/2021)   Humiliation, Afraid, Rape, and Kick questionnaire    Fear of Current or Ex-Partner: No    Emotionally Abused: No    Physically Abused: No    Sexually Abused: No    Outpatient Medications Prior to Visit  Medication Sig Dispense Refill   atorvastatin  (LIPITOR) 20 MG tablet  Take 1 tablet (20 mg total) by mouth daily. 30 tablet 0   CALCIUM  PO Take 1 tablet by mouth daily as needed.     diclofenac  (VOLTAREN ) 75 MG EC tablet Take 1 tablet (75 mg total) by mouth daily. 90 tablet 0   doxylamine, Sleep, (UNISOM) 25 MG tablet Take 25 mg by mouth at bedtime.  DULoxetine  (CYMBALTA ) 20 MG capsule Take 1 capsule (20 mg total) by mouth daily. 90 capsule 0   hydroxychloroquine  (PLAQUENIL ) 200 MG tablet Take 1 tablet (200 mg total) by mouth daily. 90 tablet 0   traMADol  (ULTRAM ) 50 MG tablet Take by mouth every 6 (six) hours as needed.     rOPINIRole  (REQUIP ) 0.5 MG tablet TAKE 4 TABLETS BY MOUTH ONCE DAILY 360 tablet 2   MAGNESIUM PO Take by mouth. (Patient not taking: Reported on 01/13/2024)     NAPROXEN PO Take by mouth. (Patient not taking: Reported on 01/13/2024)     No facility-administered medications prior to visit.    Allergies  Allergen Reactions   Penicillins Anaphylaxis   Neosporin [Bacitracin-Polymyxin B] Dermatitis    Review of Systems  Constitutional:  Negative for chills, fever and malaise/fatigue.  HENT:  Negative for congestion and hearing loss.   Eyes:  Negative for blurred vision and discharge.  Respiratory:  Negative for cough, sputum production and shortness of breath.   Cardiovascular:  Negative for chest pain, palpitations and leg swelling.  Gastrointestinal:  Negative for abdominal pain, blood in stool, constipation, diarrhea, heartburn, nausea and vomiting.  Genitourinary:  Negative for dysuria, frequency, hematuria and urgency.  Musculoskeletal:  Negative for back pain, falls and myalgias.  Skin:  Negative for rash.  Neurological:  Negative for dizziness, sensory change, loss of consciousness, weakness and headaches.  Endo/Heme/Allergies:  Negative for environmental allergies. Does not bruise/bleed easily.  Psychiatric/Behavioral:  Negative for depression and suicidal ideas. The patient is not nervous/anxious and does not have insomnia.         Objective:    Physical Exam Vitals and nursing note reviewed.  Constitutional:      General: She is not in acute distress.    Appearance: Normal appearance. She is well-developed.  HENT:     Head: Normocephalic and atraumatic.  Eyes:     General: No scleral icterus.       Right eye: No discharge.        Left eye: No discharge.  Cardiovascular:     Rate and Rhythm: Normal rate and regular rhythm.     Heart sounds: No murmur heard. Pulmonary:     Effort: Pulmonary effort is normal. No respiratory distress.     Breath sounds: Normal breath sounds.  Musculoskeletal:        General: Normal range of motion.     Cervical back: Normal range of motion and neck supple.     Right lower leg: No edema.     Left lower leg: No edema.  Skin:    General: Skin is warm and dry.  Neurological:     Mental Status: She is alert and oriented to person, place, and time.  Psychiatric:        Mood and Affect: Mood normal.        Behavior: Behavior normal.        Thought Content: Thought content normal.        Judgment: Judgment normal.     BP 138/88 (BP Location: Right Arm, Patient Position: Sitting, Cuff Size: Normal)   Pulse 61   Temp 97.6 F (36.4 C) (Oral)   Resp 16   Ht 5' 3 (1.6 m)   Wt 150 lb 12.8 oz (68.4 kg)   SpO2 100%   BMI 26.71 kg/m  Wt Readings from Last 3 Encounters:  01/13/24 150 lb 12.8 oz (68.4 kg)  10/22/23 154 lb 3.2 oz (69.9 kg)  04/29/23  160 lb 12.8 oz (72.9 kg)    Diabetic Foot Exam - Simple   No data filed    Lab Results  Component Value Date   WBC 5.3 10/22/2023   HGB 13.7 10/22/2023   HCT 41.9 10/22/2023   PLT 222 10/22/2023   GLUCOSE 100 (H) 10/22/2023   CHOL 187 04/29/2023   TRIG 87.0 04/29/2023   HDL 87.70 04/29/2023   LDLDIRECT 103.2 10/15/2011   LDLCALC 82 04/29/2023   ALT 18 10/22/2023   AST 21 10/22/2023   NA 140 10/22/2023   K 5.6 (H) 10/22/2023   CL 106 10/22/2023   CREATININE 0.84 10/22/2023   BUN 13 10/22/2023   CO2 25  10/22/2023   TSH 3.27 04/29/2023    Lab Results  Component Value Date   TSH 3.27 04/29/2023   Lab Results  Component Value Date   WBC 5.3 10/22/2023   HGB 13.7 10/22/2023   HCT 41.9 10/22/2023   MCV 95.7 10/22/2023   PLT 222 10/22/2023   Lab Results  Component Value Date   NA 140 10/22/2023   K 5.6 (H) 10/22/2023   CO2 25 10/22/2023   GLUCOSE 100 (H) 10/22/2023   BUN 13 10/22/2023   CREATININE 0.84 10/22/2023   BILITOT 0.7 10/22/2023   ALKPHOS 66 04/29/2023   AST 21 10/22/2023   ALT 18 10/22/2023   PROT 6.9 10/22/2023   ALBUMIN 4.3 04/29/2023   CALCIUM  9.8 10/22/2023   EGFR 74 10/22/2023   GFR 80.27 04/29/2023   Lab Results  Component Value Date   CHOL 187 04/29/2023   Lab Results  Component Value Date   HDL 87.70 04/29/2023   Lab Results  Component Value Date   LDLCALC 82 04/29/2023   Lab Results  Component Value Date   TRIG 87.0 04/29/2023   Lab Results  Component Value Date   CHOLHDL 2 04/29/2023   No results found for: HGBA1C     Assessment & Plan:  Hyperlipidemia, unspecified hyperlipidemia type Assessment & Plan: Tolerating statin, encouraged heart healthy diet, avoid trans fats, minimize simple carbs and saturated fats. Increase exercise as tolerated   Orders: -     CBC with Differential/Platelet -     Comprehensive metabolic panel with GFR -     Lipid panel  RLS (restless legs syndrome) Assessment & Plan: Stable  On requip   Orders: -     rOPINIRole  HCl; 1 po bid  Dispense: 180 tablet; Refill: 3    Jamee JONELLE Antonio Cyndee, DO

## 2024-01-13 NOTE — Assessment & Plan Note (Signed)
 Stable  On requip 

## 2024-01-19 ENCOUNTER — Telehealth: Payer: Self-pay | Admitting: Family Medicine

## 2024-01-19 NOTE — Telephone Encounter (Signed)
 Copied from CRM [id number]. Topic: Medicare AWV >> Jan 19, 2024 10:44 AM Nathanel DEL wrote: Called LVM 01/19/2024 to schedule AWV. Please schedule office or virtual visits  Nathanel Paschal; Care Guide Ambulatory Clinical Support [place] [facility] Direct Dial: [phone]

## 2024-02-07 ENCOUNTER — Telehealth: Payer: Self-pay | Admitting: Pharmacist

## 2024-02-07 DIAGNOSIS — E785 Hyperlipidemia, unspecified: Secondary | ICD-10-CM

## 2024-02-07 MED ORDER — ATORVASTATIN CALCIUM 20 MG PO TABS: 20.0000 mg | ORAL_TABLET | Freq: Every day | ORAL | 1 refills | Status: AC

## 2024-02-07 NOTE — Progress Notes (Signed)
 Pharmacy Quality Measure Review  This patient is appearing on a report for being at risk of failing the adherence measure for cholesterol (statin) medications this calendar year.   Medication: atorvastatin   Last fill date: 01/08/2024 for 30 day supply  Patient was recently seen by her PCP - 01/13/2024. No change in atorvastatin  was noted.  She has no refills remaining on her atorvastatin  prescription. Sent in updated Rx to her pharmacy.    Madelin Ray, PharmD Clinical Pharmacist [facility]

## 2024-04-09 ENCOUNTER — Other Ambulatory Visit: Payer: Self-pay | Admitting: Internal Medicine

## 2024-04-09 DIAGNOSIS — M1380 Other specified arthritis, unspecified site: Secondary | ICD-10-CM

## 2024-04-09 DIAGNOSIS — M1991 Primary osteoarthritis, unspecified site: Secondary | ICD-10-CM

## 2024-04-09 NOTE — Telephone Encounter (Signed)
 Last Fill: 10/22/2023  Labs: 01/13/2024  Next Visit: due 04/2024, message sent to the front desk to schedule.   Last Visit: 10/22/2023  DX: Inflammatory arthritis   Current Dose per office note on 10/22/2023: - Continue diclofenac  75 mg daily - Continue Cymbalta  20 mg daily  Okay to refill Diclofenac  and cymbalta ?

## 2024-06-07 ENCOUNTER — Ambulatory Visit: Admitting: Internal Medicine
